# Patient Record
Sex: Female | Born: 2000
Health system: Southern US, Community
[De-identification: ages and names within clinical notes are randomized; demographics above are authoritative.]

## PROBLEM LIST (undated history)

## (undated) DIAGNOSIS — K5900 Constipation, unspecified: Secondary | ICD-10-CM

## (undated) DIAGNOSIS — R7303 Prediabetes: Secondary | ICD-10-CM

## (undated) DIAGNOSIS — E230 Hypopituitarism: Secondary | ICD-10-CM

## (undated) DIAGNOSIS — M21372 Foot drop, left foot: Secondary | ICD-10-CM

## (undated) DIAGNOSIS — E079 Disorder of thyroid, unspecified: Secondary | ICD-10-CM

## (undated) HISTORY — DX: Hypopituitarism: E23.0

## (undated) HISTORY — DX: Constipation, unspecified: K59.00

## (undated) HISTORY — DX: Foot drop, left foot: M21.372

---
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
## 2018-01-17 DIAGNOSIS — E038 Other specified hypothyroidism: Secondary | ICD-10-CM | POA: Diagnosis not present

## 2018-02-11 DIAGNOSIS — E038 Other specified hypothyroidism: Secondary | ICD-10-CM | POA: Diagnosis not present

## 2018-02-11 DIAGNOSIS — Z8349 Family history of other endocrine, nutritional and metabolic diseases: Secondary | ICD-10-CM | POA: Diagnosis not present

## 2018-03-03 DIAGNOSIS — E038 Other specified hypothyroidism: Secondary | ICD-10-CM | POA: Diagnosis not present

## 2018-04-13 DIAGNOSIS — M21372 Foot drop, left foot: Secondary | ICD-10-CM | POA: Diagnosis not present

## 2018-05-13 ENCOUNTER — Ambulatory Visit: Payer: 59 | Admitting: Neurology

## 2018-05-13 ENCOUNTER — Encounter (INDEPENDENT_AMBULATORY_CARE_PROVIDER_SITE_OTHER): Payer: 59 | Admitting: Neurology

## 2018-05-13 DIAGNOSIS — Z0289 Encounter for other administrative examinations: Secondary | ICD-10-CM

## 2018-05-13 DIAGNOSIS — M21372 Foot drop, left foot: Secondary | ICD-10-CM | POA: Diagnosis not present

## 2018-05-13 NOTE — Procedures (Signed)
Full Name: Joyce MaltaCourtney  [name] Gender: Female MRN #: [id number] Date of Birth: Aug 04, 2001    Visit Date: 05/13/2018 08:14 Age: 17 Years 2 Months Old Examining Physician: Levert FeinsteinYijun Yeira Gulden, MD  Referring Physician: Kenard Gowerabdice Smith, MD History: 17 years old female presented with left foot drop, gradual onset since July 2019, few days of left she took off her left ankle brace for left foot pain, she noticed numbness at the top of her left foot extending to left lateral leg, then followed by left foot drop, tripping, over the past few weeks, she has some improvement, she denies significant low back pain, she tends to cross her leg.  On examination: Right lower extremity proximal and distal muscle strength was normal, left leg proximal muscle strength is normal, left ankle dorsiflexion 3, eversion 2, inversion 4+, left ankle plantarflexion 4+, sensory loss of the top of left foot, extending to left lateral leg, involving first webspace.  Deep tendon reflexes were brisk and symmetric bilaterally  Summary of the tests:  Nerve conduction study: Bilateral sural, right superficial peroneal sensory responses were normal.  Left superficial peroneal sensory responses showed normal peak latency, with significantly decreased the snap amplitude. Bilateral tibial, right peroneal to EDB motor responses were normal.  Left peroneal to EDB motor responses showed normal distal latency, normal C map amplitude at distal stimulation side, but significant amplitude drop at proximal stimulation site, below and above fibular head.  Left peroneal to tibialis anterior responses also showed moderately prolonged distal latency, with moderately decreased the C map amplitude, slow conduction velocity across left fibular head.  Electromyography: Selective needle examinations were performed at left lower extremity muscles in the left lumbosacral paraspinal muscles.  There was evidence of profound active neuropathic changes involving  left tibialis anterior, peroneal longus.  There is also evidence of mild chronic neuropathic changes involving left tibialis posterior, medial gastrocnemius, biceps femoris long head, short head.  There is no evidence of active denervation at left lumbosacral paraspinal muscles.  Conclusion:  This is an abnormal study.  There is electrodiagnostic evidence of left common peroneal neuropathy, likely across left fibular head, with evidence of axonal loss.   ------------------------------- Forrestine HimYijun Orlando Devereux M.D.PhD  [facility] [address] [place], [place] [address] Tel: [phone] Fax: [phone]        [facility]    Nerve / Sites Muscle Latency Ref. Amplitude Ref. Rel Amp Segments Distance Velocity Ref. Area    ms ms mV mV %  cm m/s m/s mVms  L Peroneal - EDB     Ankle EDB 5.6 ?6.5 2.2 ?2.0 100 Ankle - EDB 9   10.8     Fib head EDB 15.1  1.0  45.2 Fib head - Ankle 33 35 ?44 5.1     Pop fossa EDB 18.4  0.7  73.3 Pop fossa - Fib head 10 30 ?44 4.2         Pop fossa - Ankle      R Peroneal - EDB     Ankle EDB 5.2 ?6.5 4.3 ?2.0 100 Ankle - EDB 9   16.3     Fib head EDB 12.0  4.1  94.5 Fib head - Ankle 30 44 ?44 16.4     Pop fossa EDB 14.2  4.2  102 Pop fossa - Fib head 10 46 ?44 17.7         Pop fossa - Ankle      L Tibial - AH  Ankle AH 4.8 ?5.8 9.9 ?4.0 100 Ankle - AH 9   33.3     Pop fossa AH 14.6  8.5  85.7 Pop fossa - Ankle 40 41 ?41 31.9  R Tibial - AH     Ankle AH 5.3 ?5.8 9.2 ?4.0 100 Ankle - AH 9   31.5     Pop fossa AH 14.7  5.3  57.4 Pop fossa - Ankle 40 42 ?41 25.8  L Peroneal - Tib Ant     Fib Head Tib Ant 8.8 ?6.7 1.2 ?3.0 100 Fib Head - Tib Ant 10   12.9     Pop fossa Tib Ant 14.9  0.8  63 Pop fossa - Fib Head 10 16 ?44 5.0               SNC    Nerve / Sites Rec. Site Peak Lat Ref.  Amp Ref. Segments Distance    ms ms V V  cm  L Sural - Ankle (Calf)     Calf Ankle 4.2 ?4.4 8 ?6 Calf - Ankle 14  R Sural - Ankle (Calf)     Calf Ankle 3.6 ?4.4 11  ?6 Calf - Ankle 14  L Superficial peroneal - Ankle     Lat leg Ankle 4.2 ?4.4 2 ?6 Lat leg - Ankle 14  R Superficial peroneal - Ankle     Lat leg Ankle 4.1 ?4.4 8 ?6 Lat leg - Ankle 14             F  Wave    Nerve F Lat Ref.   ms ms  L Tibial - AH 52.9 ?56.0  R Tibial - AH 52.4 ?56.0         EMG full       EMG Summary Table    Spontaneous MUAP Recruitment  Muscle IA Fib PSW Fasc Other Amp Dur. Poly Pattern  L. Tibialis anterior Increased None 3+ None _______ Increased Increased 2+ Reduced  L. Tibialis posterior Increased None None None _______ Normal Normal 1+ Reduced  L. Gastrocnemius (Medial head) Increased None None None _______ Normal Normal 1+ Reduced  L. Peroneus longus Increased None 3+ None _______ Increased Increased 2+ Reduced  L. Vastus lateralis Normal None None None _______ Normal Normal Normal Normal  L. Biceps femoris (short head) Normal None None None _______ Normal Normal Normal Reduced  L. Biceps femoris (long head) Normal None None None _______ Normal Normal Normal Reduced  L. Gluteus medius Normal None None None _______ Increased Increased Normal Reduced  L. Lumbar paraspinals (mid) Normal None None None _______ Normal Normal Normal Normal  L. Lumbar paraspinals (low) Normal None None None _______ Normal Normal Normal Normal

## 2018-06-07 ENCOUNTER — Encounter: Payer: Self-pay | Admitting: *Deleted

## 2018-06-08 ENCOUNTER — Encounter: Payer: Self-pay | Admitting: Neurology

## 2018-06-08 ENCOUNTER — Ambulatory Visit: Payer: 59 | Admitting: Neurology

## 2018-06-08 VITALS — BP 109/67 | HR 87 | Ht 68.75 in | Wt 172.0 lb

## 2018-06-08 DIAGNOSIS — G5702 Lesion of sciatic nerve, left lower limb: Secondary | ICD-10-CM

## 2018-06-08 NOTE — Progress Notes (Signed)
PATIENT: Joyce [name] DOB: 01-Aug-2001  Chief Complaint  Patient presents with  . Left foot drop    She is here with her father, Christia Reading, to follow up from her abnormal NCV/EMG.  . PCP    Carol Ada, MD     HISTORICAL  Joyce [name] is a 17 years old female, seen in request by her primary care physician Dr. Tamala Julian, [facility] for evaluation of left foot drop, initial evaluation was on June 08, 2018.  I have reviewed and summarized the referring note from the referring physician, she had hypothyroidism, was treated with thyroid supplement since June 2019, also suffered depression with rapid weight loss chronic constipation at that period of time  At the end of June 19, concurrent with her worsening depression, weight loss, she noticed mild left foot drop, difficulty clear her left foot from the floor, this also happened in the setting that she spent many times in her tight fit massage chair, she wear a left ankle brace sprained left ankle for a while, at the worst, she has difficulty dorsiflexion her left ankle, sensory loss at the top of left foot, lateral leg, she also has a habit of sitting with her leg crossed.   During EMG nerve conduction study on May 13, 2018, there is evidence of left ankle dorsiflexion weakness 3, left ankle eversion 2, inversion 4+, left ankle plantarflexion 4+, sensory loss at the top of left foot, extending to left lateral leg, involving first webspace.  Deep tendon reflexes were brisk and symmetric bilaterally  There is evidence of significantly decreased left superficial peroneal sensory response, decreased left peroneal to EDB and tibialis anterior motor response, slowing conduction velocity across the left fibular head.  Tenderness of left fibular head upon deep palpitation, EMG nerve conduction study also showed prominent active neuropathic changes at left tibialis anterior, peroneal longus, which support a diagnosis of left common peroneal  neuropathy.  She denies significant low back pain, no bowel and bladder incontinence, no right lower extremity involvement,  Over the past few weeks, she has regained significant recovery, she cannot ambulate 80% to her baseline, still has mild left foot drop, much improved,  I reviewed laboratory evaluations in January 2019, mild elevated WBC 14.7, hemoglobin 14.2, normal thyroid globulin antibody, thyroid peroxidase antibody, normal CMP, TSH, ACTH, progesterone, cortisone, CMP, creatinine of 0.68, ESR of 3,   REVIEW OF SYSTEMS: Full 14 system review of systems performed and notable only for as above All other review of systems were negative.  ALLERGIES: No Known Allergies  HOME MEDICATIONS: Current Outpatient Medications  Medication Sig Dispense Refill  . amitriptyline (ELAVIL) 25 MG tablet Take 25 mg by mouth daily.    . Cyanocobalamin (B-12 PO) Take 1 tablet by mouth daily.    Marland Kitchen FIBER PO Take 1 tablet by mouth daily.    Marland Kitchen levothyroxine (SYNTHROID, LEVOTHROID) 50 MCG tablet Take 50 mcg by mouth daily.  5  . polyethylene glycol (MIRALAX / GLYCOLAX) packet Take 17 g by mouth daily as needed.     No current facility-administered medications for this visit.     PAST MEDICAL HISTORY: Past Medical History:  Diagnosis Date  . Constipation   . Hypopituitarism ([place])   . Left foot drop     PAST SURGICAL HISTORY: History reviewed. No pertinent surgical history.  FAMILY HISTORY: Family History  Problem Relation Age of Onset  . Hypertension Mother   . Healthy Father   . Hypertension Paternal Grandmother   .  Diabetes Paternal Grandmother     SOCIAL HISTORY: Social History   Socioeconomic History  . Marital status: Single    Spouse name: Not on file  . Number of children: 0  . Years of education: college student now  . Highest education level: Not on file  Occupational History  . Occupation: Ship broker  Social Needs  . Financial resource strain: Not on file  . Food  insecurity:    Worry: Not on file    Inability: Not on file  . Transportation needs:    Medical: Not on file    Non-medical: Not on file  Tobacco Use  . Smoking status: Never Smoker  . Smokeless tobacco: Never Used  Substance and Sexual Activity  . Alcohol use: No  . Drug use: Never  . Sexual activity: Not on file  Lifestyle  . Physical activity:    Days per week: Not on file    Minutes per session: Not on file  . Stress: Not on file  Relationships  . Social connections:    Talks on phone: Not on file    Gets together: Not on file    Attends religious service: Not on file    Active member of club or organization: Not on file    Attends meetings of clubs or organizations: Not on file    Relationship status: Not on file  . Intimate partner violence:    Fear of current or ex partner: Not on file    Emotionally abused: Not on file    Physically abused: Not on file    Forced sexual activity: Not on file  Other Topics Concern  . Not on file  Social History Narrative   Lives at home with parents.   Right-handed.   No daily caffeine use.     PHYSICAL EXAM   Vitals:   06/08/18 0734  BP: 109/67  Pulse: 87  Weight: 172 lb (78 kg)  Height: 5' 8.75" (1.746 m)    Not recorded      Body mass index is 25.59 kg/m.  PHYSICAL EXAMNIATION:  Gen: NAD, conversant, well nourised, obese, well groomed                     Cardiovascular: Regular rate rhythm, no peripheral edema, warm, nontender. Eyes: Conjunctivae clear without exudates or hemorrhage Neck: Supple, no carotid bruits. Pulmonary: Clear to auscultation bilaterally   NEUROLOGICAL EXAM:  MENTAL STATUS: Speech:    Speech is normal; fluent and spontaneous with normal comprehension.  Cognition:     Orientation to time, place and person     Normal recent and remote memory     Normal Attention span and concentration     Normal Language, naming, repeating,spontaneous speech     Fund of knowledge   CRANIAL  NERVES: CN II: Visual fields are full to confrontation. Fundoscopic exam is normal with sharp discs and no vascular changes. Pupils are round equal and briskly reactive to light. CN III, IV, VI: extraocular movement are normal. No ptosis. CN V: Facial sensation is intact to pinprick in all 3 divisions bilaterally. Corneal responses are intact.  CN VII: Face is symmetric with normal eye closure and smile. CN VIII: Hearing is normal to rubbing fingers CN IX, X: Palate elevates symmetrically. Phonation is normal. CN XI: Head turning and shoulder shrug are intact CN XII: Tongue is midline with normal movements and no atrophy.  MOTOR: She only has mild dorsiflexion weakness 4, left eversion weakness 4  plus  REFLEXES: Reflexes are 2+ and symmetric at the biceps, triceps, knees, and ankles. Plantar responses are flexor.  SENSORY: Intact to light touch, pinprick, positional sensation and vibratory sensation are intact in fingers and toes.  COORDINATION: Rapid alternating movements and fine finger movements are intact. There is no dysmetria on finger-to-nose and heel-knee-shin.    GAIT/STANCE: She has mild left foot drop, difficulty standing up on left heel, mild tenderness at left fibular head.   DIAGNOSTIC DATA (LABS, IMAGING, TESTING) - I reviewed patient records, labs, notes, testing and imaging myself where available.   ASSESSMENT AND PLAN  Joyce [name] is a 17 y.o. female   Left common peroneal neuropathy  Most likely across left fibular head, likely related to focal compression  Overall has improved  Check B12 level     Marcial Pacas, M.D. Ph.D.  [facility] [address], [place], [place] [address] Ph: [phone] Fax: [phone]  CC: Carol Ada, MD

## 2018-06-09 ENCOUNTER — Telehealth: Payer: Self-pay | Admitting: *Deleted

## 2018-06-09 LAB — VITAMIN B12: VITAMIN B 12: 1078 pg/mL (ref 232–1245)

## 2018-06-09 NOTE — Telephone Encounter (Signed)
Left patient a detailed message, with results, on voicemail (ok per DPR).  Provided our number to call back with any questions.  

## 2018-06-09 NOTE — Telephone Encounter (Signed)
-----   Message from Levert Feinstein, MD sent at 06/09/2018 11:08 AM EDT ----- Please call patient for normal laboratory result

## 2018-07-15 DIAGNOSIS — Z23 Encounter for immunization: Secondary | ICD-10-CM | POA: Diagnosis not present

## 2018-09-05 DIAGNOSIS — Z8349 Family history of other endocrine, nutritional and metabolic diseases: Secondary | ICD-10-CM | POA: Diagnosis not present

## 2018-09-05 DIAGNOSIS — E038 Other specified hypothyroidism: Secondary | ICD-10-CM | POA: Diagnosis not present

## 2018-10-17 DIAGNOSIS — E038 Other specified hypothyroidism: Secondary | ICD-10-CM | POA: Diagnosis not present

## 2018-11-29 DIAGNOSIS — E038 Other specified hypothyroidism: Secondary | ICD-10-CM | POA: Diagnosis not present

## 2019-01-26 DIAGNOSIS — E038 Other specified hypothyroidism: Secondary | ICD-10-CM | POA: Diagnosis not present

## 2019-09-23 IMAGING — MR MR HEAD WO/W CM
15 of 19 series · 32 of 48 positions shown · IV contrast (multihance)
Comparison: None.

CLINICAL DATA: Hypopituitarism.

EXAM:
MRI HEAD WITHOUT AND WITH CONTRAST
TECHNIQUE: Multiplanar, multiecho pulse sequences of the brain and surrounding
structures were obtained without and with intravenous contrast.
CONTRAST:  10mL MULTIHANCE GADOBENATE DIMEGLUMINE 529 MG/ML IV SOLN

[Series 2: T1 · sagittal · 5.0mm · 0.45mm/px · 2 of 23 slices shown]
[im 1/23]
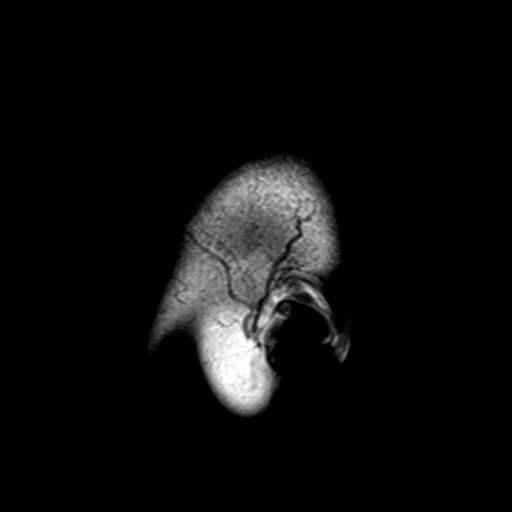
[im 23/23]
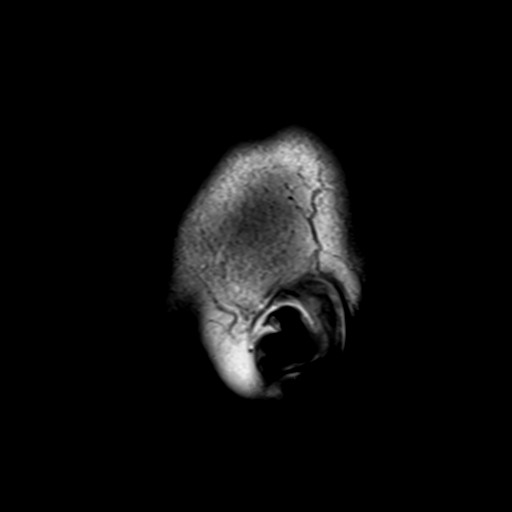

[Series 3: DWI · axial · 3.0mm · 1.80mm/px · z∈[-84,+58]mm · 8 of 100 slices shown]
[im 1/100]
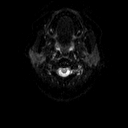
[im 12/100]
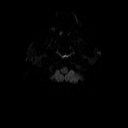
[im 34/100]
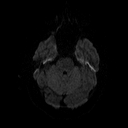
[im 45/100]
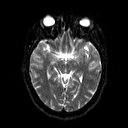
[im 56/100]
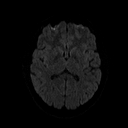
[im 67/100]
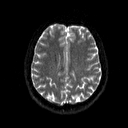
[im 89/100]
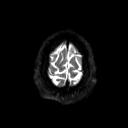
[im 100/100]
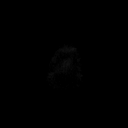

[Series 4: dwi_adc · axial · 3.0mm · 1.80mm/px · z∈[-84,+58]mm · 5 of 47 slices shown]
[im 1/47]
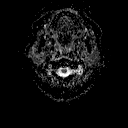
[im 12/47]
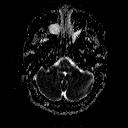
[im 24/47]
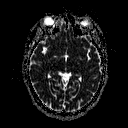
[im 35/47]
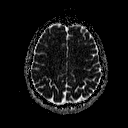
[im 47/47]
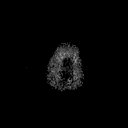

[Series 5: T2 · axial · 5.0mm · 0.36mm/px · z∈[-79,+68]mm · 2 of 24 slices shown]
[im 1/24]
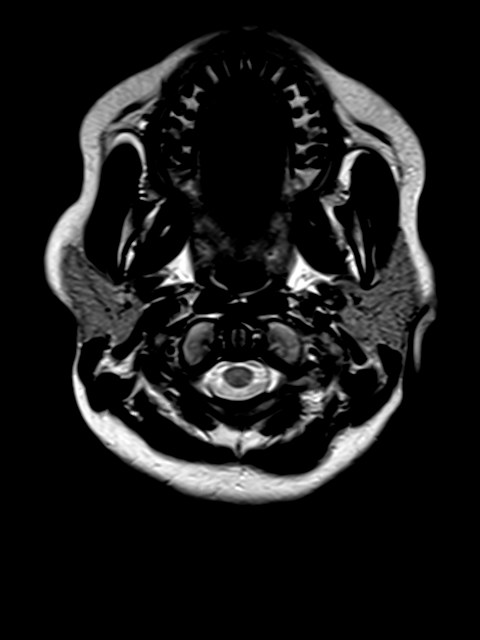
[im 24/24]
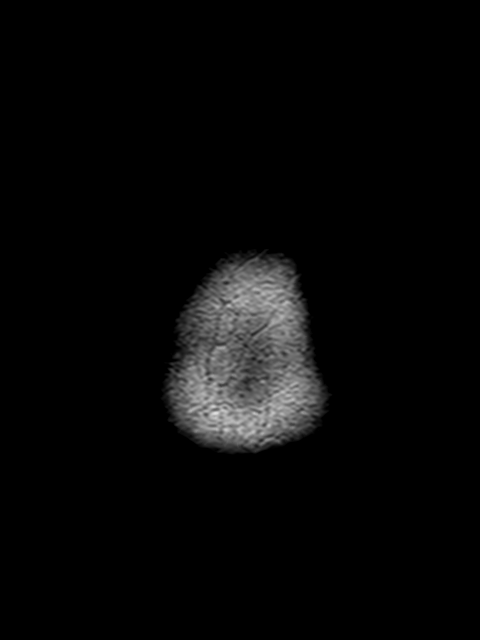

[Series 6: FLAIR · axial · 3.0mm · 0.45mm/px · z∈[-80,+66]mm · 3 of 33 slices shown]
[im 1/33]
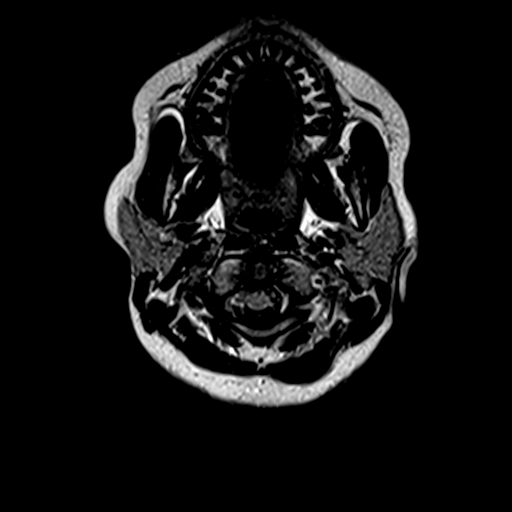
[im 17/33]
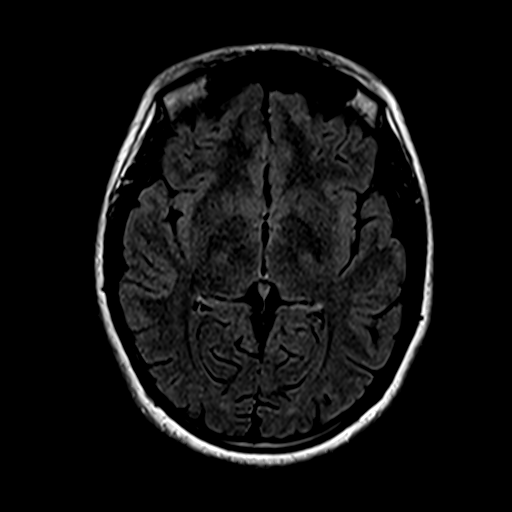
[im 33/33]
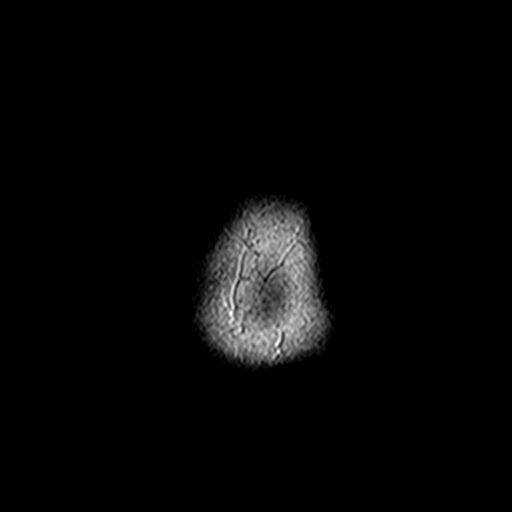

[Series 7: axial grad (blood) · axial · 5.0mm · 0.45mm/px · z∈[-79,+68]mm · 2 of 24 slices shown]
[im 1/24]
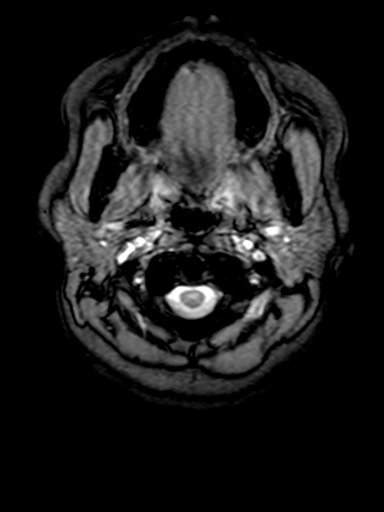
[im 24/24]
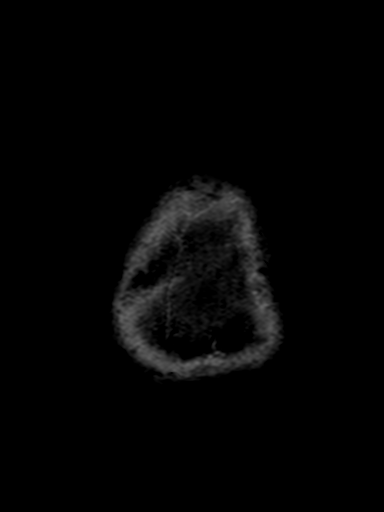

[Series 8: sag 3mm · sagittal · 3.0mm · 0.33mm/px · 1 of 14 slices shown]
[im 1/14]
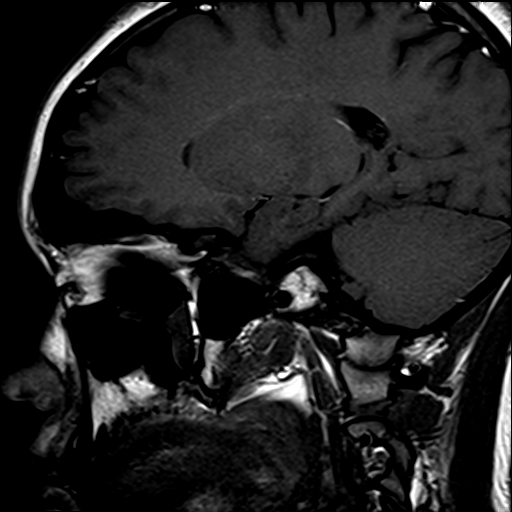

[Series 9: cor 3mm · coronal · 3.0mm · 0.33mm/px · 2 of 16 slices shown]
[im 1/16]
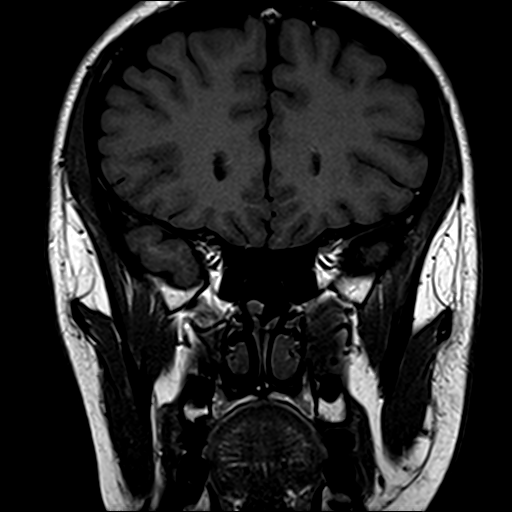
[im 16/16]
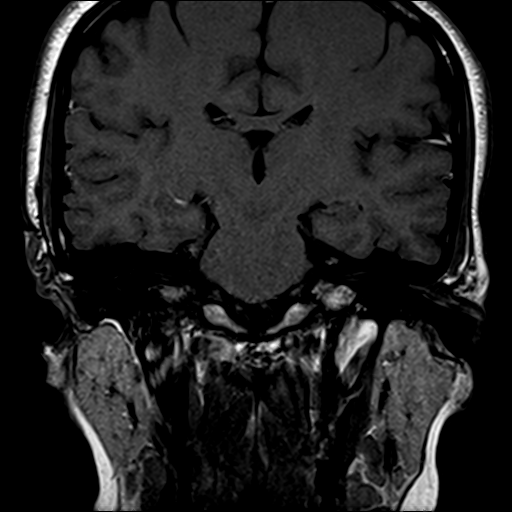

[Series 10: pre cor dynamic · coronal · non-contrast · 3.0mm · 0.35mm/px · 1 of 11 slices shown]
[im 1/11]
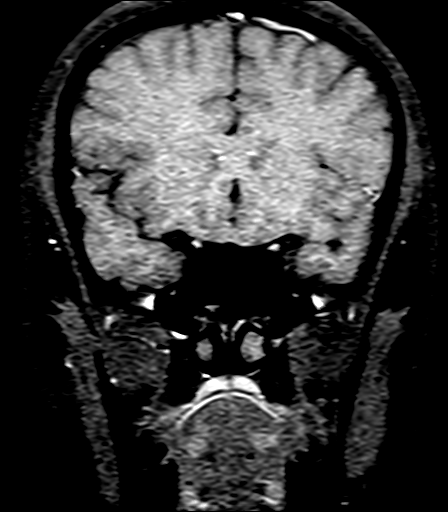

[Series 11: post fs cor · coronal · 3.0mm · 0.35mm/px · 1 of 11 slices shown (1 of 6)]
[im 1/11]
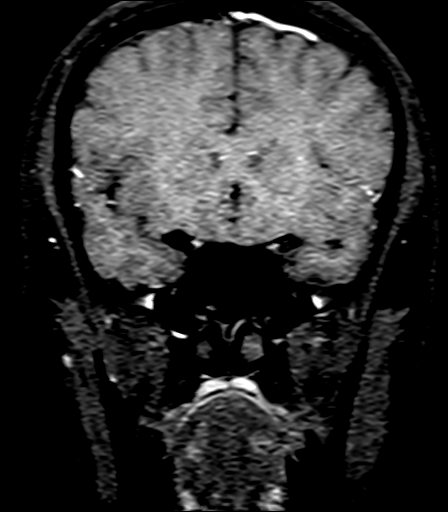

[Series 12: post fs cor · coronal · 3.0mm · 0.35mm/px · 1 of 11 slices shown (2 of 6)]
[im 1/11]
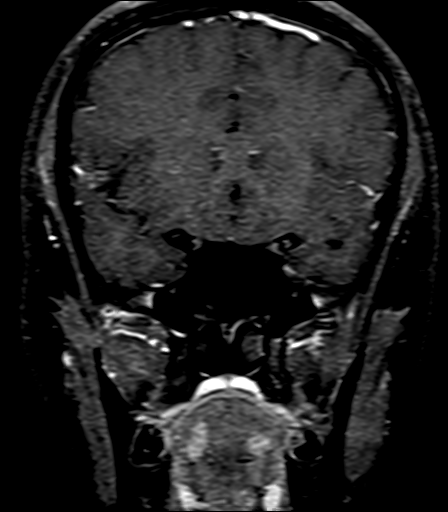

[Series 13: post fs cor · coronal · 3.0mm · 0.35mm/px · 1 of 11 slices shown (3 of 6)]
[im 1/11]
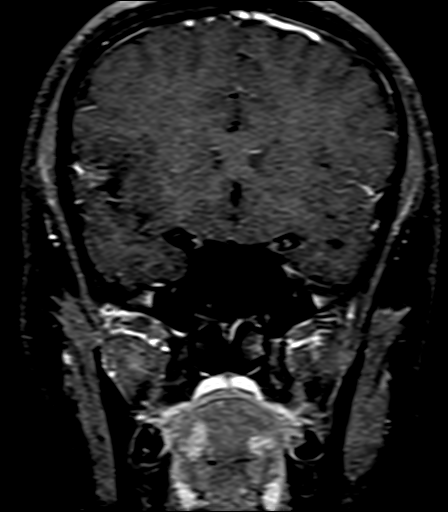

[Series 14: post fs cor · coronal · 3.0mm · 0.35mm/px · 1 of 11 slices shown (4 of 6)]
[im 1/11]
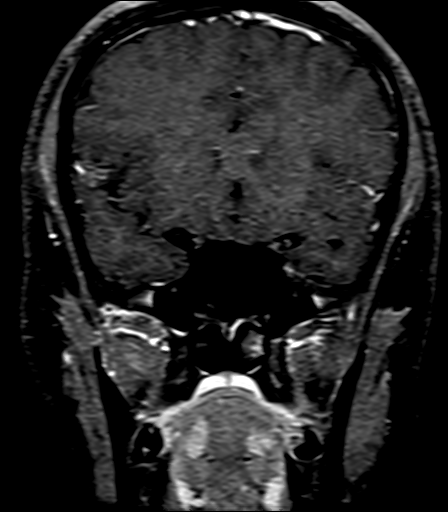

[Series 15: post fs cor · coronal · 3.0mm · 0.35mm/px · 1 of 11 slices shown (5 of 6)]
[im 1/11]
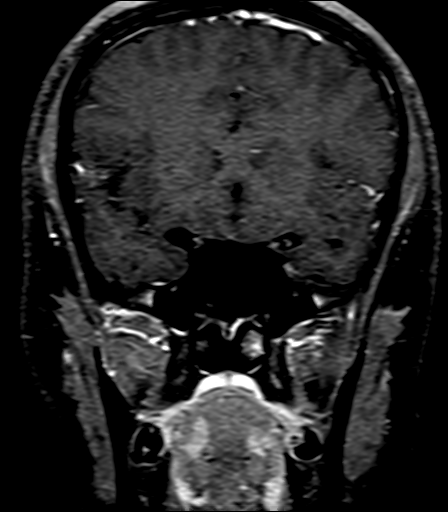

[Series 16: post fs cor · coronal · 3.0mm · 0.35mm/px · 1 of 11 slices shown (6 of 6)]
[im 1/11]
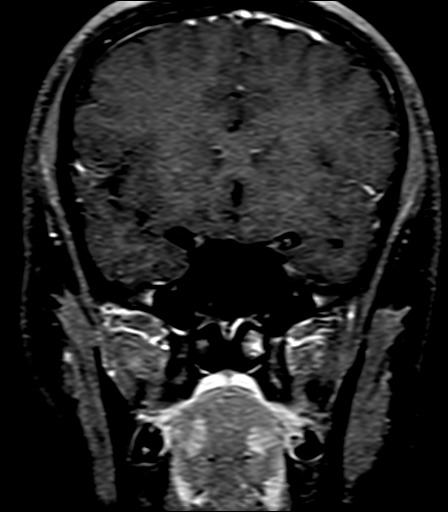

[32 of 48 positions shown; findings below may reference images not displayed]

FINDINGS: Brain: The brain itself has normal appearance without evidence of
malformation, atrophy, old or acute infarction, mass lesion,
hemorrhage, hydrocephalus or extra-axial collection. Hypothalamus
appears normal. Pituitary gland within normal limits, measuring 11 x
15 x 7 mm. Pituitary stalk appears normal. The gland enhances in a
homogeneous fashion. No abnormal brain enhancement occurs elsewhere.

Vascular: Major vessels at the base of the brain show flow.

Skull and upper cervical spine: Negative

Sinuses/Orbits: Some inflammatory changes of the maxillary and
ethmoid sinuses. Orbits negative.

Other: None
IMPRESSION: Normal appearance of the brain itself. Normal appearance of the
pituitary gland. Gland within normal limits in size. Abnormality of
the stalk or hypothalamus is seen.

Some sinus inflammatory changes in the maxillary and ethmoid
sinuses.

## 2021-03-23 ENCOUNTER — Other Ambulatory Visit: Payer: Self-pay

## 2021-03-23 ENCOUNTER — Encounter (HOSPITAL_BASED_OUTPATIENT_CLINIC_OR_DEPARTMENT_OTHER): Payer: Self-pay | Admitting: *Deleted

## 2021-03-23 ENCOUNTER — Emergency Department (HOSPITAL_BASED_OUTPATIENT_CLINIC_OR_DEPARTMENT_OTHER)
Admission: EM | Admit: 2021-03-23 | Discharge: 2021-03-23 | Disposition: A | Discharge status: Home / Self Care | Payer: 59 | Attending: Emergency Medicine | Admitting: Emergency Medicine

## 2021-03-23 DIAGNOSIS — Z79899 Other long term (current) drug therapy: Secondary | ICD-10-CM | POA: Diagnosis not present

## 2021-03-23 DIAGNOSIS — R1013 Epigastric pain: Secondary | ICD-10-CM | POA: Insufficient documentation

## 2021-03-23 DIAGNOSIS — R112 Nausea with vomiting, unspecified: Secondary | ICD-10-CM | POA: Diagnosis not present

## 2021-03-23 HISTORY — DX: Disorder of thyroid, unspecified: E07.9

## 2021-03-23 LAB — COMPREHENSIVE METABOLIC PANEL
ALT: 14 U/L (ref 0–44)
AST: 18 U/L (ref 15–41)
Albumin: 4.2 g/dL (ref 3.5–5.0)
Alkaline Phosphatase: 73 U/L (ref 38–126)
Anion gap: 7 (ref 5–15)
BUN: 11 mg/dL (ref 6–20)
CO2: 28 mmol/L (ref 22–32)
Calcium: 9.3 mg/dL (ref 8.9–10.3)
Chloride: 104 mmol/L (ref 98–111)
Creatinine, Ser: 0.63 mg/dL (ref 0.44–1.00)
GFR, Estimated: >60 mL/min (ref >60)
Glucose, Bld: 97 mg/dL (ref 70–99)
Potassium: 3.5 mmol/L (ref 3.5–5.1)
Sodium: 139 mmol/L (ref 135–145)
Total Bilirubin: 0.6 mg/dL (ref 0.3–1.2)
Total Protein: 7.2 g/dL (ref 6.5–8.1)

## 2021-03-23 LAB — CBC
HCT: 40.2 % (ref 36.0–46.0)
Hemoglobin: 12.7 g/dL (ref 12.0–15.0)
MCH: 27.5 pg (ref 26.0–34.0)
MCHC: 31.6 g/dL (ref 30.0–36.0)
MCV: 87.2 fL (ref 80.0–100.0)
Platelets: 232 10*3/uL (ref 150–400)
RBC: 4.61 MIL/uL (ref 3.87–5.11)
RDW: 15.2 % (ref 11.5–15.5)
WBC: 8.6 10*3/uL (ref 4.0–10.5)
nRBC: 0 % (ref 0.0–0.2)

## 2021-03-23 LAB — URINALYSIS, ROUTINE W REFLEX MICROSCOPIC
Bilirubin Urine: NEGATIVE
Glucose, UA: NEGATIVE mg/dL
Ketones, ur: NEGATIVE mg/dL
Nitrite: NEGATIVE
Specific Gravity, Urine: 1.034 — ABNORMAL HIGH (ref 1.005–1.030)
pH: 6 (ref 5.0–8.0)

## 2021-03-23 LAB — PREGNANCY, URINE: Preg Test, Ur: NEGATIVE

## 2021-03-23 LAB — LIPASE, BLOOD: Lipase: 22 U/L (ref 11–51)

## 2021-03-23 MED ORDER — ONDANSETRON 4 MG PO TBDP: ORAL_TABLET | ORAL | 0 refills | Status: DC

## 2021-03-23 MED ORDER — ALUM & MAG HYDROXIDE-SIMETH 200-200-20 MG/5ML PO SUSP
30.0000 mL | Freq: Once | ORAL | Status: AC
  Administered 2021-03-23: 30 mL via ORAL
  Filled 2021-03-23: qty 30

## 2021-03-23 MED ORDER — SODIUM CHLORIDE 0.9 % IV BOLUS
1000.0000 mL | Freq: Once | INTRAVENOUS | Status: AC
  Administered 2021-03-23: 1000 mL via INTRAVENOUS

## 2021-03-23 MED ORDER — MORPHINE SULFATE (PF) 4 MG/ML IV SOLN
4.0000 mg | Freq: Once | INTRAVENOUS | Status: AC
  Administered 2021-03-23: 4 mg via INTRAVENOUS
  Filled 2021-03-23: qty 1

## 2021-03-23 MED ORDER — KETOROLAC TROMETHAMINE 30 MG/ML IJ SOLN
15.0000 mg | Freq: Once | INTRAMUSCULAR | Status: AC
Start: 2021-03-23 — End: 2021-03-23
  Administered 2021-03-23: 15 mg via INTRAVENOUS
  Filled 2021-03-23: qty 1

## 2021-03-23 MED ORDER — ONDANSETRON HCL 4 MG/2ML IJ SOLN
4.0000 mg | Freq: Once | INTRAMUSCULAR | Status: AC
  Administered 2021-03-23: 4 mg via INTRAVENOUS
  Filled 2021-03-23: qty 2

## 2021-03-23 NOTE — Discharge Instructions (Addendum)
Follow up with your PCP.  Return for inability to eat or drink, persistent fever >5 days, sudden worsening abdominal pain. ° °

## 2021-03-23 NOTE — ED Provider Notes (Signed)
[facility] EMERGENCY DEPT Provider Note   CSN: [id number] Arrival date & time: 03/23/21  0813     History Chief Complaint  Patient presents with   Nausea    Joyce [name] is a 20 y.o. female.  20 yo F with a chief complaints of epigastric abdominal pain nausea and vomiting.  Started about an hour ago.  Has a history of events like this in the past.  Mom thinks they are due to the hypothyroidism.  Has recently restarted her thyroid medication.  Had levels recently checked but family is unsure of the results.  No known sick contacts no diarrhea no fevers.  The history is provided by the patient.  Illness Severity:  Moderate Onset quality:  Sudden Duration:  1 hour Timing:  Constant Progression:  Unchanged Chronicity:  Recurrent Associated symptoms: abdominal pain, nausea and vomiting   Associated symptoms: no chest pain, no congestion, no fever, no headaches, no myalgias, no rhinorrhea, no shortness of breath and no wheezing       Past Medical History:  Diagnosis Date   Constipation    Hypopituitarism (HCC)    Left foot drop    Thyroid disease     Patient Active Problem List   Diagnosis Date Noted   Common peroneal neuropathy of left lower extremity 06/08/2018    History reviewed. No pertinent surgical history.   OB History   No obstetric history on file.     Family History  Problem Relation Age of Onset   Hypertension Mother    Healthy Father    Hypertension Paternal Grandmother    Diabetes Paternal Grandmother     Social History   Tobacco Use   Smoking status: Never   Smokeless tobacco: Never  Vaping Use   Vaping Use: Never used  Substance Use Topics   Alcohol use: Yes    Comment: rarely   Drug use: Never    Home Medications Prior to Admission medications   Medication Sig Start Date End Date Taking? Authorizing Provider  amitriptyline (ELAVIL) 25 MG tablet Take 25 mg by mouth daily. 05/07/18  Yes [provider]   levothyroxine (SYNTHROID, LEVOTHROID) 50 MCG tablet Take 50 mcg by mouth daily. 04/23/18  Yes [provider]    Allergies    Patient has no known allergies.  Review of Systems   Review of Systems  Constitutional:  Negative for chills and fever.  HENT:  Negative for congestion and rhinorrhea.   Eyes:  Negative for redness and visual disturbance.  Respiratory:  Negative for shortness of breath and wheezing.   Cardiovascular:  Negative for chest pain and palpitations.  Gastrointestinal:  Positive for abdominal pain, nausea and vomiting.  Genitourinary:  Negative for dysuria and urgency.  Musculoskeletal:  Negative for arthralgias and myalgias.  Skin:  Negative for pallor and wound.  Neurological:  Negative for dizziness and headaches.   Physical Exam Updated Vital Signs Temp 97.8 F (36.6 C) (Oral)   Ht 5\' 9"  (1.753 m)   Wt 81.6 kg   LMP 03/23/2021   BMI 26.58 kg/m   Physical Exam Vitals and nursing note reviewed.  Constitutional:      General: She is not in acute distress.    Appearance: She is well-developed. She is not diaphoretic.  HENT:     Head: Normocephalic and atraumatic.  Eyes:     Pupils: Pupils are equal, round, and reactive to light.  Cardiovascular:     Rate and Rhythm: Normal rate and regular  rhythm.     Heart sounds: No murmur heard.   No friction rub. No gallop.  Pulmonary:     Effort: Pulmonary effort is normal.     Breath sounds: No wheezing or rales.  Abdominal:     General: There is no distension.     Palpations: Abdomen is soft.     Tenderness: There is abdominal tenderness (epigastric).  Musculoskeletal:        General: No tenderness.     Cervical back: Normal range of motion and neck supple.  Skin:    General: Skin is warm and dry.  Neurological:     Mental Status: She is alert and oriented to person, place, and time.  Psychiatric:        Behavior: Behavior normal.    ED Results / Procedures / Treatments   Labs (all labs  ordered are listed, but only abnormal results are displayed) Labs Reviewed  URINALYSIS, ROUTINE W REFLEX MICROSCOPIC - Abnormal; Notable for the following components:      Result Value   Specific Gravity, Urine 1.034 (*)    Hgb urine dipstick LARGE (*)    Protein, ur TRACE (*)    Leukocytes,Ua TRACE (*)    Bacteria, UA RARE (*)    All other components within normal limits  CBC  PREGNANCY, URINE  LIPASE, BLOOD  COMPREHENSIVE METABOLIC PANEL    EKG None  Radiology No results found.  Procedures Procedures   Medications Ordered in ED Medications  sodium chloride 0.9 % bolus 1,000 mL (has no administration in time range)  ondansetron (ZOFRAN) injection 4 mg (has no administration in time range)  morphine 4 MG/ML injection 4 mg (has no administration in time range)  alum & mag hydroxide-simeth (MAALOX/MYLANTA) 200-200-20 MG/5ML suspension 30 mL (has no administration in time range)    ED Course  I have reviewed the triage vital signs and the nursing notes.  Pertinent labs & imaging results that were available during my care of the patient were reviewed by me and considered in my medical decision making (see chart for details).    MDM Rules/Calculators/A&P                          20 yo F with a chief complaints of nausea and vomiting.  Started acutely less than an hour ago.  Patient is uncomfortable on my exam.  Seems less likely to be an acute hypothyroidism emergency.  Will treat pain and nausea blood work reassess.  UA with contamination from menses, no obvious infection.  Pregnancy test negative.  No leukocytosis, no significant electrolyte abnormality LFTs and lipase are unremarkable.  Feeling better, tolerating p.o. without issue.  We will discharge the patient home.  9:45 AM:  I have discussed the diagnosis/risks/treatment options with the patient and family and believe the pt to be eligible for discharge home to follow-up with PCP. We also discussed returning to the  ED immediately if new or worsening sx occur. We discussed the sx which are most concerning (e.g., sudden worsening pain, fever, inability to tolerate by mouth) that necessitate immediate return. Medications administered to the patient during their visit and any new prescriptions provided to the patient are listed below.  Medications given during this visit Medications  sodium chloride 0.9 % bolus 1,000 mL (1,000 mLs Intravenous New Bag/Given 03/23/21 0857)  ondansetron (ZOFRAN) injection 4 mg (4 mg Intravenous Given 03/23/21 0858)  morphine 4 MG/ML injection 4 mg (4 mg  Intravenous Given 03/23/21 0858)  alum & mag hydroxide-simeth (MAALOX/MYLANTA) 200-200-20 MG/5ML suspension 30 mL (30 mLs Oral Given 03/23/21 9735)     The patient appears reasonably screen and/or stabilized for discharge and I doubt any other medical condition or other [facility] requiring further screening, evaluation, or treatment in the ED at this time prior to discharge.    Final Clinical Impression(s) / ED Diagnoses Final diagnoses:  None    Rx / DC Orders ED Discharge Orders     None        Melene Plan, DO 03/23/21 0945

## 2021-03-23 NOTE — ED Triage Notes (Signed)
Patient woke up with an abdominal pain with nausea an hour ago.  Pt also is on her first day of her monthly period.

## 2021-04-01 ENCOUNTER — Ambulatory Visit (HOSPITAL_COMMUNITY)
Admission: RE | Admit: 2021-04-01 | Discharge: 2021-04-01 | Disposition: A | Discharge status: Home / Self Care | Payer: 59 | Attending: Emergency Medicine | Admitting: Emergency Medicine

## 2021-04-01 ENCOUNTER — Emergency Department (HOSPITAL_COMMUNITY)
Admission: EM | Admit: 2021-04-01 | Discharge: 2021-04-03 | Disposition: A | Discharge status: Home / Self Care | Payer: 59 | Source: Home / Self Care | Attending: Emergency Medicine | Admitting: Emergency Medicine

## 2021-04-01 ENCOUNTER — Other Ambulatory Visit: Payer: Self-pay

## 2021-04-01 ENCOUNTER — Encounter (HOSPITAL_COMMUNITY): Payer: Self-pay

## 2021-04-01 DIAGNOSIS — F332 Major depressive disorder, recurrent severe without psychotic features: Secondary | ICD-10-CM | POA: Diagnosis present

## 2021-04-01 DIAGNOSIS — R45851 Suicidal ideations: Secondary | ICD-10-CM | POA: Insufficient documentation

## 2021-04-01 DIAGNOSIS — F322 Major depressive disorder, single episode, severe without psychotic features: Secondary | ICD-10-CM

## 2021-04-01 DIAGNOSIS — Z20822 Contact with and (suspected) exposure to covid-19: Secondary | ICD-10-CM | POA: Insufficient documentation

## 2021-04-01 DIAGNOSIS — F333 Major depressive disorder, recurrent, severe with psychotic symptoms: Secondary | ICD-10-CM | POA: Diagnosis not present

## 2021-04-01 NOTE — H&P (Deleted)
  The note originally documented on this encounter has been moved the the encounter in which it belongs.  

## 2021-04-01 NOTE — H&P (Addendum)
Behavioral Health Medical Screening Exam  Disposition: Based on patient's current presentation of fleeting active/passive SI, patient's aggressive behavior earlier today on 04/01/2021 of punching a door twice and running into a door, patient's inability to contract for safety at this time, patient's father's safety concerns regarding patient returning home at this time, and patient's access to firearms (see HPI for details), believe that the patient is a potential threat to herself at this time and recommend overnight continuous assessment for the patient. Per [name], [facility] AC, [facility] is currently at capacity at this time.  Will transfer the patient to [name] for overnight observation for further safety monitoring and stabilization and patient will be reevaluated by psychiatry on 04/02/2021 while patient is in the ED to determine patient's psych disposition at the time of 04/02/2021 reevaluation.  Patient and patient's father are agreeable to overnight observation at [facility].  Provider report given to Dr. [name] and Dr. Delford FieldWright has agreed to accept the patient.  Notified Dr. Delford FieldWright of plan for psychiatry reevaluation on 04/02/2021 and also notified Dr. Delford FieldWright at that the EDP assuming care of the patient in the emergency department should place a TTS consult for the patient so that the patient will be reevaluated by psychiatry on 04/02/2021 to determine the patient's psych disposition upon 04/02/2021 reevaluation. Dr. Delford FieldWright verbalizes understanding and agreement of this plan.  Notified Dr. Delford FieldWright that patient reported that she has already taken all of her home medications today on 04/01/2021 (Lexapro 20 mg p.o. daily in the a.m. and Synthroid 100 mcg p.o. daily in the a.m.).   Joyce [name] is a 20 y.o. female with reported past psychiatric history of depression, ADHD, and learning disability who presents to [facility] as a voluntary walk-in accompanied by her father [name] for worsening depression and SI.  With patient's consent, patient accompanied by her father during patient's assessment and information was obtained from both the patient and patient's father during the assessment.  Patient's father states that the patient lost her job at [employer] 2 days ago and has been struggling with the loss of her job. Patient's father states that patient's grandmother has been trying to help the patient find a new job, but states that patient has not been taking responsibility/putting in the effort to find a new job herself.  Patient's father states that earlier today on 04/01/2021, he told the patient that the patient needed to follow-up on some of her job leads and that the patient then stood up from a seated position and became frustrated and irritated.  Patient's father states that patient then made some comments that she was trying her best and that patient's mother and father did not understand the issues that the patient was going through.  Patient's father states that the patient then punched a glass screen door in their home twice and then ran into the glass screen door.  Patient's father states that patient did not break the door/glass and patient's father and patient state that patient did not hurt/damaged her right hand upon punching the door.  Patient did not report any loss of consciousness, head injury, or any additional injuries sustained when she punched/ran into the door.  Patient denies any right hand pain or any pain in general.  When the patient is initially asked if she is experiencing SI on exam, patient states "it's hard to say because there's a lot going on in my head".  Patient does report  having fleeting passive SI and active SI over the past year.  Patient states that she has been experiencing passive and/or active SI every few weeks and she states that one of her main triggers for her SI is when she gets agitated for various reasons.  Patient states  that she has had fleeting thoughts of hurting herself and cutting herself with a knife since the beginning of June 2022.  Patient denies any history of cutting herself.  Patient and patient's father report that last month in June 2022, patient expressed active SI but that her father and stated to her father that she wanted to attempt suicide by jumping off of a building in downtown [place].  Patient denies any additional suicidal plans aside from this plan mentioned above and patient's father denies any additional SI/suicidal threats/statements or suicidal plans mentioned to him by the patient aside from the suicidal plan mentioned above.  When patient is asked a second time if she is currently experiencing SI, patient denies SI on exam.  Patient and patient's father deny any history of any past suicide attempts by the patient.  Patient denies any history of self-injurious behavior via intentionally cutting or burning herself.  Patient denies HI, AVH or delusions.  Patient denies paranoia currently on exam, but does endorse fleeting thoughts of paranoia regarding concern that someone is out to get her or harm her.  Patient does not provide further details regarding this paranoia.  Patient's father states that since 2018, patient had aspirations of wanting to be an Tree surgeon for [employer] and drew multiple characters and would keep these drawings on her bed and would look at them daily.  Patient's father states that at 1 point, patient burned his drawings and stated that she did this because her goals/aspirations were not possible, but then later redrew the characters and currently keeps the drawings on her bed and looks at these drawings daily.  Patient's father states that if someone tries to take the drawings from her, she will become agitated.  Patient reports her sleep is fair, about 6 hours per night.  Patient endorses anhedonia, as well as feelings of guilt, hopelessness, and worthlessness.  Patient also  endorses fatigue.  Patient reports poor concentration/focus, but patient and patient's father states that this is a chronic issue and that this is secondary to patient having ADHD.  Patient's father reports that patient was also diagnosed with a learning disability when she was, but he is unable to recall what specific learning disability the patient was diagnosed with.  Patient reports fluctuating appetite that alternates between increased and decreased as well as fluctuating weight loss and weight gain.  Patient unable to quantify her exact weight changes over the past few months.  Patient and patient's father deny any family history of suicide.  Patient and patient's father report that patient is currently prescribed Lexapro 20 mg p.o. daily by her PCP at [facility] physicians.  Patient and patient's father state that aside from Lexapro, patient does not take any additional psychotropic medications at this time.  Patient also takes Synthroid 100 mcg p.o. daily.  Aside from Synthroid and Lexapro, patient and patient's father state that the patient is not taking any additional home medications at this time.  Patient does not have a psychiatrist currently.  Patient is currently seeing a therapist named Oletta Lamas through [employer] in [place].  Patient states that her last appointment with Dr. Mort Sawyers was on 02/12/2021 she states that she does not currently have another  appointment scheduled with her therapist at this time.  Patient and patient's father deny any history of the patient being psychiatrically hospitalized in the past.  Patient lives in [place] ([place], [place]) with her mother and father.  Patient's father reports that there are 3 firearms in the home: 1 shotgun, one 9 mm, and an assault rifle.  Patient's father states that these firearms are in a gun safe, but patient's father states that these firearms are not locked up and that the patient technically does have access to these firearms at  this time.  Patient denies any thoughts of wanting to access these firearms or use these firearms on herself or others.  Patient denies alcohol, tobacco, or illicit drug use.  Patient is currently unemployed and not in school at this time.  Patient is unable to contract for safety at this time.  When patient is asked if she would attempt to harm herself or attempt suicide if she was to return home this evening, patient states "I don't know".  Patient's father expresses safety concerns for the patient at this time and states that he does not feel safe having the patient return home with him at this time.  Total Time spent with patient: 30 minutes  Psychiatric Specialty Exam:  Presentation  General Appearance: Well Groomed  Eye Contact:Poor; Fleeting  Speech:Clear and Coherent; Normal Rate  Speech Volume:Decreased  Handedness: No data recorded  Mood and Affect  Mood:Depressed  Affect:Congruent; Flat   Thought Process  Thought Processes:Coherent; Goal Directed; Linear  Descriptions of Associations:Intact  Orientation:Full (Time, Place and Person)  Thought Content:WDL  History of Schizophrenia/Schizoaffective disorder:No data recorded Duration of Psychotic Symptoms:No data recorded Hallucinations:Hallucinations: None  Ideas of Reference:-- (Patient endorses fleeting thoughts of paranoia, but denies paranoia currently on exam.)  Suicidal Thoughts:Suicidal Thoughts: -- (Patient denies SI currently on exam, but does endorse fleeting passive and active SI over the past year.)  Homicidal Thoughts:Homicidal Thoughts: No   Sensorium  Memory:Immediate Fair; Recent Fair; Remote Fair  Judgment:Fair  Insight:Fair   Executive Functions  Concentration:Fair  Attention Span:Fair  Recall:Fair  Fund of Knowledge:Fair  Language:Good   Psychomotor Activity  Psychomotor Activity:Psychomotor Activity: Normal   Assets  Assets:Communication Skills; Desire for Improvement;  Financial Resources/Insurance; Housing; Leisure Time; Physical Health; Social Support; Talents/Skills   Sleep  Sleep:Sleep: Fair Number of Hours of Sleep: 6    Physical Exam: Physical Exam Vitals reviewed.  Constitutional:      General: She is not in acute distress.    Appearance: She is not ill-appearing, toxic-appearing or diaphoretic.  HENT:     Head: Normocephalic and atraumatic.     Right Ear: External ear normal.     Left Ear: External ear normal.     Nose: Nose normal.  Eyes:     General:        Right eye: No discharge.        Left eye: No discharge.     Conjunctiva/sclera: Conjunctivae normal.  Cardiovascular:     Rate and Rhythm: Normal rate.  Pulmonary:     Effort: Pulmonary effort is normal. No respiratory distress.  Musculoskeletal:        General: Normal range of motion.     Cervical back: Normal range of motion.     Comments: Patient has full active non-painful ROM of all digits of her right hand. Patient denies any pain with movement of any digits of her right hand. Patient's right hand is non-tender to palpation. No swelling  noted of patient's right hand. No lacerations, lesions, drainage, erythema, or signs of infection noted on patient's right hand.   Neurological:     General: No focal deficit present.     Mental Status: She is alert and oriented to person, place, and time.     Comments: No tremor noted.   Psychiatric:        Attention and Perception: She does not perceive auditory or visual hallucinations.        Mood and Affect: Mood is depressed.        Speech: Speech normal.        Behavior: Behavior is withdrawn. Behavior is not agitated, slowed, aggressive, hyperactive or combative. Behavior is cooperative.        Thought Content: Thought content is not delusional. Thought content does not include homicidal ideation.     Comments: Affect is flat and mood congruent. When the patient is initially asked if she is experiencing SI on exam, patient states  "it's hard to say because there's a lot going on in my head".  Patient does report having fleeting passive SI and active SI over the past year.  Patient states that she has been experiencing passive and/or active SI every few weeks and she states that one of her main triggers for her SI is when she gets agitated for various reasons.  Patient states that she has had fleeting thoughts of hurting herself and cutting herself with a knife since the beginning of June 2022.  Patient denies any history of cutting herself.  Patient and patient's father report that last month in June 2022, patient expressed active SI but that her father and stated to her father that she wanted to attempt suicide by jumping off of a building in downtown [place].  Patient denies any additional suicidal plans aside from this plan mentioned above and patient's father denies any additional SI/suicidal threats/statements or suicidal plans mentioned to him by the patient aside from the suicidal plan mentioned above.  When patient is asked a second time if she is currently experiencing SI, patient denies SI on exam.Patient denies paranoia currently on exam, but does endorse fleeting thoughts of paranoia regarding concern that someone is out to get her or harm her.  Patient does not provide further details regarding this paranoia.   Review of Systems  Constitutional:  Positive for malaise/fatigue. Negative for chills, diaphoresis and fever.       Patient endorses fluctuating appetite and weight loss/gain.  HENT:  Negative for congestion.   Respiratory:  Negative for cough and shortness of breath.   Cardiovascular:  Negative for chest pain and palpitations.  Gastrointestinal:  Negative for abdominal pain, constipation, diarrhea, nausea and vomiting.  Musculoskeletal:  Negative for joint pain and myalgias.       Patient denies right hand pain.  Neurological:  Negative for dizziness and headaches.  Psychiatric/Behavioral:  Positive for  depression and suicidal ideas. Negative for hallucinations, memory loss and substance abuse. The patient is nervous/anxious.   All other systems reviewed and are negative.  Vitals: Blood pressure 106/64, pulse 72, temperature 98.6 F (37 C), resp. rate 14, last menstrual period 03/23/2021, SpO2 100 %. There is no height or weight on file to calculate BMI.  Musculoskeletal: Strength & Muscle Tone: within normal limits Gait & Station: normal Patient leans: N/A   Recommendations:  Based on my evaluation the patient does not appear to have an emergency medical condition. Based on patient's current presentation of fleeting active/passive SI,  patient's aggressive behavior earlier today on 04/01/2021 of punching a door twice and running into a door, patient's inability to contract for safety at this time, patient's father's safety concerns regarding patient returning home at this time, and patient's access to firearms (see HPI for details), believe that the patient is a potential threat to herself at this time and recommend overnight continuous assessment for the patient. Per Marzetta Board, [facility] AC, [facility] is currently at capacity at this time.  Will transfer the patient to [facility] ED for overnight observation for further safety monitoring and stabilization and patient will be reevaluated by psychiatry on 04/02/2021 while patient is in the ED to determine patient's psych disposition at the time of 04/02/2021 reevaluation.  Patient and patient's father are agreeable to overnight observation at [facility] ED.  Provider report given to Dr. [facility]elf[facility]) and Dr. Delford Field has agreed to accept the patient.  Notified Dr. Delford Field of plan for psychiatry reevaluation on 04/02/2021 and also notified Dr. Delford Field at that the EDP assuming care of the patient in the emergency department should place a TTS consult for the patient so that the patient will be reevaluated by psychiatry on 04/02/2021 to determine the  patient's psych disposition upon 04/02/2021 reevaluation. Dr. Delford Field verbalizes understanding and agreement of this plan.  Notified Dr. Delford Field that patient reported that she has already taken all of her home medications today on 04/01/2021 (Lexapro 20 mg p.o. daily in the a.m. and Synthroid 100 mcg p.o. daily in the a.m.).   Jaclyn Shaggy, PA-C 04/01/2021, 9:51 PM

## 2021-04-01 NOTE — BH Assessment (Addendum)
- Comprehensive Clinical Assessment (CCA) Note  04/01/2021 Joyce [name] [id number]  Disposition: Melbourne Abts, PA, [place] Abts, [place], recommends overnight observation. Patient was seen at [facility] as a walk-in. [facility] is at capacity. Patient sent to [facility] for overnight observation.   The patient demonstrates the following risk factors for suicide: Chronic risk factors for suicide include: psychiatric disorder of major depressive disorder . Acute risk factors for suicide include: unemployment, social withdrawal/isolation, and loss (financial, interpersonal, professional). Protective factors for this patient include: responsibility to others (children, family) and coping skills. Considering these factors, the overall suicide risk at this point appears to be moderate. Patient is not appropriate for outpatient follow up.  Flowsheet Row ED from 04/01/2021 in [facility] from 03/23/2021 in [facility]  C-SSRS RISK CATEGORY Low Risk No Risk      1:1 risk  Joyce [name] is a 20 year old female presenting voluntarily as a walk-in to [facility] due to [facility] and worsening depressive symptoms. Patient accompanied by her father Joyce [name]: [phone]) for worsening depression and SI.  With patient's consent, patient accompanied by her father during patient's assessment and information was obtained from both the patient and patient's father during the assessment. When asked about SI, patient stated "it's hard to say because there's a lot going on in my head".  PER NP, p atient does report having fleeting passive SI and active SI over the past year.  Patient states that she has been experiencing passive and/or active SI every few weeks and she states that one of her main triggers for her SI is when she gets agitated for various reasons.  Patient states that she has had fleeting thoughts of hurting herself and cutting herself with a knife since the beginning of June  2022. Patient exhibited anger outburst this AM where patient punched a glass door after getting confronted about not following through on job leads. Patient resides in [facility] ([place], [place]) with her mother and father.  Patient's father reports that there are 3 firearms in the home: 1 shotgun, one 9 mm, and an assault rifle.  Patient's father reports that these firearms are in a gun safe and not locked up. Patient denied thoughts of wanting to use firearms in the house.  Technical error unable to correct format.  Chief Complaint:  Chief Complaint  Patient presents with   Psychiatric Evaluation   Visit Diagnosis:  Major depressive disorder, recurrent, severe  CCA Screening, Triage and Referral (STR)  Patient Reported Information How did you hear about [place]? Family/Friend  What Is the Reason for Your Visit/Call Today? SI and worsening depressive symptoms  How Long Has This Been Causing You Problems? <Week  What Do You Feel Would Help You the Most Today? Treatment for Depression or other mood problem  Have You Recently Had Any Thoughts About Hurting Yourself? Yes  Are You Planning to Commit Suicide/Harm Yourself At This time? No  Have you Recently Had Thoughts About Hurting Someone Joyce [name]? No  Are You Planning to Harm Someone at This Time? No  Explanation: No data recorded  Have You Used Any Alcohol or Drugs in the Past 24 Hours? No  How Long Ago Did You Use Drugs or Alcohol? No data recorded What Did You Use and How Much? No data recorded  Do You Currently Have a Therapist/Psychiatrist? Yes  Name of Therapist/Psychiatrist: Oletta Lamas, therapist at [facility]  Have You Been Recently Discharged From Any Office Practice or Programs? No  Explanation of Discharge From Practice/Program: No data recorded  CCA Screening Triage Referral Assessment Type of Contact: Face-to-Face  Telemedicine Service Delivery:   Is this Initial or Reassessment? No data recorded Date Telepsych  consult ordered in [facility]:  No data recorded Time Telepsych consult ordered in [facility]:  No data recorded Location of Assessment: [facility]  Provider Location: [facility]  Collateral Involvement: Chidera Dearcos, father, [phone]  Does Patient Have a Court Appointed Legal Guardian? No data recorded Name and Contact of Legal Guardian: No data recorded If Minor and Not Living with Parent(s), Who has Custody? No data recorded Is CPS involved or ever been involved? Never  Is APS involved or ever been involved? Never  Patient Determined To Be At Risk for Harm To Self or Others Based on Review of Patient Reported Information or Presenting Complaint? Yes, for Self-Harm  Method: No data recorded Availability of Means: No data recorded Intent: No data recorded Notification Required: No data recorded Additional Information for Danger to Others Potential: No data recorded Additional Comments for Danger to Others Potential: No data recorded Are There Guns or Other Weapons in Your Home? No data recorded Types of Guns/Weapons: No data recorded Are These Weapons Safely Secured?                            No data recorded Who Could Verify You Are Able To Have These Secured: No data recorded Do You Have any Outstanding Charges, Pending Court Dates, Parole/Probation? No data recorded Contacted To Inform of Risk of Harm To Self or Others: Family/Significant Other:  Does Patient Present under Involuntary Commitment? No  IVC Papers Initial File Date: No data recorded  [place] of Residence: [place]  Patient Currently Receiving the Following Services: Individual Therapy; Medication Management  Determination of Need: Urgent (48 hours)  Options For Referral: Medication Management; Outpatient Therapy  CCA Biopsychosocial Patient Reported Schizophrenia/Schizoaffective Diagnosis in Past: No data recorded  Strengths: uta  Mental Health Symptoms Depression:    Worthlessness; Change in energy/activity; Increase/decrease in appetite; Difficulty Concentrating; Fatigue; Hopelessness; Tearfulness; Sleep (too much or little); Irritability   Duration of Depressive symptoms:  Duration of Depressive Symptoms: Greater than two weeks   Mania:   None   Anxiety:    Difficulty concentrating; Restlessness; Irritability; Worrying; Tension   Psychosis:   None   Duration of Psychotic symptoms:    Trauma:   None   Obsessions:   None   Compulsions:   None   Inattention:   None   Hyperactivity/Impulsivity:   None   Oppositional/Defiant Behaviors:   None   Emotional Irregularity:   None   Other Mood/Personality Symptoms:  No data recorded   Mental Status Exam Appearance and self-care  Stature:   Average   Weight:   Average weight   Clothing:   Neat/clean   Grooming:   Normal   Cosmetic use:   None   Posture/gait:   Normal   Motor activity:   Not Remarkable   Sensorium  Attention:   Normal   Concentration:   Anxiety interferes   Orientation:   X5   Recall/memory:   Defective in Recent   Affect and Mood  Affect:  No data recorded  Mood:  No data recorded  Relating  Eye contact:  No data recorded  Facial expression:  No data recorded  Attitude toward examiner:  No data recorded  Thought and Language  Speech flow: No  data recorded  Thought content:  No data recorded  Preoccupation:  No data recorded  Hallucinations:  No data recorded  Organization:  No data recorded  [employer] of Knowledge:  No data recorded  Intelligence:  No data recorded  Abstraction:  No data recorded  Judgement:  No data recorded  Reality Testing:  No data recorded  Insight:  No data recorded  Decision Making:  No data recorded  Social Functioning  Social Maturity:  No data recorded  Social Judgement:  No data recorded  Stress  Stressors:  No data recorded  Coping Ability:  No data recorded  Skill Deficits:   No data recorded  Supports:  No data recorded   Religion:   Leisure/Recreation:   Exercise/Diet:   CCA Employment/Education Employment/Work Situation: Employment / Work Situation Employment Situation: Unemployed Patient's Job has Been Impacted by Current Illness: Yes Describe how Patient's Job has Been Impacted: lost job 2 days ago, specifics unknown Has Patient ever Been in the [employer]?: No  Education: Education Is Patient Currently Attending School?: No Last Grade Completed: 13 Did You Product manager?: Yes What Type of College Degree Do you Have?: "a little" Did You Have An Individualized Education Program (IIEP): Yes Did You Have Any Difficulty At School?: Yes Patient's Education Has Been Impacted by Current Illness: Yes  CCA Family/Childhood History Family and Relationship History: Family history Marital status: Single Does patient have children?: No  Childhood History:  Childhood History By whom was/is the patient raised?: Both parents Did patient suffer any verbal/emotional/physical/sexual abuse as a child?: No Did patient suffer from severe childhood neglect?: No Has patient ever been sexually abused/assaulted/raped as an adolescent or adult?: No Was the patient ever a victim of a crime or a disaster?: No Witnessed domestic violence?: No Has patient been affected by domestic violence as an adult?: No  Child/Adolescent Assessment:   CCA Substance Use Alcohol/Drug Use: Alcohol / Drug Use Pain Medications: see MAR Prescriptions: see MAR Over the Counter: see MAR History of alcohol / drug use?: No history of alcohol / drug abuse   ASAM's:  Six Dimensions of Multidimensional Assessment  Dimension 1:  Acute Intoxication and/or Withdrawal Potential:      Dimension 2:  Biomedical Conditions and Complications:      Dimension 3:  Emotional, Behavioral, or Cognitive Conditions and Complications:     Dimension 4:  Readiness to Change:     Dimension 5:   Relapse, Continued use, or Continued Problem Potential:     Dimension 6:  Recovery/Living Environment:     ASAM Severity Score:    ASAM Recommended Level of Treatment:     Substance use Disorder (SUD)   Recommendations for Services/Supports/Treatments: Recommendations for Services/Supports/Treatments Recommendations For Services/Supports/Treatments: Individual Therapy, Medication Management  Discharge Disposition:   DSM5 Diagnoses: Patient Active Problem List   Diagnosis Date Noted   MDD (major depressive disorder), recurrent severe, without psychosis (HCC) 04/01/2021   Common peroneal neuropathy of left lower extremity 06/08/2018   Referrals to Alternative Service(s): Referred to Alternative Service(s):   Place:   Date:   Time:    Referred to Alternative Service(s):   Place:   Date:   Time:    Referred to Alternative Service(s):   Place:   Date:   Time:    Referred to Alternative Service(s):   Place:   Date:   Time:     Burnetta Sabin, [facility]

## 2021-04-01 NOTE — ED Provider Notes (Signed)
[facility]-EMERGENCY DEPT Provider Note   CSN: [id number] Arrival date & time: 04/01/21  2134     History Chief Complaint  Patient presents with   Behavioral health observation    Joyce [name] is a 20 y.o. female.  Joyce [name] was sent to [facility] emergency department from behavioral health because of lack of bed space at behavioral health urgent care.  Joyce [name] presented with suicidal ideations and an angry outburst during which time Joyce [name] punched a glass door.  Joyce [name] has been triggered by recent life stressor.  Joyce [name] was unable to contract for safety, and Joyce [name] was transferred to the emergency department for further evaluation and reassessment in the morning.  The history is provided by the patient [facility] staff).  Mental Health Problem Presenting symptoms: aggressive behavior and suicidal thoughts   Degree of incapacity (severity):  Severe Onset quality:  Gradual Duration: at least one year but worse for several days. Timing:  Constant Progression:  Worsening Chronicity:  Recurrent Context: stressful life event   Context comment:  Recent job loss Relieved by:  Nothing Worsened by:  Nothing Ineffective treatments:  None tried Associated symptoms: feelings of worthlessness and irritability   Associated symptoms: no abdominal pain and no chest pain   Risk factors comment:  Access to guns     Past Medical History:  Diagnosis Date   Constipation    Hypopituitarism (HCC)    Left foot drop    Thyroid disease     Patient Active Problem List   Diagnosis Date Noted   MDD (major depressive disorder), recurrent severe, without psychosis (HCC) 04/01/2021   Common peroneal neuropathy of left lower extremity 06/08/2018    History reviewed. No pertinent surgical history.   OB History   No obstetric history on file.     Family History  Problem Relation Age of Onset   Hypertension Mother    Healthy Father    Hypertension Paternal Grandmother    Diabetes  Paternal Grandmother     Social History   Tobacco Use   Smoking status: Never   Smokeless tobacco: Never  Vaping Use   Vaping Use: Never used  Substance Use Topics   Alcohol use: Yes    Comment: rarely   Drug use: Never    Home Medications Prior to Admission medications   Medication Sig Start Date End Date Taking? Authorizing Provider  amitriptyline (ELAVIL) 25 MG tablet Take 25 mg by mouth daily. Patient not taking: Reported on 04/01/2021 05/07/18   [provider]  escitalopram (LEXAPRO) 20 MG tablet Take 20 mg by mouth daily. 03/18/21   [provider]  levothyroxine (SYNTHROID) 100 MCG tablet Take 100 mcg by mouth every morning. 03/02/21   [provider]  levothyroxine (SYNTHROID, LEVOTHROID) 50 MCG tablet Take 50 mcg by mouth daily. Patient not taking: Reported on 04/01/2021 04/23/18   [provider]  ondansetron (ZOFRAN ODT) 4 MG disintegrating tablet 4mg  ODT q4 hours prn nausea/vomit Patient not taking: Reported on 04/01/2021 03/23/21   05/24/21, DO    Allergies    Patient has no known allergies.  Review of Systems   Review of Systems  Constitutional:  Positive for irritability. Negative for chills and fever.  HENT:  Negative for ear pain and sore throat.   Eyes:  Negative for pain and visual disturbance.  Respiratory:  Negative for cough and shortness of breath.   Cardiovascular:  Negative for chest pain and palpitations.  Gastrointestinal:  Negative for  abdominal pain and vomiting.  Genitourinary:  Negative for dysuria and hematuria.  Musculoskeletal:  Negative for arthralgias and back pain.  Skin:  Negative for color change and rash.  Neurological:  Negative for seizures and syncope.  Psychiatric/Behavioral:  Positive for suicidal ideas.   All other systems reviewed and are negative.  Physical Exam Updated Vital Signs BP 111/72 (BP Location: Left Arm)   Pulse 68   Temp 98.2 F (36.8 C) (Oral)   Resp 16   Ht 5\' 9"  (1.753 m)    Wt 82.1 kg   LMP 03/23/2021   SpO2 100%   BMI 26.73 kg/m   Physical Exam Vitals and nursing note reviewed.  HENT:     Head: Normocephalic and atraumatic.  Eyes:     General: No scleral icterus. Pulmonary:     Effort: Pulmonary effort is normal. No respiratory distress.  Musculoskeletal:     Cervical back: Normal range of motion.  Skin:    General: Skin is warm and dry.  Neurological:     General: No focal deficit present.     Mental Status: Joyce [name] is alert and oriented to person, place, and time.  Psychiatric:        Attention and Perception: Attention and perception normal.        Mood and Affect: Mood is depressed. Affect is flat.        Speech: Speech normal.        Behavior: Behavior normal.        Thought Content: Thought content normal.        Cognition and Memory: Cognition and memory normal.        Judgment: Judgment normal.    ED Results / Procedures / Treatments   Labs (all labs ordered are listed, but only abnormal results are displayed) Labs Reviewed - No data to display  EKG None  Radiology No results found.  Procedures Procedures   Medications Ordered in ED Medications - No data to display  ED Course  I have reviewed the triage vital signs and the nursing notes.  Pertinent labs & imaging results that were available during my care of the patient were reviewed by me and considered in my medical decision making (see chart for details).    MDM Rules/Calculators/A&P                           ZARAYAH LANTING was sent in for overnight observation.  Joyce [name] is not under IVC.  No labs need to be ordered.  Plan is for TTS to reassess in the morning.  Hopefully, Joyce [name] will be able to consult for safety at that point. Final Clinical Impression(s) / ED Diagnoses Final diagnoses:  Current severe episode of major depressive disorder without psychotic features without prior episode [facility])    Rx / DC Orders ED Discharge Orders     None        [facility], MD 04/01/21 2252

## 2021-04-01 NOTE — ED Triage Notes (Signed)
Pt came over from behavioral health; supposed to be an overnight observation.

## 2021-04-02 LAB — RESP PANEL BY RT-PCR (FLU A&B, COVID) ARPGX2
Influenza A by PCR: NEGATIVE
Influenza B by PCR: NEGATIVE
SARS Coronavirus 2 by RT PCR: NEGATIVE

## 2021-04-02 MED ORDER — LEVOTHYROXINE SODIUM 100 MCG PO TABS
100.0000 ug | ORAL_TABLET | Freq: Every morning | ORAL | Status: DC
  Administered 2021-04-02: 100 ug via ORAL
  Filled 2021-04-02: qty 1

## 2021-04-02 MED ORDER — ACETAMINOPHEN 500 MG PO TABS
1000.0000 mg | ORAL_TABLET | Freq: Four times a day (QID) | ORAL | Status: DC | PRN
  Administered 2021-04-02: 1000 mg via ORAL
  Filled 2021-04-02: qty 2

## 2021-04-02 MED ORDER — ESCITALOPRAM OXALATE 10 MG PO TABS
20.0000 mg | ORAL_TABLET | Freq: Every day | ORAL | Status: DC
  Administered 2021-04-02: 20 mg via ORAL
  Filled 2021-04-02: qty 2

## 2021-04-02 NOTE — ED Notes (Signed)
Patient changed into burgandy scrubs. Patient has ONE blue backpack and ONE patient belongings bag placed at the nurse station across from room 18.

## 2021-04-02 NOTE — BH Assessment (Addendum)
Disposition:   Patient is accepted to [facility]/ Room #401-1 by Nira Conn, PA-C. The attending provider is Dr. Mason Jim. Nurse report [phone]. Requested nursing to have patient sign voluntary from prior to transfer. Fax form to #[phone]. Send original form with patient. Arrange patient transport via Psychologist, educational. [facility] is ready to accept patient at this time.   Notified EDP Lorre Nick, MD) and nursing (Shanon, RN) of patient's disposition.

## 2021-04-02 NOTE — ED Notes (Signed)
Completing TTS at this time.
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
## 2021-04-02 NOTE — ED Notes (Signed)
Patient wanded by security. 

## 2021-04-02 NOTE — BH Assessment (Addendum)
Disposition:   Per Dorena Bodo, RN, patient meets criteria for inpatient psychiatric treatment. @2119 , requested [facility] AC to review patient for admission to [facility].

## 2021-04-02 NOTE — ED Notes (Signed)
Pt requesting home medications, Pfeiffer, MD messaged and seen. No new orders at this time.

## 2021-04-03 ENCOUNTER — Encounter (HOSPITAL_COMMUNITY): Payer: Self-pay | Admitting: Emergency Medicine

## 2021-04-03 ENCOUNTER — Inpatient Hospital Stay (HOSPITAL_COMMUNITY)
Admission: RE | Admit: 2021-04-03 | Discharge: 2021-04-08 | DRG: 885 | Disposition: A | Discharge status: Home / Self Care | Payer: 59 | Source: Other Acute Inpatient Hospital | Attending: Psychiatry | Admitting: Psychiatry

## 2021-04-03 ENCOUNTER — Other Ambulatory Visit: Payer: Self-pay

## 2021-04-03 DIAGNOSIS — K219 Gastro-esophageal reflux disease without esophagitis: Secondary | ICD-10-CM | POA: Diagnosis present

## 2021-04-03 DIAGNOSIS — F909 Attention-deficit hyperactivity disorder, unspecified type: Secondary | ICD-10-CM | POA: Diagnosis present

## 2021-04-03 DIAGNOSIS — Z79899 Other long term (current) drug therapy: Secondary | ICD-10-CM | POA: Diagnosis not present

## 2021-04-03 DIAGNOSIS — F419 Anxiety disorder, unspecified: Secondary | ICD-10-CM | POA: Diagnosis present

## 2021-04-03 DIAGNOSIS — R45851 Suicidal ideations: Secondary | ICD-10-CM | POA: Diagnosis present

## 2021-04-03 DIAGNOSIS — Z20822 Contact with and (suspected) exposure to covid-19: Secondary | ICD-10-CM | POA: Diagnosis present

## 2021-04-03 DIAGNOSIS — F333 Major depressive disorder, recurrent, severe with psychotic symptoms: Principal | ICD-10-CM | POA: Diagnosis present

## 2021-04-03 DIAGNOSIS — K5900 Constipation, unspecified: Secondary | ICD-10-CM | POA: Diagnosis present

## 2021-04-03 DIAGNOSIS — Z79890 Hormone replacement therapy (postmenopausal): Secondary | ICD-10-CM | POA: Diagnosis not present

## 2021-04-03 DIAGNOSIS — G4700 Insomnia, unspecified: Secondary | ICD-10-CM | POA: Diagnosis present

## 2021-04-03 DIAGNOSIS — F332 Major depressive disorder, recurrent severe without psychotic features: Secondary | ICD-10-CM | POA: Diagnosis present

## 2021-04-03 DIAGNOSIS — E039 Hypothyroidism, unspecified: Secondary | ICD-10-CM | POA: Diagnosis present

## 2021-04-03 LAB — URINALYSIS, COMPLETE (UACMP) WITH MICROSCOPIC
Bilirubin Urine: NEGATIVE
Glucose, UA: NEGATIVE mg/dL
Hgb urine dipstick: NEGATIVE
Ketones, ur: NEGATIVE mg/dL
Nitrite: NEGATIVE
Protein, ur: NEGATIVE mg/dL
Specific Gravity, Urine: 1.01 (ref 1.005–1.030)
pH: 6 (ref 5.0–8.0)

## 2021-04-03 LAB — LIPID PANEL
Cholesterol: 119 mg/dL (ref 0–200)
HDL: 59 mg/dL (ref >40)
LDL Cholesterol: 52 mg/dL (ref 0–99)
Total CHOL/HDL Ratio: 2 RATIO
Triglycerides: 42 mg/dL (ref <150)
VLDL: 8 mg/dL (ref 0–40)

## 2021-04-03 LAB — TSH: TSH: 0.275 u[IU]/mL — ABNORMAL LOW (ref 0.350–4.500)

## 2021-04-03 LAB — PREGNANCY, URINE: Preg Test, Ur: NEGATIVE

## 2021-04-03 MED ORDER — ACETAMINOPHEN 500 MG PO TABS
1000.0000 mg | ORAL_TABLET | Freq: Four times a day (QID) | ORAL | Status: DC | PRN
  Administered 2021-04-03 – 2021-04-06 (×2): 1000 mg via ORAL
  Filled 2021-04-03 (×2): qty 2

## 2021-04-03 MED ORDER — TRAZODONE HCL 50 MG PO TABS
50.0000 mg | ORAL_TABLET | Freq: Every evening | ORAL | Status: DC | PRN
Start: 2021-04-03 — End: 2021-04-08

## 2021-04-03 MED ORDER — MAGNESIUM HYDROXIDE 400 MG/5ML PO SUSP: 30.0000 mL | Freq: Every day | ORAL | Status: DC | PRN

## 2021-04-03 MED ORDER — FLUOXETINE HCL 20 MG PO CAPS
20.0000 mg | ORAL_CAPSULE | Freq: Every day | ORAL | Status: DC
  Administered 2021-04-04 – 2021-04-08 (×5): 20 mg via ORAL
  Filled 2021-04-03 (×6): qty 1

## 2021-04-03 MED ORDER — FLUOXETINE HCL 10 MG PO CAPS
10.0000 mg | ORAL_CAPSULE | Freq: Every day | ORAL | Status: AC
  Administered 2021-04-03: 10 mg via ORAL
  Filled 2021-04-03 (×2): qty 1

## 2021-04-03 MED ORDER — LEVOTHYROXINE SODIUM 100 MCG PO TABS
100.0000 ug | ORAL_TABLET | Freq: Every morning | ORAL | Status: DC
  Administered 2021-04-03 – 2021-04-08 (×6): 100 ug via ORAL
  Filled 2021-04-03 (×8): qty 1

## 2021-04-03 MED ORDER — HYDROXYZINE HCL 25 MG PO TABS
25.0000 mg | ORAL_TABLET | Freq: Three times a day (TID) | ORAL | Status: DC | PRN
Start: 2021-04-03 — End: 2021-04-08
  Administered 2021-04-03 – 2021-04-05 (×3): 25 mg via ORAL
  Filled 2021-04-03 (×3): qty 1

## 2021-04-03 MED ORDER — ALUM & MAG HYDROXIDE-SIMETH 200-200-20 MG/5ML PO SUSP
30.0000 mL | ORAL | Status: DC | PRN
Start: 2021-04-03 — End: 2021-04-08
  Administered 2021-04-06: 30 mL via ORAL
  Filled 2021-04-03: qty 30

## 2021-04-03 MED ORDER — ESCITALOPRAM OXALATE 20 MG PO TABS
20.0000 mg | ORAL_TABLET | Freq: Every day | ORAL | Status: DC
  Administered 2021-04-03: 20 mg via ORAL
  Filled 2021-04-03 (×2): qty 1
  Filled 2021-04-03: qty 2

## 2021-04-03 MED ORDER — ESCITALOPRAM OXALATE 10 MG PO TABS
10.0000 mg | ORAL_TABLET | Freq: Every day | ORAL | Status: DC
  Administered 2021-04-04: 10 mg via ORAL
  Filled 2021-04-03 (×2): qty 1

## 2021-04-03 MED ORDER — FLUOXETINE HCL 10 MG PO CAPS: 10.0000 mg | ORAL_CAPSULE | Freq: Every day | ORAL | Status: DC

## 2021-04-03 NOTE — Progress Notes (Signed)
Pt given cup for urine specimen and instructed to provide a sample as soon as she is able; pt verbalized understanding.

## 2021-04-03 NOTE — ED Notes (Signed)
Safe transport called to transfer patient to [facility] rm 401
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
## 2021-04-03 NOTE — Tx Team (Signed)
Initial Treatment Plan 04/03/2021 4:23 AM [name] MHD:[id number]    PATIENT STRESSORS: Health problems   PATIENT STRENGTHS: Supportive family/friends   PATIENT IDENTIFIED PROBLEMS: "I snapped at my Mum and have been feeling like falling off a building"  Depression  "I lost my Job a few days ago"                 DISCHARGE CRITERIA:  Ability to meet basic life and health needs Improved stabilization in mood, thinking, and/or behavior Motivation to continue treatment in a less acute level of care Need for constant or close observation no longer present Reduction of life-threatening or endangering symptoms to within safe limits Verbal commitment to aftercare and medication compliance  PRELIMINARY DISCHARGE PLAN: Outpatient therapy Return to previous living arrangement Return to previous work or school arrangements  PATIENT/FAMILY INVOLVEMENT: This treatment plan has been presented to and reviewed with the patient, Joyce [name].  The patient and family have been given the opportunity to ask questions and make suggestions.  Margarita Rana, RN 04/03/2021, 4:23 AM

## 2021-04-03 NOTE — B[facility] Group Notes (Signed)
[facility] Group Notes:  (Nursing/MHT/Case Management/Adjunct)  Date:  04/03/2021  Time:  10:27 AM  Type of Therapy:  Group Therapy  Participation Level:  Minimal  Participation Quality:  Resistant  Affect:  Anxious, Blunted, and Depressed  Cognitive:  Lacking  Insight:  Lacking  Engagement in Group:  Lacking  Modes of Intervention:  Discussion  Summary of Progress/Problems:pt very withdrawn and sad appearing in group. Stated she is anxious and sad and her goal is to be better. She would not look up during group, spoke in soft tone.  Malva Limes 04/03/2021, 10:27 AM

## 2021-04-03 NOTE — Progress Notes (Signed)
Pt has been alert and oriented to person, place, time and situation. Pt is calm, cooperative, makes poor eye contact, reports passive SI without plan or intent and contracts for safety verbally to come inform staff if that changes. Pt denies homicidal ideation, denies hallucinations, denies anxiety, reports significant feelings of depression and is tearful at times. Pt has been isolating in her room often resting quietly in bed. Pt did not go to lunch but went to the cafeteria for dinner with peers and staff. Pt is medication complaint. Will continue to monitor pt per Q15 minute face checks and monitor for safety and progress.

## 2021-04-03 NOTE — B[facility] Group Notes (Signed)
[facility] Group Notes:  (Nursing/MHT/Case Management/Adjunct)  Date:  04/03/2021  Time:  8:52 PM  Type of Therapy:  Group Therapy  Participation Level:  Active  Participation Quality:  Attentive  Affect:  Anxious  Cognitive:  Appropriate  Insight:  Appropriate  Engagement in Group:  Developing/Improving  Modes of Intervention:  Discussion  Summary of Progress/Problems:  Lorita Officer 04/03/2021, 8:52 PM
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
## 2021-04-04 LAB — CBC WITH DIFFERENTIAL/PLATELET
Abs Immature Granulocytes: 0.03 10*3/uL (ref 0.00–0.07)
Basophils Absolute: 0.1 10*3/uL (ref 0.0–0.1)
Basophils Relative: 1 %
Eosinophils Absolute: 0.1 10*3/uL (ref 0.0–0.5)
Eosinophils Relative: 1 %
HCT: 44.4 % (ref 36.0–46.0)
Hemoglobin: 13.9 g/dL (ref 12.0–15.0)
Immature Granulocytes: 0 %
Lymphocytes Relative: 40 %
Lymphs Abs: 4.4 10*3/uL — ABNORMAL HIGH (ref 0.7–4.0)
MCH: 28.1 pg (ref 26.0–34.0)
MCHC: 31.3 g/dL (ref 30.0–36.0)
MCV: 89.7 fL (ref 80.0–100.0)
Monocytes Absolute: 0.6 10*3/uL (ref 0.1–1.0)
Monocytes Relative: 6 %
Neutro Abs: 5.8 10*3/uL (ref 1.7–7.7)
Neutrophils Relative %: 52 %
Platelets: 273 10*3/uL (ref 150–400)
RBC: 4.95 MIL/uL (ref 3.87–5.11)
RDW: 15 % (ref 11.5–15.5)
WBC: 11 10*3/uL — ABNORMAL HIGH (ref 4.0–10.5)
nRBC: 0 % (ref 0.0–0.2)

## 2021-04-04 LAB — COMPREHENSIVE METABOLIC PANEL
ALT: 22 U/L (ref 0–44)
AST: 25 U/L (ref 15–41)
Albumin: 4.1 g/dL (ref 3.5–5.0)
Alkaline Phosphatase: 79 U/L (ref 38–126)
Anion gap: 6 (ref 5–15)
BUN: 8 mg/dL (ref 6–20)
CO2: 28 mmol/L (ref 22–32)
Calcium: 9.3 mg/dL (ref 8.9–10.3)
Chloride: 103 mmol/L (ref 98–111)
Creatinine, Ser: 0.71 mg/dL (ref 0.44–1.00)
GFR, Estimated: >60 mL/min (ref >60)
Glucose, Bld: 90 mg/dL (ref 70–99)
Potassium: 3.7 mmol/L (ref 3.5–5.1)
Sodium: 137 mmol/L (ref 135–145)
Total Bilirubin: 0.6 mg/dL (ref 0.3–1.2)
Total Protein: 7.6 g/dL (ref 6.5–8.1)

## 2021-04-04 LAB — HEMOGLOBIN A1C
Hgb A1c MFr Bld: 5.7 % — ABNORMAL HIGH (ref 4.8–5.6)
Mean Plasma Glucose: 117 mg/dL

## 2021-04-04 MED ORDER — ESCITALOPRAM OXALATE 5 MG PO TABS
5.0000 mg | ORAL_TABLET | Freq: Every day | ORAL | Status: AC
  Administered 2021-04-05: 5 mg via ORAL
  Filled 2021-04-04: qty 1

## 2021-04-04 NOTE — Progress Notes (Signed)
D:  Pt presents with depression 7/10 and appear sad.  Pt later reports mildly anxious.  Administered PRN Vistaril per MAR per pt request.  Pt denies SI/HI, but verbally contracts to contact me or staff should the thoughts arise.  Pt denies AVH. Pt reports having "thoughts only of self doubt and downing myself."  A:  Labs/Vitals monitored; Medication provided; Pt encouraged to communicate concerns.  R:  Pt remains safe on unit with Q15 min safety checks.  Will continue POC.

## 2021-04-04 NOTE — Progress Notes (Signed)
Recreation Therapy Notes  Date:  7.22.22 Time: 0930 Location: [address] Dayroom  Group Topic: Stress Management  Goal Area(s) Addresses:  Patient will identify positive stress management techniques. Patient will identify benefits of using stress management post d/c.  Behavioral Response: Attentive  Intervention: Stress Management  Activity :  Meditation.  LRT played a meditation that focused on setting personal boundaries even if that makes others upset or uncomfortable.  Education:  Stress Management, Discharge Planning.   Education Outcome: Acknowledges Education  Clinical Observations/Feedback: Pt attended and participated in activity.  Pt expressed no concerns.     Chiquetta Langner, LRT/CTRS         Senica Crall A 04/04/2021 11:49 AM 

## 2021-04-04 NOTE — B[facility] Group Notes (Signed)
Type of Group and Topic: Psychoeducational Group: Discharge Planning   Participation Level: Did not attend    Description of Group   Discharge planning group reviews patient's anticipated discharge plans and assists patients to anticipate and address any barriers to wellness/recovery in the community. Suicide prevention education is reviewed with patients in group.   Therapeutic Goals   1. Patients will state their anticipated discharge plan and mental health aftercare   2. Patients will identify potential barriers to wellness in the community setting   3. Patients will engage in problem solving, solution focused discussion of ways to anticipate and address barriers to wellness/recovery   Summary of Patient Progress: Did not attend  

## 2021-04-04 NOTE — B[facility] Suicide Risk Assessment (Signed)
B[facility] INPATIENT:  Family/Significant Other Suicide Prevention Education  Suicide Prevention Education:  Education Completed; Trinnity Breunig [phone] (Father) has been identified by the patient as the family member/significant other with whom the patient will be residing, and identified as the person(s) who will aid the patient in the event of a mental health crisis (suicidal ideations/suicide attempt).  With written consent from the patient, the family member/significant other has been provided the following suicide prevention education, prior to the and/or following the discharge of the patient.  The suicide prevention education provided includes the following: Suicide risk factors Suicide prevention and interventions National Suicide Hotline telephone number [facility] assessment telephone number [facility] Emergency Assistance 911 [facility] and/or Residential Mobile Crisis Unit telephone number  Request made of family/significant other to: Remove weapons (e.g., guns, rifles, knives), all items previously/currently identified as safety concern.   Remove drugs/medications (over-the-counter, prescriptions, illicit drugs), all items previously/currently identified as a safety concern.  The family member/significant other verbalizes understanding of the suicide prevention education information provided.  The family member/significant other agrees to remove the items of safety concern listed above.  CSW spoke with Mr. Lovins who states that his daughter has "had a hard time transitioning into adulthood".  Mr. Wheeling states that his daughter wanted to work for [employer] and draw cartoons for them.  He states that she has drawn pictures to send to them but will not send them and will not agree to go to school to learn the skill to get a job there.  He states that if he attempts to move the pictures she becomes very upset.  Mr. Tays states that his daughter also has conflict with her  mother and sibling over there religion and does not have much contact with them.  He states that his daughter lost her job 2 days before being admitted to the hospital.  Mr. Thielke states that his daughter also has a difficult time managing her stress.  Mr. Cangemi states that his daughter has shown depressive symptoms for the past 8 years but has only been diagnosed with ADHD in the 5th grade.  Mr. Haberl confirms that his daughter lives with him and that she can come home after discharge.  He states that he and his current wife are home most of the time and can watch her.  He also states that there are firearms in the home but that they are locked up and his daughter cannot get into them.  Mr. Doubek states that his daughter has [employer] and that he will be bringing the information to the hospital today.  Mr. Ikner also states that his daughter shared with her mother yesterday that her roommate at the hospital shares the same religion as her mother.  Mr. Prada states that this could be triggering to his daughter and would like the staff to keep an eye on it.  CSW completed SPE with Mr. Krahn.   Metro Kung Bitania Shankland 04/04/2021, 11:13 AM

## 2021-04-04 NOTE — Plan of Care (Signed)
  Problem: Education: Goal: Emotional status will improve Outcome: Not Progressing   Problem: Activity: Goal: Imbalance in normal sleep/wake cycle will improve Outcome: Not Progressing   Problem: Coping: Goal: Coping ability will improve Outcome: Not Progressing
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
## 2021-04-04 NOTE — Progress Notes (Signed)
Adult Psychoeducational Group Note  Date:  04/04/2021 Time:  11:40 AM  Group Topic/Focus:  Goals Group:   The focus of this group is to help patients establish daily goals to achieve during treatment and discuss how the patient can incorporate goal setting into their daily lives to aide in recovery.  Participation Level:  Did Not Attend  Deforest [facility] 04/04/2021, 11:40 AM

## 2021-04-04 NOTE — Progress Notes (Signed)
     04/03/21 2212  Psych Admission Type (Psych Patients Only)  Admission Status Voluntary  Psychosocial Assessment  Patient Complaints Depression;Sadness  Eye Contact Fair  Facial Expression Flat  Affect Depressed  Speech Unremarkable  Interaction Forwards little;Guarded  Motor Activity Other (Comment) (WDL)  Appearance/Hygiene Unremarkable  Behavior Characteristics Cooperative;Calm  Mood Sad;Pleasant  Thought Process  Coherency Circumstantial  Content WDL  Delusions None reported or observed  Perception WDL  Hallucination None reported or observed  Judgment Impaired  Confusion None  Danger to Self  Current suicidal ideation? Denies  Danger to Others  Danger to Others None reported or observed

## 2021-04-04 NOTE — Tx Team (Signed)
Interdisciplinary Treatment and Diagnostic Plan Update  04/04/2021 Time of Session: 9:10am [name] MRN: [id number]  Principal Diagnosis: Severe recurrent major depression without psychotic features [facility])  Secondary Diagnoses: Principal Problem:   Severe recurrent major depression without psychotic features ([place])   Current Medications:  Current Facility-Administered Medications  Medication Dose Route Frequency Provider Last Rate Last Admin   acetaminophen (TYLENOL) tablet 1,000 mg  1,000 mg Oral Q6H PRN Lindon Romp A, NP   1,000 mg at 04/03/21 1617   alum & mag hydroxide-simeth (MAALOX/MYLANTA) 200-200-20 MG/5ML suspension 30 mL  30 mL Oral Q4H PRN Lindon Romp A, NP       escitalopram (LEXAPRO) tablet 10 mg  10 mg Oral Daily Nelda Marseille, Amy E, MD   10 mg at 04/04/21 0759   FLUoxetine (PROZAC) capsule 20 mg  20 mg Oral Daily Nelda Marseille, Amy E, MD   20 mg at 04/04/21 0759   hydrOXYzine (ATARAX/VISTARIL) tablet 25 mg  25 mg Oral TID PRN Rozetta Nunnery, NP   25 mg at 04/03/21 2212   levothyroxine (SYNTHROID) tablet 100 mcg  100 mcg Oral q morning Lindon Romp A, NP   100 mcg at 04/04/21 0558   magnesium hydroxide (MILK OF MAGNESIA) suspension 30 mL  30 mL Oral Daily PRN Rozetta Nunnery, NP       traZODone (DESYREL) tablet 50 mg  50 mg Oral QHS PRN Rozetta Nunnery, NP       PTA Medications: Medications Prior to Admission  Medication Sig Dispense Refill Last Dose   escitalopram (LEXAPRO) 20 MG tablet Take 20 mg by mouth daily.   04/01/2021   levothyroxine (SYNTHROID) 100 MCG tablet Take 100 mcg by mouth every morning.   04/01/2021    Patient Stressors: Health problems  Patient Strengths: Supportive family/friends  Treatment Modalities: Medication Management, Group therapy, Case management,  1 to 1 session with clinician, Psychoeducation, Recreational therapy.   Physician Treatment Plan for Primary Diagnosis: Severe recurrent major depression without psychotic features ([place]) Long  Term Goal(s):     Short Term Goals:    Medication Management: Evaluate patient's response, side effects, and tolerance of medication regimen.  Therapeutic Interventions: 1 to 1 sessions, Unit Group sessions and Medication administration.  Evaluation of Outcomes: Not Met  Physician Treatment Plan for Secondary Diagnosis: Principal Problem:   Severe recurrent major depression without psychotic features ([place])  Long Term Goal(s):     Short Term Goals:       Medication Management: Evaluate patient's response, side effects, and tolerance of medication regimen.  Therapeutic Interventions: 1 to 1 sessions, Unit Group sessions and Medication administration.  Evaluation of Outcomes: Not Met   RN Treatment Plan for Primary Diagnosis: Severe recurrent major depression without psychotic features ([place]) Long Term Goal(s): Knowledge of disease and therapeutic regimen to maintain health will improve  Short Term Goals: Ability to participate in decision making will improve, Ability to verbalize feelings will improve, and Ability to identify and develop effective coping behaviors will improve  Medication Management: RN will administer medications as ordered by provider, will assess and evaluate patient's response and provide education to patient for prescribed medication. RN will report any adverse and/or side effects to prescribing provider.  Therapeutic Interventions: 1 on 1 counseling sessions, Psychoeducation, Medication administration, Evaluate responses to treatment, Monitor vital signs and CBGs as ordered, Perform/monitor CIWA, COWS, AIMS and Fall Risk screenings as ordered, Perform wound care treatments as ordered.  Evaluation of Outcomes: Not Met   LCSW Treatment  Plan for Primary Diagnosis: Severe recurrent major depression without psychotic features ([place]) Long Term Goal(s): Safe transition to appropriate next level of care at discharge, Engage patient in therapeutic group addressing  interpersonal concerns.  Short Term Goals: Engage patient in aftercare planning with referrals and resources, Increase social support, and Increase ability to appropriately verbalize feelings  Therapeutic Interventions: Assess for all discharge needs, 1 to 1 time with Social worker, Explore available resources and support systems, Assess for adequacy in community support network, Educate family and significant other(s) on suicide prevention, Complete Psychosocial Assessment, Interpersonal group therapy.  Evaluation of Outcomes: Not Met   Progress in Treatment: Attending groups: Yes. Participating in groups: Yes. Taking medication as prescribed: Yes. Toleration medication: Yes. Family/Significant other contact made: Yes, individual(s) contacted:  father Patient understands diagnosis: No. Discussing patient identified problems/goals with staff: Yes. Medical problems stabilized or resolved: Yes. Denies suicidal/homicidal ideation: Yes. Issues/concerns per patient self-inventory: No. Other: None  New problem(s) identified: No, Describe:  None  New Short Term/Long Term Goal(s):medication stabilization, elimination of SI thoughts, development of comprehensive mental wellness plan.   Patient Goals:  "to work on myself, my sleep and be more open"  Discharge Plan or Barriers: Patient recently admitted. CSW will continue to follow and assess for appropriate referrals and possible discharge planning.   Reason for Continuation of Hospitalization: Anxiety Medication stabilization Suicidal ideation  Estimated Length of Stay: 3-5 days  Attendees: Patient: Joyce [name] 04/04/2021   Physician: Fatima Sanger, MD 04/04/2021   Nursing:  04/04/2021   RN Care Manager: 04/04/2021   Social Worker: Toney Reil, Latanya Presser 04/04/2021   Recreational Therapist:  04/04/2021   Other:  04/04/2021   Other:  04/04/2021   Other: 04/04/2021     Scribe for Treatment Team: Mliss Fritz, [place] 04/04/2021 11:15  AM

## 2021-04-04 NOTE — Progress Notes (Signed)
Pt has been alert and oriented to person, place, time and situation. Pt is calm, cooperative, affect is flat, denies suicidal and homicidal ideation at this time, reports feelings of depression rating them 10/10 on a 0-10 scale, 10 being worst, isolates in her room often times between meals, is medication compliant. Pt is soft spoken, pleasant, makes poor eye contact. Pt is guarded and answers assessment questions with brief answers. Will continue to monitor pt per Q15 minute face checks and monitor for safety and progress.

## 2021-04-05 LAB — CBC WITH DIFFERENTIAL/PLATELET
Abs Immature Granulocytes: 0.02 10*3/uL (ref 0.00–0.07)
Basophils Absolute: 0.1 10*3/uL (ref 0.0–0.1)
Basophils Relative: 1 %
Eosinophils Absolute: 0.1 10*3/uL (ref 0.0–0.5)
Eosinophils Relative: 1 %
HCT: 44.5 % (ref 36.0–46.0)
Hemoglobin: 14 g/dL (ref 12.0–15.0)
Immature Granulocytes: 0 %
Lymphocytes Relative: 44 %
Lymphs Abs: 4.4 10*3/uL — ABNORMAL HIGH (ref 0.7–4.0)
MCH: 28.1 pg (ref 26.0–34.0)
MCHC: 31.5 g/dL (ref 30.0–36.0)
MCV: 89.4 fL (ref 80.0–100.0)
Monocytes Absolute: 0.6 10*3/uL (ref 0.1–1.0)
Monocytes Relative: 6 %
Neutro Abs: 4.9 10*3/uL (ref 1.7–7.7)
Neutrophils Relative %: 48 %
Platelets: 247 10*3/uL (ref 150–400)
RBC: 4.98 MIL/uL (ref 3.87–5.11)
RDW: 15.3 % (ref 11.5–15.5)
WBC: 10.1 10*3/uL (ref 4.0–10.5)
nRBC: 0 % (ref 0.0–0.2)

## 2021-04-05 LAB — TSH: TSH: 0.586 u[IU]/mL (ref 0.350–4.500)

## 2021-04-05 LAB — T4, FREE: Free T4: 1.07 ng/dL (ref 0.61–1.12)

## 2021-04-05 MED ORDER — ARIPIPRAZOLE 5 MG PO TABS
5.0000 mg | ORAL_TABLET | Freq: Every day | ORAL | Status: DC
  Administered 2021-04-05 – 2021-04-06 (×2): 5 mg via ORAL
  Filled 2021-04-05 (×4): qty 1

## 2021-04-05 NOTE — [facility] Group Notes (Signed)
Adult Psychoeducational Group Note  Date:  04/05/2021 Time:  10:13 PM  Group Topic/Focus:  Wrap-Up Group:   The focus of this group is to help patients review their daily goal of treatment and discuss progress on daily workbooks.  Participation Level:  Active  Participation Quality:  Appropriate  Affect:  Appropriate  Cognitive:  Appropriate  Insight: Good  Engagement in Group:  Engaged  Modes of Intervention:  Discussion, Education, and Support  Additional Comments:  Pt shared that she was less depressed today and was able to open up and interact more with others. She rated her day 7/10.  Maura Crandall Cassandra 04/05/2021, 10:13 PM

## 2021-04-05 NOTE — Plan of Care (Signed)
  Problem: Activity: Goal: Interest or engagement in activities will improve Outcome: Progressing   Problem: Education: Goal: Emotional status will improve Outcome: Not Progressing Goal: Mental status will improve Outcome: Not Progressing   

## 2021-04-05 NOTE — Progress Notes (Signed)
  D:  Pt presents with moderate anxiety and depression.  Pt reports staying out of her room most of the day.  Pt attended evening group.  Pt spoke with family today.  Pt reports that her grandmother is not doing well.  Pt expressed concern about a ring and its safety in her assigned locker.  Pt reports the ring was a gift from her grandmother. Pt states, "everyone in the family knows that ring, I can't believe she gave it to me." Pt denies SI/HI and AVH.  Pt verbally contracts for safety.  A:  Labs/Vitals monitored; Medication education provided; Pt encouraged to communicate concerns.  R:  Pt remains safe on unit with Q15 minute checks;  Will continue POC.

## 2021-04-05 NOTE — Progress Notes (Signed)
   04/05/21 1400  Psych Admission Type (Psych Patients Only)  Admission Status Voluntary  Psychosocial Assessment  Patient Complaints Anxiety;Depression  Eye Contact Fair  Facial Expression Flat  Affect Depressed  Speech Unremarkable  Interaction Forwards little;Guarded  Motor Activity Other (Comment) (WDL)  Appearance/Hygiene Unremarkable  Behavior Characteristics Cooperative;Calm  Thought Process  Coherency Circumstantial  Content WDL  Delusions None reported or observed  Perception WDL  Hallucination None reported or observed  Judgment Impaired  Confusion None  Danger to Self  Current suicidal ideation? Denies  Danger to Others  Danger to Others None reported or observed
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
## 2021-04-05 NOTE — Progress Notes (Signed)
     04/04/21 2141  Psych Admission Type (Psych Patients Only)  Admission Status Voluntary  Psychosocial Assessment  Patient Complaints Anxiety;Depression  Eye Contact Fair  Facial Expression Flat  Affect Depressed  Speech Unremarkable  Interaction Forwards little;Guarded  Motor Activity Other (Comment) (WDL)  Appearance/Hygiene Unremarkable  Behavior Characteristics Cooperative;Calm  Mood Sad;Anxious;Pleasant  Thought Process  Coherency Circumstantial  Content WDL  Delusions None reported or observed  Perception WDL  Hallucination None reported or observed  Judgment Impaired  Confusion None  Danger to Self  Current suicidal ideation? Denies  Danger to Others  Danger to Others None reported or observed

## 2021-04-05 NOTE — [facility] Group Notes (Signed)
LCSW Group Therapy Note  10/19/2020   10:00-11:00am   Topic:  Anger Triggers and Coping Skills  Participation Level:  Active  Description of Group:   In this group, patients learned how to recognize the physical, cognitive, emotional, and behavioral responses they have to anger-provoking situations.  They identified their own common triggers and typical reactions then analyzed how these reactions are possibly beneficial and possibly unhelpful.  Focus was placed on how helpful it is to recognize the underlying emotions to anger in order to address these for more permanent resolution.  Emphasis was also on identifying possible replacement thoughts for the automatic thoughts generated in various situations shared by the group.  Therapeutic Goals: Patients will share situations that commonly incite their anger and how they typically respond Patients will identify how their coping skills work for them and/or against them Patients will explore possible alternative thoughts to their automatic ones Patients will learn that anger itself is normal and that healthier reactions can assist with resolving conflict rather than worsening situations  Summary of Patient Progress:  The patient shared that her frequent cause anger is family's negativity and being put down and said her anger is actually happening "a lot."  She will put herself down as well, or will push down her emotions until she lashes out because she cannot handle it any longer.  If she lashes out, it is mainly aimed at her mother.  She stated she tried buying and using a punching bag at home, but that really was not effective for her.  She was appropriate in group, with a depressed mood and blunted affect.  Therapeutic Modalities:   Cognitive Behavioral Therapy Processing  Lynnell Chad

## 2021-04-06 DIAGNOSIS — F333 Major depressive disorder, recurrent, severe with psychotic symptoms: Secondary | ICD-10-CM | POA: Diagnosis present

## 2021-04-06 MED ORDER — ONDANSETRON HCL 4 MG PO TABS
4.0000 mg | ORAL_TABLET | Freq: Once | ORAL | Status: AC
  Administered 2021-04-06: 4 mg via ORAL
  Filled 2021-04-06 (×2): qty 1

## 2021-04-06 NOTE — Plan of Care (Signed)
  Problem: Education: Goal: Emotional status will improve Outcome: Not Progressing Goal: Mental status will improve Outcome: Not Progressing   Problem: Coping: Goal: Will verbalize feelings Outcome: Not Progressing

## 2021-04-06 NOTE — Progress Notes (Signed)
[facility]  04/06/2021 10:06 AM Joyce [name]  MRN:  [id number]  Subjective:  Joyce [name] is a 20 y.o. female with a history of depression and ADHD, who was initially admitted for inpatient psychiatric hospitalization on 04/03/2021 for management of worsening depression with SI. The patient is currently on Hospital Day 3.   Chart Review from last 24 hours:  The patient's chart was reviewed and nursing notes were reviewed. The patient's case was discussed in multidisciplinary team meeting. Per nursing, she has appeared depressed but had no behavioral issues or acute safety concerns on the unit. She attended groups. [facility] with scheduled medications and received Vistaril X1 for anxiety.  Information Obtained Today During Patient Interview: The patient was seen and evaluated on the unit. On assessment today the patient reports that she is feeling sedated this morning and wonders if it was due to the Vistaril she took. I encouraged her to get out of bed and attend groups. She was encouraged to shower and change clothes and states she did shower yesterday. She reports decreased appetite today and was encouraged to drink fluids or Ensure if not eating. She states her sleep is "okay" and she voices no current physical complaints other than some transient nausea that has resolved. She denies paranoia on the unit but appears guarded and suspicious on exam. She admits that her own internal, negative, self-talk is louder, and she endorses belief in thought broadcasting today. She denies ideas of reference or thought insertion/withdrawal. She denies SI or HI. She denies command AH and states the voices she hears are her own, self-deprecating internal dialog.    Principal Problem: MDD (major depressive disorder), recurrent, severe, with psychosis (HCC) Diagnosis: Principal Problem:   MDD (major depressive disorder), recurrent, severe, with psychosis (HCC)  Total Time Spent in  Direct Patient Care:  I personally spent 25 minutes on the unit in direct patient care. The direct patient care time included face-to-face time with the patient, reviewing the patient's chart, communicating with other professionals, and coordinating care. Greater than 50% of this time was spent in counseling or coordinating care with the patient regarding goals of hospitalization, psycho-education, and discharge planning needs.  Past Psychiatric History: see H&P  Past Medical History:  Past Medical History:  Diagnosis Date   Constipation    Hypopituitarism (HCC)    Left foot drop    Thyroid disease     Family History:  Family History  Problem Relation Age of Onset   Hypertension Mother    Healthy Father    Hypertension Paternal Grandmother    Diabetes Paternal Grandmother    Family Psychiatric  History: see H&P  Social History:  Social History   Substance and Sexual Activity  Alcohol Use Yes   Comment: rarely     Social History   Substance and Sexual Activity  Drug Use Never    Social History   Socioeconomic History   Marital status: Single    Spouse name: Not on file   Number of children: 0   Years of education: college student now   [employer] FinancialHighest education level: Not on file  Occupational History   Occupation: student  Tobacco Use   Smoking status: Never   Smokeless tobacco: Never  Vaping Use   Vaping Use: Never used  Substance and Sexual Activity   Alcohol use: Yes    Comment: rarely   Drug use: Never   Sexual activity: Never  Other Topics Concern  Not on file  Social History Narrative   Lives at home with parents.   Right-handed.   No daily caffeine use.   Social Determinants of Health   Financial Resource Strain: Not on file  Food Insecurity: Not on file  Transportation Needs: Not on file  Physical Activity: Not on file  Stress: Not on file  Social Connections: Not on file   Additional Social History:    Pain Medications: see  MAR Prescriptions: see MAR Over the Counter: see MAR History of alcohol / drug use?: No history of alcohol / drug abuse  Sleep: Good  Appetite:  Fair  Current Medications: Current Facility-Administered Medications  Medication Dose Route Frequency Provider Last Rate Last Admin   acetaminophen (TYLENOL) tablet 1,000 mg  1,000 mg Oral Q6H PRN Nira Conn A, NP   1,000 mg at 04/06/21 1000   alum & mag hydroxide-simeth (MAALOX/MYLANTA) 200-200-20 MG/5ML suspension 30 mL  30 mL Oral Q4H PRN Nira Conn A, NP   30 mL at 04/06/21 1000   ARIPiprazole (ABILIFY) tablet 5 mg  5 mg Oral Daily Mason Jim, Ilisha Blust E, MD   5 mg at 04/06/21 0957   FLUoxetine (PROZAC) capsule 20 mg  20 mg Oral Daily Mason Jim, Briyah Wheelwright E, MD   20 mg at 04/06/21 7846   hydrOXYzine (ATARAX/VISTARIL) tablet 25 mg  25 mg Oral TID PRN Jackelyn Poling, NP   25 mg at 04/05/21 2041   levothyroxine (SYNTHROID) tablet 100 mcg  100 mcg Oral q morning Nira Conn A, NP   100 mcg at 04/06/21 0604   magnesium hydroxide (MILK OF MAGNESIA) suspension 30 mL  30 mL Oral Daily PRN Jackelyn Poling, NP       traZODone (DESYREL) tablet 50 mg  50 mg Oral QHS PRN Jackelyn Poling, NP        Lab Results:  Results for orders placed or performed during the hospital encounter of 04/03/21 (from the past 48 hour(s))  CBC with Differential/Platelet     Status: Abnormal   Collection Time: 04/05/21  6:40 AM  Result Value Ref Range   WBC 10.1 4.0 - 10.5 K/uL   RBC 4.98 3.87 - 5.11 MIL/uL   Hemoglobin 14.0 12.0 - 15.0 g/dL   HCT 96.2 95.2 - 84.1 %   MCV 89.4 80.0 - 100.0 fL   MCH 28.1 26.0 - 34.0 pg   MCHC 31.5 30.0 - 36.0 g/dL   RDW 32.4 40.1 - 02.7 %   Platelets 247 150 - 400 K/uL   nRBC 0.0 0.0 - 0.2 %   Neutrophils Relative % 48 %   Neutro Abs 4.9 1.7 - 7.7 K/uL   Lymphocytes Relative 44 %   Lymphs Abs 4.4 (H) 0.7 - 4.0 K/uL   Monocytes Relative 6 %   Monocytes Absolute 0.6 0.1 - 1.0 K/uL   Eosinophils Relative 1 %   Eosinophils Absolute 0.1 0.0 -  0.5 K/uL   Basophils Relative 1 %   Basophils Absolute 0.1 0.0 - 0.1 K/uL   Immature Granulocytes 0 %   Abs Immature Granulocytes 0.02 0.00 - 0.07 K/uL    Comment: Performed at [facility], [address] [place], [place] [address]  TSH     Status: None   Collection Time: 04/05/21  6:40 AM  Result Value Ref Range   TSH 0.586 0.350 - 4.500 uIU/mL    Comment: Performed by a 3rd Generation assay with a functional sensitivity of <=0.01 uIU/mL. Performed at  [facility], [address] [place], [place] [address]   T4, free     Status: None   Collection Time: 04/05/21  6:40 AM  Result Value Ref Range   Free T4 1.07 0.61 - 1.12 ng/dL    Comment: (NOTE) Biotin ingestion may interfere with free T4 tests. If the results are inconsistent with the TSH level, previous test results, or the clinical presentation, then consider biotin interference. If needed, order repeat testing after stopping biotin. Performed at [facility], [address], [place], [place] [address]     Blood Alcohol level:  No results found for: [facility]  Metabolic Disorder Labs: Lab Results  Component Value Date   HGBA1C 5.7 (H) 04/04/2021   MPG 117 04/04/2021   No results found for: PROLACTIN Lab Results  Component Value Date   CHOL 119 04/03/2021   TRIG 42 04/03/2021   HDL 59 04/03/2021   CHOLHDL 2.0 04/03/2021   VLDL 8 04/03/2021   LDLCALC 52 04/03/2021   Musculoskeletal: Strength & Muscle Tone: within normal limits Gait & Station: normal, stable Patient leans: N/A  Psychiatric Specialty Exam: Physical Exam Vitals reviewed.  HENT:     Head: Normocephalic.  Pulmonary:     Effort: Pulmonary effort is normal.  Neurological:     Mental Status: She is alert.    Review of Systems  Respiratory:  Negative for shortness of breath.   Cardiovascular:  Negative for chest pain.  Gastrointestinal:  Positive for nausea. Negative for constipation, diarrhea and  vomiting.   Blood pressure 99/72, pulse 87, temperature 98.1 F (36.7 C), temperature source Oral, resp. rate 18, height 5\' 9"  (1.753 m), weight 82 kg, last menstrual period 03/23/2021, SpO2 100 %.Body mass index is 26.7 kg/m.  General Appearance: Disheveled  Eye Contact:  Minimal  Speech:   mumbling quality, nonspontaneous and only answers direct questions, normal fluency  Volume:  Decreased  Mood:  Depressed and Hopeless  Affect:   blunted, guarded  Thought Process:  Linear and superficially goal directed  Orientation:  Full (Time, Place, and Person)  Thought Content:   appears paranoid and guarded on exam and admits to belief in thought broadcasting; reports loud internal negative self-talk but denies other AH or command voices; denies VH, ideas of reference  Suicidal Thoughts:  No  Homicidal Thoughts:  No  Memory:  Recent;   Fair  Judgement:  Fair  Insight:  Fair  Psychomotor Activity:  Decreased  Concentration:  Concentration: Fair and Attention Span: Fair  Recall:  05/24/2021 of Knowledge:  Fair  Language:  Fair  Akathisia:  Negative  Assets:  Desire for Improvement Housing Resilience Social Support  ADL's:  independent  Cognition:  WNL  Sleep:  Number of Hours: 6.75   Treatment Plan Summary: Diagnoses / Active Problems: MDD recurrent severe with psychotic features  PLAN: Safety and Monitoring:  -- Voluntary admission to inpatient psychiatric unit for safety, stabilization and treatment  -- Daily contact with patient to assess and evaluate symptoms and progress in treatment  -- Patient's case to be discussed in multi-disciplinary team meeting  -- Observation Level : q15 minute checks  -- Vital signs:  q12 hours  -- Precautions: suicide, elopement, and assault  2. Psychiatric Diagnoses and Treatment:   MDD recurrent severe with psychotic features  -- Continue Prozac 20mg  daily for depression (completed taper off Lexapro on 04/05/21)  -- Continue Abilify 5mg  daily  for paranoia and augmentation for depression - anticipate  dose titration as tolerated  -- Continue PRN Vistaril 25mg  tid for anxiety   -- Continue PRN Trazodone 50mg  qhs for sleep  -- Would benefit from psychotherapy after discharge  -- Encouraged patient to participate in unit milieu and in scheduled group therapies   -- Short Term Goals: Ability to demonstrate self-control will improve and Ability to identify and develop effective coping behaviors will improve  -- Long Term Goals: Improvement in symptoms so as ready for discharge   3. Medical Issues Being Addressed:   Hypothyroidism  -- Continue Synthroid daily  -- Admission TSH 0.275 and on repeat 0.586, FT4 1.07, FT3 pending  4. Discharge Planning:   -- Social work and case management to assist with discharge planning and identification of hospital follow-up needs prior to discharge  -- Estimated LOS: 3-4 days  -- Discharge Concerns: Need to establish a safety plan; Medication compliance and effectiveness  -- Discharge Goals: Return home with outpatient referrals for mental health follow-up including medication management/psychotherapy  , MD, FAPA 04/06/2021, 10:06 AM

## 2021-04-06 NOTE — Progress Notes (Signed)
Pt is alert and oriented to person, place, time and situation. Pt is calm, cooperative, isolative in her room, reports feelings of depression as a 5/10 on a 0-10 scale, 10 being worst. Pt denies feelings of anxiety. Pt denies hallucinations. Pt reports she slept well last night. Pt's affect is flat eye contact poor, soft spoken. Pt denies suicidal and homicidal ideation. Pt c/o poor appetite this morning and reports she did not eat much breakfast. Pt reports she has a headache and requests PRN pain medications for that, rating her headache 5/10 on a 0-10 scale, 10 being worst. Pt c/o abdominal discomfort, describes it like acid reflux, and given PRN mediation for that. Pt when encouraged to stay out of bed and try to engage in groups, will agree to sit in the hallways or dayroom, but has her head hung very low, face in her lap, arms crossed. Very slow psychomotor movements noted. Will continue to monitor pt per Q15 minute face checks and monitor for safety and progress.

## 2021-04-06 NOTE — Progress Notes (Signed)
Per care/orders/instructions to "call lab and see if they are running urine culture off of urine already in lab, if not please collect urine and send."   Called lab, they need a urine sample within 24 hours, the urine in lab is too old >24 hours for this order.   Provided pt with a urine sample cup and requested that pt provide sample as soon as she is able; pt verbalized understanding.

## 2021-04-06 NOTE — [facility] Group Notes (Signed)
[facility] LCSW Group Therapy Note  Date/Time:  04/06/2021 9:00-10:00 or 10:00-11:00AM  Type of Therapy and Topic:  Group Therapy:  Healthy and Unhealthy Supports  Participation Level:  Active   Description of Group:  Patients in this group were introduced to the idea of adding a variety of healthy supports to address the various needs in their lives.Patients discussed what additional healthy supports could be helpful in their recovery and wellness after discharge in order to prevent future hospitalizations.   An emphasis was placed on using counselor, doctor, therapy groups, 12-step groups, and problem-specific support groups to expand supports.  Several songs were played to emphasize points made throughout group.  Therapeutic Goals:   1)  discuss importance of adding supports to stay well once out of the hospital  2)  compare healthy versus unhealthy supports and identify some examples of each  3)  generate ideas and descriptions of healthy supports that can be added  4)  offer mutual support about how to address unhealthy supports  5)  encourage active participation in and adherence to discharge plan    Summary of Patient Progress:  The patient stated that current healthy supports in her life are her dog and sometimes her father while current unhealthy supports include her mother, who tried to talk her out of coming to the hospital for help, saying that she would just be given medicine and does not need medicine.  The patient expressed a willingness to add support(s) to help in her recovery journey.  She sat quietly but attentively for the remainder of group and spoke to CSW at the close of group 1:1.   Therapeutic Modalities:   Motivational Interviewing Brief Solution-Focused Therapy  Ambrose Mantle, LCSW

## 2021-04-06 NOTE — Progress Notes (Signed)
     04/05/21 2041  Psych Admission Type (Psych Patients Only)  Admission Status Voluntary  Psychosocial Assessment  Patient Complaints Anxiety;Depression  Eye Contact Fair  Facial Expression Flat  Affect Depressed  Speech Unremarkable  Interaction Forwards little;Guarded  Motor Activity Other (Comment)  Appearance/Hygiene Unremarkable  Behavior Characteristics Cooperative;Calm  Thought Process  Coherency Circumstantial  Content WDL  Delusions None reported or observed  Perception WDL  Hallucination None reported or observed  Judgment Impaired  Confusion None  Danger to Self  Current suicidal ideation? Denies  Danger to Others  Danger to Others None reported or observed

## 2021-04-07 LAB — T3, FREE: T3, Free: 2.5 pg/mL (ref 2.0–4.4)

## 2021-04-07 MED ORDER — ENSURE ENLIVE PO LIQD
237.0000 mL | Freq: Two times a day (BID) | ORAL | Status: DC
  Filled 2021-04-07 (×4): qty 237

## 2021-04-07 MED ORDER — ARIPIPRAZOLE 10 MG PO TABS
10.0000 mg | ORAL_TABLET | Freq: Every day | ORAL | Status: DC
  Filled 2021-04-07 (×2): qty 1

## 2021-04-07 MED ORDER — ARIPIPRAZOLE 5 MG PO TABS
5.0000 mg | ORAL_TABLET | Freq: Every day | ORAL | Status: DC
  Administered 2021-04-08: 5 mg via ORAL
  Filled 2021-04-07 (×2): qty 1

## 2021-04-07 NOTE — [facility] Group Notes (Signed)
Type of Therapy and Topic: Group Therapy: Anger Management   Participation Level: Active   Description of Group: In this group, patients will learn helpful strategies and techniques to manage anger, express anger in alternative ways, change hostile attitudes, and prevent aggressive acts, such as verbal abuse and violence.This group will be process-oriented and eductional, with patients participating in exploration of their own experiences as well as giving and receiving support and challenge from other group members.  Therapeutic Goals: Patient will learn to manage anger. Patient will learn to stop violence or the threat of violence. Patient will learn to develop self control over thoughts and actions. Patient will receive support and feedback from others  Summary of Patient Progress: Joyce [name] shared that she uses music and hitting a tree with a bat to avoid becoming angry.  She accepted the worksheets and participated in the group discussion with her peers.    Therapeutic Modalities: Cognitive Behavioral Therapy Solution Focused Therapy Motivational Interviewing

## 2021-04-07 NOTE — Progress Notes (Addendum)
[facility] MD Progress Note   I have independently evaluated the patient during a face-to-face assessment on 04/07/21. I reviewed the patient's chart, and I participated in key portions of the service. I discussed the case with the [employer], and I agree with the assessment and plan of care as documented in the House Officer's note.   On my assessment, patient denies AVH and states her thought broadcasting is resolved. She continues to have internal, negative self-talk but denies paranoia on the unit. She is not grossly responding to internal stimuli on exam but does still appear guarded on assessment with slow speech and poor eye contact. She was encouraged to attend groups and attend to ADLs. She is resistant to dose increase in Abilify but agrees to continue 5mg  daily with her Prozac 20mg  at this time. Will continue to monitor.   , MD, FAPA  04/07/2021 4:16 PM Joyce [name]  MRN:  04/09/2021 Subjective:  Joyce [name] is a 20 y.o. female with a history of depression and ADHD, who was initially admitted for inpatient psychiatric hospitalization on 04/03/2021 for management of worsening depression with SI. [facility] stay day 4.   Last night:  Patient has been compliant with her medication. PRNs received word Tylenol for headaches and Maalox for abdominal discomfort.  Today (04/07/2021): Patient was sitting in the common area, with her head down writing in her notebook.  Patient's speech was slowed.  Patient stated that her mood is improving slowly.  However patient ruminated that she feels like she will always be depressed, will have to learn how to live with it. Patient slept Number of Hours: 6.75, which patient stated it was " fine". Patient joked that she wished the mattress here were as comfortable as the one at home. Patient stated appetite was " okay", but is improving. Patient complained of drowsiness and headache that she attributed to the Abilify.  Requested if she could stop the  Abilify.  However discussed with patient due to medical changes, it is difficult to ascertain whether the side effects are from the Abilify or starting a new antidepressant.  Also stated that the side effects are common that do typically self resolve.  Also informed patient that PRNs are available to the patient for abdominal discomfort and headaches.  Patient was amenable to continuing the trial of Abilify. Patient also requested that upon discharge that she would be supplied with resources for depression support groups.   Principal Problem: MDD (major depressive disorder), recurrent, severe, with psychosis (HCC) Diagnosis: Principal Problem:   MDD (major depressive disorder), recurrent, severe, with psychosis (HCC)  Total Time spent with patient: 30 minutes  Past Psychiatric History: See H&P  Past Medical History:  Past Medical History:  Diagnosis Date   Constipation    Hypopituitarism (HCC)    Left foot drop    Thyroid disease    History reviewed. No pertinent surgical history. Family History:  Family History  Problem Relation Age of Onset   Hypertension Mother    Healthy Father    Hypertension Paternal Grandmother    Diabetes Paternal Grandmother    Family Psychiatric  History: See H&P Social History:  Social History   Substance and Sexual Activity  Alcohol Use Yes   Comment: rarely     Social History   Substance and Sexual Activity  Drug Use Never    Social History   Socioeconomic History   Marital status: Single    Spouse name: Not on file   Number  of children: 0   Years of education: college student now   [facility] education level: Not on file  Occupational History   Occupation: student  Tobacco Use   Smoking status: Never   Smokeless tobacco: Never  Vaping Use   Vaping Use: Never used  Substance and Sexual Activity   Alcohol use: Yes    Comment: rarely   Drug use: Never   Sexual activity: Never  Other Topics Concern   Not on file  Social History  Narrative   Lives at home with parents.   Right-handed.   No daily caffeine use.   Social Determinants of Health   Financial Resource Strain: Not on file  Food Insecurity: Not on file  Transportation Needs: Not on file  Physical Activity: Not on file  Stress: Not on file  Social Connections: Not on file   Additional Social History:    Pain Medications: see MAR Prescriptions: see MAR Over the Counter: see MAR History of alcohol / drug use?: No history of alcohol / drug abuse      Sleep: Fair  Appetite:  Fair  Current Medications: Current Facility-Administered Medications  Medication Dose Route Frequency Provider Last Rate Last Admin   acetaminophen (TYLENOL) tablet 1,000 mg  1,000 mg Oral Q6H PRN Nira Conn A, NP   1,000 mg at 04/06/21 1000   alum & mag hydroxide-simeth (MAALOX/MYLANTA) 200-200-20 MG/5ML suspension 30 mL  30 mL Oral Q4H PRN Nira Conn A, NP   30 mL at 04/06/21 1000   [START ON 04/08/2021] ARIPiprazole (ABILIFY) tablet 5 mg  5 mg Oral Daily Princess Bruins, DO       feeding supplement (ENSURE ENLIVE / ENSURE PLUS) liquid 237 mL  237 mL Oral BID BM Princess Bruins, DO       FLUoxetine (PROZAC) capsule 20 mg  20 mg Oral Daily Mason Jim, [place] Pelto E, MD   20 mg at 04/07/21 1324   hydrOXYzine (ATARAX/VISTARIL) tablet 25 mg  25 mg Oral TID PRN Jackelyn Poling, NP   25 mg at 04/05/21 2041   levothyroxine (SYNTHROID) tablet 100 mcg  100 mcg Oral q morning Nira Conn A, NP   100 mcg at 04/07/21 4010   magnesium hydroxide (MILK OF MAGNESIA) suspension 30 mL  30 mL Oral Daily PRN Jackelyn Poling, NP       traZODone (DESYREL) tablet 50 mg  50 mg Oral QHS PRN Jackelyn Poling, NP        Lab Results: No results found for this or any previous visit (from the past 48 hour(s)).  Blood Alcohol level:  No results found for: [facility]  Metabolic Disorder Labs: Lab Results  Component Value Date   HGBA1C 5.7 (H) 04/04/2021   MPG 117 04/04/2021   No results found for: PROLACTIN Lab  Results  Component Value Date   CHOL 119 04/03/2021   TRIG 42 04/03/2021   HDL 59 04/03/2021   CHOLHDL 2.0 04/03/2021   VLDL 8 04/03/2021   LDLCALC 52 04/03/2021    Physical Findings: AIMS: Facial and Oral Movements Muscles of Facial Expression: None, normal Lips and Perioral Area: None, normal Jaw: None, normal Tongue: None, normal,Extremity Movements Upper (arms, wrists, hands, fingers): None, normal Lower (legs, knees, ankles, toes): None, normal, Trunk Movements Neck, shoulders, hips: None, normal, Overall Severity Severity of abnormal movements (highest score from questions above): None, normal Incapacitation due to abnormal movements: None, normal Patient's awareness of abnormal movements (rate only patient's report): No Awareness, Dental Status Current  problems with teeth and/or dentures?: No Does patient usually wear dentures?: No  CIWA:  CIWA-Ar Total: 0   Musculoskeletal: Strength & Muscle Tone: within normal limits Gait & Station: normal Patient leans: N/A  Psychiatric Specialty Exam:  Presentation  General Appearance: Casual; Fairly Groomed  Eye Contact:Fleeting (Patient mainly looks at the floor when speaking.)  Speech:Clear and Coherent; Slow  Speech Volume:Normal  Handedness:Right   Mood and Affect  Mood:Depressed; Hopeless  Affect:Depressed   Thought Process  Thought Processes:Coherent; Goal Directed; Linear  Descriptions of Associations:Intact  Orientation:Full (Time, Place and Person)  Thought Content:Rumination (Patient ruminates on how she will always be depressed and how she is unable to have positive thoughts about herself.) Denies AVH, paranoia, ideas of reference, or first rank symptoms today - still appears paranoid on exam  History of Schizophrenia/Schizoaffective disorder:No data recorded Duration of Psychotic Symptoms:No data recorded Hallucinations:Hallucinations: None  Ideas of Reference:None  Suicidal Thoughts:Suicidal  Thoughts: No  Homicidal Thoughts:Homicidal Thoughts: No   Sensorium  Memory:Immediate Good; Recent Good; Remote Fair  Judgment:Fair  Insight:Fair   Executive Functions  Concentration:Fair  Attention Span:Fair  Recall:Fair  Fund of Knowledge:Fair  Language:Fair   Psychomotor Activity  Psychomotor Activity:Psychomotor Activity: Normal   Assets  Assets:Desire for Improvement; Financial Resources/Insurance; Social Support   Sleep  Sleep:Sleep: Fair (Patient stated that her sleep is not better because the mattress is not comfortable here, compared to home.) Number of Hours of Sleep: 6.75    Physical Exam: Physical Exam HENT:     Head: Normocephalic.  Pulmonary:     Effort: Pulmonary effort is normal.  Skin:    General: Skin is warm and dry.  Neurological:     Mental Status: She is alert and oriented to person, place, and time.     Comments: Patient was able to report that [facility] [place]; July 2022; President Biden   Review of Systems  Eyes:  Negative for blurred vision.  Respiratory:  Negative for shortness of breath.   Cardiovascular:  Negative for chest pain.  Gastrointestinal:  Positive for abdominal pain (Patient reported intermitent and mild abdominal discomfort.).  Neurological:  Positive for headaches (Patient reported mild headaches that resolved with Tylenol). Negative for dizziness.  Blood pressure 92/61, pulse (!) 101, temperature 98.2 F (36.8 C), temperature source Oral, resp. rate 18, height 5\' 9"  (1.753 m), weight 82 kg, last menstrual period 03/23/2021, SpO2 100 %. Body mass index is 26.7 kg/m.   Treatment Plan Summary: Daily contact with patient to assess and evaluate symptoms and progress in treatment and Medication management  Treatment Plan Summary: Diagnoses / Active Problems: MDD recurrent severe with psychotic features   PLAN: Safety and Monitoring:             -- Voluntary admission to inpatient  psychiatric unit for safety, stabilization and treatment             -- Daily contact with patient to assess and evaluate symptoms and progress in treatment             -- Patient's case to be discussed in multi-disciplinary team meeting             -- Observation Level : q15 minute checks             -- Vital signs:  q12 hours             -- Precautions: suicide, elopement, and assault   2. Psychiatric Diagnoses and Treatment:  MDD recurrent severe with psychotic features             -- Continue Prozac 20mg  daily for depression (completed taper off Lexapro on 04/05/21)             -- Continue Abilify 5mg  daily for paranoia and augmentation for depression - anticipate dose titration as tolerated but she resists dose increase today  -- If paranoia and psychotic symptoms of depression do not continue to improve with medication management may need additional medical w/u of psychosis             -- Continue PRN Vistaril 25mg  tid for anxiety             -- Continue PRN Trazodone 50mg  qhs for sleep             -- Would benefit from psychotherapy after discharge             -- Encouraged patient to participate in unit milieu and in scheduled group therapies             -- Short Term Goals: Ability to demonstrate self-control will improve and Ability to identify and develop effective coping behaviors will improve             -- Long Term Goals: Improvement in symptoms so as ready for discharge              3. Medical Issues Being Addressed:              Hypothyroidism             -- Continue Synthroid 04/07/21 daily             -- Admission TSH 0.275 and on repeat 0.586, FT4 1.07, FT3 2.5   Urine Culture pending   4. Discharge Planning:             -- Social work and case management to assist with discharge planning and identification of hospital follow-up needs prior to discharge             -- Estimated LOS: 3-4 days             -- Discharge Concerns: Need to establish a safety plan;  Medication compliance and effectiveness             -- Discharge Goals: Return home with outpatient referrals for mental health follow-up including medication management/psychotherapy   , DO, PGY-1 04/07/2021, 4:16 PM

## 2021-04-07 NOTE — Plan of Care (Signed)
  Problem: Education: Goal: Emotional status will improve Outcome: Not Progressing Goal: Mental status will improve Outcome: Not Progressing   Problem: Activity: Goal: Interest or engagement in activities will improve Outcome: Not Progressing   

## 2021-04-07 NOTE — Progress Notes (Signed)
Recreation Therapy Notes  Date:  7.25.22 Time: 0930 Location: [address] Dayroom  Group Topic: Stress Management  Goal Area(s) Addresses:  Patient will identify positive stress management techniques. Patient will identify benefits of using stress management post d/c.  Behavioral Response: Appropriate  Intervention: Stress Management  Activity : Meditation.  LRT played a meditation that focused on being indecisive.  Meditation talked about being okay with the decisions you make even if they turn out to be the wrong decision, there is still room to learn from them.   Education:  Stress Management, Discharge Planning.   Education Outcome: Acknowledges Education  Clinical Observations/Feedback: Pt attended and participated.  Pt had no questions or concerns.    Caroll Rancher, LRT/CTRS         Caroll Rancher A 04/07/2021 11:52 AM

## 2021-04-07 NOTE — Progress Notes (Signed)
      04/06/21 2128  Psych Admission Type (Psych Patients Only)  Admission Status Voluntary  Psychosocial Assessment  Patient Complaints Depression  Eye Contact Fair  Facial Expression Flat  Affect Depressed  Speech Unremarkable  Interaction Forwards little;Guarded  Motor Activity Other (Comment)  Appearance/Hygiene Unremarkable  Behavior Characteristics Cooperative;Calm  Mood Depressed  Thought Process  Coherency Circumstantial  Content WDL  Delusions None reported or observed  Perception WDL  Hallucination None reported or observed  Judgment Impaired  Confusion None  Danger to Self  Current suicidal ideation? Denies  Danger to Others  Danger to Others None reported or observed

## 2021-04-08 DIAGNOSIS — F333 Major depressive disorder, recurrent, severe with psychotic symptoms: Principal | ICD-10-CM

## 2021-04-08 LAB — URINE CULTURE: Special Requests: NORMAL

## 2021-04-08 MED ORDER — FLUOXETINE HCL 20 MG PO CAPS: 20.0000 mg | ORAL_CAPSULE | Freq: Every day | ORAL | 0 refills | Status: DC

## 2021-04-08 MED ORDER — ARIPIPRAZOLE 5 MG PO TABS: 5.0000 mg | ORAL_TABLET | Freq: Every day | ORAL | 0 refills | Status: DC

## 2021-04-08 NOTE — Plan of Care (Signed)
  Problem: Education: Goal: Emotional status will improve Outcome: Progressing Goal: Mental status will improve Outcome: Progressing   

## 2021-04-08 NOTE — Progress Notes (Signed)
  [facility] Adult Case Management Discharge Plan :  Will you be returning to the same living situation after discharge:  Yes,  Home At discharge, do you have transportation home?: Yes,  Family Do you have the ability to pay for your medications: Yes,  Family   Release of information consent forms completed and in the chart;  Patient's signature needed at discharge.  Patient to Follow up at:  Follow-up Information     Merri Brunette, MD. Call.   Specialty: Family Medicine Why: Please call to schedule a hospital follow up appointment with your primary care provider. Contact information: [address] [place] [address] [phone]         Arsenio Loader, [facility]. Go on 04/10/2021.   Why: You have a hospital follow up appointment for therapy and medication management services on 04/10/21 at 8:30 am.  This appointment will be held in person. Contact information: [address] [place] [address] [phone]         [facility]. Schedule an appointment as soon as possible for a visit.   Why: Please call to schedule an appointment with Oletta Lamas for therapy services after 04/14/21. Contact information: [address], [place] [address]  P: [phone], [phone]                Next level of care provider has access to [facility] Link:no  Safety Planning and Suicide Prevention discussed: Yes,  with father      Has patient been referred to the Quitline?: N/A patient is not a smoker  Patient has been referred for addiction treatment: N/A  Aram Beecham, LCSWA 04/08/2021, 9:34 AM

## 2021-04-08 NOTE — Progress Notes (Signed)
Adult Psychoeducational Group Note  Date:  04/08/2021 Time:  9:39 AM  Group Topic/Focus:  Goals Group:   The focus of this group is to help patients establish daily goals to achieve during treatment and discuss how the patient can incorporate goal setting into their daily lives to aide in recovery.  Participation Level:  Active  Participation Quality:  Appropriate  Affect:  Appropriate  Cognitive:  Appropriate  Insight: Appropriate  Engagement in Group:  Engaged  Modes of Intervention:  Discussion  Additional Comments: The patient  expressed that her goal is to prepare for discharge.                                             Octavio Manns 04/08/2021, 9:39 AM

## 2021-04-08 NOTE — Discharge Summary (Signed)
Physician Discharge Summary Note  Patient:  Joyce HeinzCourtney R Ring is an 20 y.o., female MRN:  [id number] DOB:  Mar 26, 2001 Patient phone:  [phone] (home)  Patient address:   [address] [place] [id number],  Total Time spent with patient: 30 minutes  Date of Admission:  04/03/2021 Date of Discharge: 04/08/2021  Reason for Admission:  (From MD's admission note): Joyce [name] is a 20 y.o. female with a PMH of depression, ADHD, and hypothyroidism who presents with depressed mood and passive SI. She states that she "snapped" on 04/01/21 and punched a glass door but did not break it. Her reaction was in response to a heated discussion with her mother where she was told she "needs help." She recounts that since June, she has been stressed due to her mother taking her work stress out on the family. 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 Her father is stressed in response to the patient's brother needing extensive psychiatric aid, and the patient reports her father and mother have discussed divorce. Both of her grandmothers are declining in health, as well. These familial stressors, which have persisted since high school, have contributed to her current episode of depression.   On admission, she endorsed passive SI of unknown quality with no intent or plan but is unsure of SI at the time of the interview. She denies HI or visual hallucinations. When asked about auditory hallucinations, she stated that she just hears herself saying negative things to herself, such as "You stupid piece of shit," "You're pathetic," and "Why can't you get your shit together?" She denies any voices that are not hers or that tell her to do things. She does endorse some apprehension around people stating that people do not always seem genuine with her, but she does not believe anyone is watching her, following her, recording her, or conspiring against her.  She denies first rank symptoms or ideas of reference. She also denies any episodes of grandiosity, extreme energy, flight of ideas, or distractibility or previous diagnoses of mania or hypomania.   Lately, she has felt more irritable and has shouted at her mother, but she has not been physically violent. She has felt "hopeless," has little energy and concentration, and feels guilty about "not being a better daughter." She states that her appetite has been lower than usual. She has also been sleeping more, and this has been another area that her mother gets onto her about. She used to enjoy writing and creating art, but lately she has not had any interest in doing those activities. She also endorses headaches at times. She denies any alcohol or substance use.    Past Psychiatric History:   -Depression, for which she takes Lexapro, managed by her PCP Merri Brunetteandace Smith, MD. The dose has been titrated to 20 mg (initially at 5mg  then 10mg  and up to 20mg  since July) with no perceived benefit from med. -ADHD; she takes no medication for this.   She reports trying psychotherapy, breathing exercises, and meditation to manage her depression in the past but denies previous medication trials. She denies previous psychiatric hospitalizations or previous suicide attempts. She denies previous PHP/IOP or ECT and denies h/o self-mutilating behavior.    Principal Problem: MDD (major depressive disorder), recurrent, severe, with psychosis (HCC) Discharge Diagnoses: Principal Problem:   MDD (major depressive disorder), recurrent, severe, with psychosis (HCC)   Past Psychiatric History: See H&P  Past Medical  History:  Past Medical History:  Diagnosis Date   Constipation    Hypopituitarism (HCC)    Left foot drop    Thyroid disease    History reviewed. No pertinent surgical history. Family History:  Family History  Problem Relation Age of Onset   Hypertension Mother    Healthy Father    Hypertension Paternal  Grandmother    Diabetes Paternal Grandmother    Family Psychiatric  History: See H&P Social History:  Social History   Substance and Sexual Activity  Alcohol Use Yes   Comment: rarely     Social History   Substance and Sexual Activity  Drug Use Never    Social History   Socioeconomic History   Marital status: Single    Spouse name: Not on file   Number of children: 0   Years of education: college student now   [employer] education level: Not on file  Occupational History   Occupation: student  Tobacco Use   Smoking status: Never   Smokeless tobacco: Never  Vaping Use   Vaping Use: Never used  Substance and Sexual Activity   Alcohol use: Yes    Comment: rarely   Drug use: Never   Sexual activity: Never  Other Topics Concern   Not on file  Social History Narrative   Lives at home with parents.   Right-handed.   No daily caffeine use.   Social Determinants of Health   Financial Resource Strain: Not on file  Food Insecurity: Not on file  Transportation Needs: Not on file  Physical Activity: Not on file  Stress: Not on file  Social Connections: Not on file    Hospital Course:  After the above admission evaluation, Larrisa's presenting symptoms were noted. She was recommended for mood stabilization treatments. The medication regimen targeting those presenting symptoms were discussed with her & initiated with her consent. She was started on Abilify. She also had Trazodone PRN for sleep and Vistaril PRN for anxiety available. Her home medication Synthroid was restarted. Her UDS on arrival to the ED was not collected. She was however medicated, stabilized & discharged on the medications as listed on her discharge medication list below. Besides the mood stabilization treatments, Caelyn was also enrolled & participated in the group counseling sessions being offered & held on this unit. She learned coping skills. She presented no other significant pre-existing medical issues  that required treatment. She tolerated her treatment regimen without any adverse effects or reactions reported.   During the course of her hospitalization, the 15-minute checks were adequate to ensure patient's safety. Totiana did not display any dangerous, violent or suicidal behavior on the unit.  She interacted with patients & staff appropriately, participated appropriately in the group sessions/therapies. Her medications were addressed & adjusted to meet her needs. She was recommended for outpatient follow-up care & medication management upon discharge to assure continuity of care & mood stability.  At the time of discharge patient is not reporting any acute suicidal/homicidal ideations. She feels more confident about her self-care & in managing his mental health. She currently denies any new issues or concerns. Education and supportive counseling provided throughout her hospital stay & upon discharge.   Today upon her discharge evaluation with the attending psychiatrist, Karisa shares she is doing well. She denies any other specific concerns. She is sleeping well. Her appetite is good. She denies other physical complaints. She denies AH/VH, delusional thoughts or paranoia. She does not appear to be responding to any internal stimuli.  She feels that her medications have been helpful & is in agreement to continue her current treatment regimen as recommended. She was able to engage in safety planning including plan to return to [facility] or contact emergency services if she feels unable to maintain her own safety or the safety of others. Pt had no further questions, comments, or concerns. She left [facility] with all personal belongings in no apparent distress. Transportation home via private vehicle with her family.    Physical Findings: AIMS: Facial and Oral Movements Muscles of Facial Expression: None, normal Lips and Perioral Area: None, normal Jaw: None, normal Tongue: None, normal,Extremity Movements Upper  (arms, wrists, hands, fingers): None, normal Lower (legs, knees, ankles, toes): None, normal, Trunk Movements Neck, shoulders, hips: None, normal, Overall Severity Severity of abnormal movements (highest score from questions above): None, normal Incapacitation due to abnormal movements: None, normal Patient's awareness of abnormal movements (rate only patient's report): No Awareness, Dental Status Current problems with teeth and/or dentures?: No Does patient usually wear dentures?: No  CIWA:  CIWA-Ar Total: 0 COWS:     Musculoskeletal: Strength & Muscle Tone: within normal limits Gait & Station: normal Patient leans: N/A  Psychiatric Specialty Exam:  Presentation  General Appearance: Casual; Fairly Groomed; Appropriate for Environment  Eye Contact:Fleeting (Patient mainly looks at the floor when speaking.)  Speech:Clear and Coherent; Slow  Speech Volume:Normal  Handedness:Right  Mood and Affect  Mood:Euthymic  Affect:Congruent  Thought Process  Thought Processes:Coherent; Goal Directed; Linear  Descriptions of Associations:Intact  Orientation:Full (Time, Place and Person)  Thought Content:Logical (Patient ruminates on how she will always be depressed and how she is unable to have positive thoughts about herself.)  History of Schizophrenia/Schizoaffective disorder:No data recorded Duration of Psychotic Symptoms:No data recorded Hallucinations:Hallucinations: None  Ideas of Reference:None  Suicidal Thoughts:Suicidal Thoughts: No  Homicidal Thoughts:Homicidal Thoughts: No  Sensorium  Memory:Immediate Good; Recent Good; Remote Fair  Judgment:Fair  Insight:Fair  Executive Functions  Concentration:Fair  Attention Span:Fair  Recall:Fair  Fund of Knowledge:Fair  Language:Fair  Psychomotor Activity  Psychomotor Activity:Psychomotor Activity: Normal  Assets  Assets:Desire for Improvement; Financial Resources/Insurance; Research scientist (medical); Investment banker, corporate; Housing; Transportation; Vocational/Educational; Resilience; Physical Health  Sleep  Sleep:Sleep: Good (Patient stated that her sleep is not better because the mattress is not comfortable here, compared to home.) Number of Hours of Sleep: 6.75  Physical Exam: Physical Exam Vitals and nursing note reviewed.  Constitutional:      Appearance: Normal appearance.  HENT:     Head: Normocephalic.  Pulmonary:     Effort: Pulmonary effort is normal.  Musculoskeletal:        General: Normal range of motion.     Cervical back: Normal range of motion.  Neurological:     General: No focal deficit present.     Mental Status: She is alert and oriented to person, place, and time.  Psychiatric:        Attention and Perception: Attention and perception normal.        Mood and Affect: Mood normal.        Speech: Speech normal.        Behavior: Behavior normal. Behavior is cooperative.        Thought Content: Thought content normal. Thought content is not paranoid or delusional. Thought content does not include homicidal or suicidal ideation. Thought content does not include homicidal or suicidal plan.        Cognition and Memory: Cognition normal.   Review of Systems  Constitutional:  Negative for fever.  HENT: Negative.  Negative for congestion and sore throat.   Respiratory: Negative.    Cardiovascular: Negative.  Negative for chest pain.  Gastrointestinal: Negative.   Genitourinary: Negative.   Musculoskeletal: Negative.   Neurological: Negative.    Blood pressure 106/72, pulse 83, temperature 98 F (36.7 C), temperature source Oral, resp. rate 18, height 5\' 9"  (1.753 m), weight 82 kg, last menstrual period 03/23/2021, SpO2 100 %. Body mass index is 26.7 kg/m.   Social History   Tobacco Use  Smoking Status Never  Smokeless Tobacco Never   Tobacco Cessation:  N/A, patient does not currently use tobacco products   Blood Alcohol level:  No results found for:  [facility]  Metabolic Disorder Labs:  Lab Results  Component Value Date   HGBA1C 5.7 (H) 04/04/2021   MPG 117 04/04/2021   No results found for: PROLACTIN Lab Results  Component Value Date   CHOL 119 04/03/2021   TRIG 42 04/03/2021   HDL 59 04/03/2021   CHOLHDL 2.0 04/03/2021   VLDL 8 04/03/2021   LDLCALC 52 04/03/2021    See Psychiatric Specialty Exam and Suicide Risk Assessment completed by Attending Physician prior to discharge.  Discharge destination:  Home  Is patient on multiple antipsychotic therapies at discharge:  No   Has Patient had three or more failed trials of antipsychotic monotherapy by history:  No  Recommended Plan for Multiple Antipsychotic Therapies: NA  Discharge Instructions     Diet - low sodium heart healthy   Complete by: As directed    Increase activity slowly   Complete by: As directed       Allergies as of 04/08/2021   No Known Allergies      Medication List     STOP taking these medications    escitalopram 20 MG tablet Commonly known as: LEXAPRO       TAKE these medications      Indication  ARIPiprazole 5 MG tablet Commonly known as: ABILIFY Take 1 tablet (5 mg total) by mouth daily. Start taking on: April 09, 2021  Indication: Major Depressive Disorder   FLUoxetine 20 MG capsule Commonly known as: PROZAC Take 1 capsule (20 mg total) by mouth daily. Start taking on: April 09, 2021  Indication: Major Depressive Disorder   levothyroxine 100 MCG tablet Commonly known as: SYNTHROID Take 100 mcg by mouth every morning.  Indication: Underactive Thyroid        Follow-up Information     April 11, 2021, MD. Call.   Specialty: Family Medicine Why: Please call to schedule a hospital follow up appointment with your primary care provider. Contact information: [address] [place] [phone]         [phone], [facility]. Go on 04/10/2021.   Why: You have a hospital follow up  appointment for therapy and medication management services on 04/10/21 at 8:30 am.  This appointment will be held in person. Contact information: [address] [place] [phone]         [facility]. Schedule an appointment as soon as possible for a visit.   Why: Please call to schedule an appointment with [phone] for therapy services after 04/14/21. Contact information: [address], [address] [place]  P: [phone], [phone]                Follow-up recommendations:  Activity:  as tolerated Diet:  heart healthy  Comments:  Prescriptions were given at discharge.  Patient is agreeable with the discharge plan.  She was given an opportunity to ask questions.  She appears to feel comfortable with discharge and denies any current suicidal or homicidal thoughts.   Patient is instructed prior to discharge to: Take all medications as prescribed by her mental healthcare provider. Report any adverse effects and or reactions from the medicines to her outpatient provider promptly. Patient has been instructed & cautioned: To not engage in alcohol and or illegal drug use while on prescription medicines. In the event of worsening symptoms, patient is instructed to call the crisis hotline, 911 and or go to the nearest ED for appropriate evaluation and treatment of symptoms. To follow-up with her primary care provider for your other medical issues, concerns and or health care needs.   Signed: Laveda Abbe, NP 04/08/2021, 3:11 PM

## 2021-04-08 NOTE — B[facility] Group Notes (Signed)
Adult Psychoeducational Group Note  Date:  04/08/2021 Time:  5:10 AM  Group Topic/Focus:  Personal Choices and Values:   The focus of this group is to help patients assess and explore the importance of values in their lives, how their values affect their decisions, how they express their values and what opposes their expression.  Participation Level:  Minimal  Participation Quality:  Appropriate  Affect:  Appropriate  Cognitive:  Appropriate  Insight: Lacking  Engagement in Group:  Engaged  Modes of Intervention:  Discussion  Additional Comments  Jacalyn Lefevre 04/08/2021, 5:10 AM

## 2021-04-08 NOTE — Progress Notes (Addendum)
  D:  Pt presents with no complaints today.  Pt seemed brighter. Pt reports attending group, socializing, and participating in a game with peers today.  Pt reports speaking with family today. Pt reports that her father informed her that she has some job offers, so she wants to get discharged.  Pt also reports that she wants group support outside.  Pt denies SI/HI/AVH and verbally contracts for safety.  A:  Labs/Vitals monitored; Medication education provided; Pt encouraged to communicate concerns.  R:  Pt remains safe on unit with Q15 minute checks.  Will continue POC.     04/07/21 2050  Psych Admission Type (Psych Patients Only)  Admission Status Voluntary  Psychosocial Assessment  Patient Complaints None  Eye Contact Fair  Facial Expression Flat  Affect Depressed  Speech Unremarkable  Interaction Forwards little;Guarded  Motor Activity Other (Comment)  Appearance/Hygiene Unremarkable  Behavior Characteristics Cooperative;Anxious;Guarded  Mood Pleasant  Thought Process  Coherency Circumstantial  Content WDL  Delusions None reported or observed  Perception WDL  Hallucination None reported or observed  Judgment Impaired  Confusion None  Danger to Self  Current suicidal ideation? Denies  Danger to Others  Danger to Others None reported or observed

## 2021-04-08 NOTE — [facility] Suicide Risk Assessment (Signed)
[facility] Discharge Suicide Risk Assessment   Principal Problem: MDD (major depressive disorder), recurrent, severe, with psychosis (HCC) Discharge Diagnoses: Principal Problem:   MDD (major depressive disorder), recurrent, severe, with psychosis (HCC)   Total Time spent with patient: 15 minutes  Musculoskeletal: Strength & Muscle Tone: within normal limits Gait & Station: normal Patient leans: N/A  Psychiatric Specialty Exam  Presentation  General Appearance: Casual; Fairly Groomed  Eye Contact:Fleeting (Patient mainly looks at the floor when speaking.)  Speech:Clear and Coherent; Slow  Speech Volume:Normal  Handedness:Right   Mood and Affect  Mood:Depressed; Hopeless  Duration of Depression Symptoms: Greater than two weeks  Affect:Depressed   Thought Process  Thought Processes:Coherent; Goal Directed; Linear  Descriptions of Associations:Intact  Orientation:Full (Time, Place and Person)  Thought Content:Rumination (Patient ruminates on how she will always be depressed and how she is unable to have positive thoughts about herself.)  History of Schizophrenia/Schizoaffective disorder:No data recorded Duration of Psychotic Symptoms:No data recorded Hallucinations:Hallucinations: None  Ideas of Reference:None  Suicidal Thoughts:Suicidal Thoughts: No  Homicidal Thoughts:Homicidal Thoughts: No   Sensorium  Memory:Immediate Good; Recent Good; Remote Fair  Judgment:Fair  Insight:Fair   Executive Functions  Concentration:Fair  Attention Span:Fair  Recall:Fair  Fund of Knowledge:Fair  Language:Fair   Psychomotor Activity  Psychomotor Activity:Psychomotor Activity: Normal   Assets  Assets:Desire for Improvement; Financial Resources/Insurance; Social Support   Sleep  Sleep:Sleep: Fair (Patient stated that her sleep is not better because the mattress is not comfortable here, compared to home.) Number of Hours of Sleep: 6.75   Physical  Exam: Physical Exam Vitals and nursing note reviewed.  Constitutional:      Appearance: Normal appearance.  HENT:     Head: Normocephalic and atraumatic.  Pulmonary:     Effort: Pulmonary effort is normal.  Neurological:     General: No focal deficit present.     Mental Status: She is alert and oriented to person, place, and time.   Review of Systems  All other systems reviewed and are negative. Blood pressure 106/72, pulse 83, temperature 98 F (36.7 C), temperature source Oral, resp. rate 18, height 5\' 9"  (1.753 m), weight 82 kg, last menstrual period 03/23/2021, SpO2 100 %. Body mass index is 26.7 kg/m.  Mental Status Per Nursing Assessment::   On Admission:  Suicidal ideation indicated by patient  Demographic Factors:  NA  Loss Factors: NA  Historical Factors: Impulsivity  Risk Reduction Factors:   Sense of responsibility to family, Living with another person, especially a relative, and Positive social support  Continued Clinical Symptoms:  Depression:   Impulsivity  Cognitive Features That Contribute To Risk:  None    Suicide Risk:  Minimal: No identifiable suicidal ideation.  Patients presenting with no risk factors but with morbid ruminations; may be classified as minimal risk based on the severity of the depressive symptoms   Follow-up Information     [phone], MD. Call.   Specialty: Family Medicine Why: Please call to schedule a hospital follow up appointment with your primary care provider. Contact information: [address] [place] [phone]         [phone], [facility]. Go on 04/10/2021.   Why: You have a hospital follow up appointment for therapy and medication management services on 04/10/21 at 8:30 am.  This appointment will be held in person. Contact information: [address] [place] [phone]         [facility].  Schedule an appointment as  soon as possible for a visit.   Why: Please call to schedule an appointment with Oletta Lamas for therapy services after 04/14/21. Contact information: [address] [place] [address]  P: [phone], [phone]                Plan Of Care/Follow-up recommendations:  Activity:  ad lib  Antonieta Pert, MD 04/08/2021, 9:55 AM

## 2021-04-08 NOTE — Plan of Care (Signed)
Discharge Note:  D. Pt alert and oriented x 3. Denies SI and HI at present. Compliant with  medications. Pt assessed by MD. Discharged home as ordered.      A.  Emotional Support offered to Patient and encouragement provided. All belongings from locker returned to Patient at the time of discharge. Discharge instructions reveiwed with Patient residence resource forms given to  Patient..  R.  Pt verbalized understanding related to discharge instructions. Pt signed belonging sheet in agreement with items received from locker. Denies concerns at this time ambulatory with steady gait. Appears to be in no physical distress in time of discharge.

## 2021-05-01 ENCOUNTER — Other Ambulatory Visit: Payer: Self-pay

## 2021-05-01 ENCOUNTER — Encounter: Payer: Self-pay | Admitting: Behavioral Health

## 2021-05-01 ENCOUNTER — Ambulatory Visit (INDEPENDENT_AMBULATORY_CARE_PROVIDER_SITE_OTHER): Payer: 59 | Admitting: Behavioral Health

## 2021-05-01 VITALS — BP 105/65 | HR 75 | Ht 69.0 in | Wt 188.0 lb

## 2021-05-01 DIAGNOSIS — F332 Major depressive disorder, recurrent severe without psychotic features: Secondary | ICD-10-CM

## 2021-05-01 DIAGNOSIS — F411 Generalized anxiety disorder: Secondary | ICD-10-CM | POA: Diagnosis not present

## 2021-05-01 MED ORDER — ARIPIPRAZOLE 5 MG PO TABS: 5.0000 mg | ORAL_TABLET | Freq: Every day | ORAL | 1 refills | Status: DC

## 2021-05-01 MED ORDER — FLUOXETINE HCL 20 MG PO CAPS: 20.0000 mg | ORAL_CAPSULE | Freq: Every day | ORAL | 1 refills | Status: DC

## 2021-05-01 MED ORDER — FLUOXETINE HCL 20 MG PO CAPS: 20.0000 mg | ORAL_CAPSULE | Freq: Every day | ORAL | 0 refills | Status: DC

## 2021-05-01 MED ORDER — ARIPIPRAZOLE 5 MG PO TABS: 5.0000 mg | ORAL_TABLET | Freq: Every day | ORAL | 0 refills | Status: DC

## 2021-05-01 NOTE — Progress Notes (Addendum)
[facility] MD/PA/NP Initial Note  05/01/2021 5:15 PM Joyce [name]  MRN:  [id number]  Chief Complaint:  Chief Complaint   Anxiety; Establish Care; Eating Disorder; Depression; Medication Refill; Family Problem     HPI:   20 year old female presents to this office for initial visit and to establish care. She is also following up after presentation to the ER and evaluation for severe episode of major depressive episode without psychotic features. She said she was having some SI without a plan during that time but does not currently. She says that she has been struggling with anxiety and depression since her early teen years but has always suppressed her feelings. She say that after high school her depression increased. She says she had a problem with restricting food throughout her teen years but says diet has improved. She is still concerned about fluctuating weights. Says she has worked several jobs but they ended up not working out well. She says that she was diagnosed with hypothyroid disorder and is on medications. She says she passed out on one job because of that. She still lives at home and says her parents have added to her stress because they do not get along. Says her mom is Jehovah Witness and her Dad is Baptist. She says that her mom can be overbearing at time to the point she could not watch Disney movies because her mom found them inappropriate. Says her mom is not there for her emotionally and Dad has strong expectations for her because her brother smokes weed all the time and is unmotivated. She finds herself angry and tired of feeling this way. This led to her punching a glass door at home and having a psychological break. She feels like the medications, Prozac and Abilify have reduced the anger and allowed her to calm down. She says she is committed to getting better and finding a way to succeed with her life. She is also interested in psychotherapy. She says her anxiety today is 4/10  and depression is 4/10. She is sleeping 7-8 hours per night. No mania, no psychosis. No auditory or visual hallucinations. She verbally contracted for safety. No current SI or HI.   No previous psychiatric medication failures.  Visit Diagnosis:    ICD-10-CM   1. MDD (major depressive disorder), recurrent severe, without psychosis (HCC)  F33.2 FLUoxetine (PROZAC) 20 MG capsule    ARIPiprazole (ABILIFY) 5 MG tablet    DISCONTINUED: FLUoxetine (PROZAC) 20 MG capsule    DISCONTINUED: ARIPiprazole (ABILIFY) 5 MG tablet    2. Generalized anxiety disorder  F41.1       Past Psychiatric History:ER visit 04/01/2021  Past Medical History:  Past Medical History:  Diagnosis Date   Constipation    Hypopituitarism (HCC)    Left foot drop    Thyroid disease    History reviewed. No pertinent surgical history.  Family Psychiatric History: mom and dad depression  Family History:  Family History  Problem Relation Age of Onset   Anxiety disorder Mother    Depression Mother    Hypertension Mother    Depression Father    Healthy Father    Drug abuse Brother    Hypertension Paternal Grandmother    Diabetes Paternal Grandmother     Social History:  Social History   Socioeconomic History   Marital status: Single    Spouse name: Not on file   Number of children: 0   Years of education: college student now   [employer] education  level: Not on file  Occupational History   Occupation: student    Comment: Works at [employer]  Tobacco Use   Smoking status: Never   Smokeless tobacco: Never  Vaping Use   Vaping Use: Never used  Substance and Sexual Activity   Alcohol use: Not Currently   Drug use: Never   Sexual activity: Never  Other Topics Concern   Not on file  Social History Narrative   Lives at home with parents.   Right-handed.   No daily caffeine use.   Loves to draw, and write stories.    Social Determinants of Health   Financial Resource Strain: Not on file  Food  Insecurity: Not on file  Transportation Needs: Not on file  Physical Activity: Not on file  Stress: Not on file  Social Connections: Not on file    Allergies: No Known Allergies  Metabolic Disorder Labs: Lab Results  Component Value Date   HGBA1C 5.7 (H) 04/04/2021   MPG 117 04/04/2021   No results found for: PROLACTIN Lab Results  Component Value Date   CHOL 119 04/03/2021   TRIG 42 04/03/2021   HDL 59 04/03/2021   CHOLHDL 2.0 04/03/2021   VLDL 8 04/03/2021   LDLCALC 52 04/03/2021   Lab Results  Component Value Date   TSH 0.586 04/05/2021   TSH 0.275 (L) 04/03/2021    Therapeutic Level Labs: No results found for: LITHIUM No results found for: VALPROATE No components found for:  CBMZ  Current Medications: Current Outpatient Medications  Medication Sig Dispense Refill   ARIPiprazole (ABILIFY) 5 MG tablet Take 1 tablet (5 mg total) by mouth daily. 30 tablet 1   FLUoxetine (PROZAC) 20 MG capsule Take 1 capsule (20 mg total) by mouth daily. 30 capsule 1   levothyroxine (SYNTHROID) 100 MCG tablet Take 100 mcg by mouth every morning.     No current facility-administered medications for this visit.    Medication Side Effects: none  Orders placed this visit:  No orders of the defined types were placed in this encounter.   Psychiatric Specialty Exam:  Review of Systems  Constitutional:  Positive for appetite change and unexpected weight change.  Allergic/Immunologic: Negative.   Psychiatric/Behavioral:  Positive for dysphoric mood and sleep disturbance. The patient is nervous/anxious.    Blood pressure 105/65, pulse 75, height 5\' 9"  (1.753 m), weight 188 lb (85.3 kg).Body mass index is 27.76 kg/m.  General Appearance: Casual, Neat, and Well Groomed  Eye Contact:  Good  Speech:  Clear and Coherent  Volume:  Decreased  Mood:  Anxious and Depressed  Affect:  Appropriate, Depressed, Flat, and Anxious  Thought Process:  Coherent  Orientation:  Full (Time, Place,  and Person)  Thought Content: Logical   Suicidal Thoughts:  No  Homicidal Thoughts:  No  Memory:  WNL  Judgement:  Good  Insight:  Good  Psychomotor Activity:  Decreased  Concentration:  Concentration: Good  Recall:  Good  Fund of Knowledge: Good  Language: Good  Assets:  Desire for Improvement Intimacy Resilience Social Support  ADL's:  Intact  Cognition: WNL  Prognosis:  Good   Screenings:  AIMS    Flowsheet Row Admission (Discharged) [facility]  AIMS Total Score 0      AUDIT    Flowsheet Row Admission (Discharged) from [facility]  Alcohol Use Disorder Identification Test Final Score (AUDIT) 0  Flowsheet Row Admission (Discharged) from [facility]01[facility]2021 [facility] from 03/23/2021 in [facility]  C-SSRS RISK CATEGORY Moderate Risk Low Risk No Risk       Receiving Psychotherapy: No  Zoila Shutter has agreed to see. Waiting on scheduling to inform patient.   Recommendations/Treatment No changes recommended to medication regimen post hospitalization at this time.  Will follow up in 4 weeks to reassess  Continue on Abilify 5 mg tablet daily Continue Prozac  20 mg tab daily Will report any worsening symptoms or side effects promptly Greater than 50% of  60 min face to face time with patient was spent on counseling and coordination of care. We discussed long untreated hx of anxiety and depression. We discussed personal and cultural barriers that may have prevented her from seeking care for so long before symptoms became severe, and ER was utilized. She is interested in psychotherapy. We discussed long term personal goals that would assist in bringing emotional stability over time. Discussed her complicated  family dynamics and religious conflict between her parents.  Reviewed labs, patient is borderline pre DX     Joan Flores, NP

## 2021-05-08 ENCOUNTER — Other Ambulatory Visit: Payer: Self-pay | Admitting: Behavioral Health

## 2021-05-08 DIAGNOSIS — F332 Major depressive disorder, recurrent severe without psychotic features: Secondary | ICD-10-CM

## 2021-05-14 ENCOUNTER — Other Ambulatory Visit: Payer: Self-pay | Admitting: Behavioral Health

## 2021-05-14 ENCOUNTER — Telehealth: Payer: Self-pay | Admitting: Behavioral Health

## 2021-05-14 DIAGNOSIS — F332 Major depressive disorder, recurrent severe without psychotic features: Secondary | ICD-10-CM

## 2021-05-14 MED ORDER — ARIPIPRAZOLE 5 MG PO TABS: 10.0000 mg | ORAL_TABLET | Freq: Every day | ORAL | 1 refills | Status: DC

## 2021-05-14 NOTE — Telephone Encounter (Signed)
Ok thanks, This is what needs to happen and I have counseled  her about compliance with medications last visit.

## 2021-05-14 NOTE — Telephone Encounter (Signed)
Please have her increase her Abilify  to two tablets 10 mg at bedtime. Please tell her to make sure she is also take her Prozac 20 mg as prescribed everyday as well. If she tolerates ok we will look at meds again next appointment.

## 2021-05-14 NOTE — Telephone Encounter (Signed)
Please review

## 2021-05-14 NOTE — Telephone Encounter (Signed)
I informed her father.He stated she had a episode and broke a Ship broker today.Also she is still waiting for a counselor.

## 2021-05-14 NOTE — Telephone Encounter (Signed)
Pt called to report she is not feeling well depressed and a lot of headaches. Asking if she should increase her meds. Apt  9/15. Contact # [phone] & [phone].

## 2021-05-17 ENCOUNTER — Other Ambulatory Visit: Payer: Self-pay | Admitting: Behavioral Health

## 2021-05-17 DIAGNOSIS — F332 Major depressive disorder, recurrent severe without psychotic features: Secondary | ICD-10-CM

## 2021-05-20 NOTE — Telephone Encounter (Signed)
Joyce [name], can we change this to 10 mg 1 daily instead?

## 2021-05-22 ENCOUNTER — Other Ambulatory Visit: Payer: Self-pay | Admitting: Behavioral Health

## 2021-05-22 ENCOUNTER — Ambulatory Visit (INDEPENDENT_AMBULATORY_CARE_PROVIDER_SITE_OTHER): Payer: 59 | Admitting: Addiction (Substance Use Disorder)

## 2021-05-22 ENCOUNTER — Other Ambulatory Visit: Payer: Self-pay

## 2021-05-22 DIAGNOSIS — F333 Major depressive disorder, recurrent, severe with psychotic symptoms: Secondary | ICD-10-CM

## 2021-05-22 DIAGNOSIS — F332 Major depressive disorder, recurrent severe without psychotic features: Secondary | ICD-10-CM

## 2021-05-22 DIAGNOSIS — F411 Generalized anxiety disorder: Secondary | ICD-10-CM

## 2021-05-22 MED ORDER — ARIPIPRAZOLE 10 MG PO TABS: 10.0000 mg | ORAL_TABLET | Freq: Every day | ORAL | 1 refills | Status: DC

## 2021-05-22 NOTE — Progress Notes (Signed)
[facility] Counselor Initial Adult Exam  Name: Joyce [name] Date: 05/22/2021 MRN: [id number] DOB: 09/26/2000 PCP: Merri Brunette, MD  Time spent:  Reason for Visit Loman Chroman Problem: Client came in reporting some issues at home and internally. Client has a hx of suicidal thoughts last month. Client reported her medication stabilizing her depression and explosive outbursts. Therapist assessed for stability and safety of client and client showed no signs of & she denied SI/HI/AVH. Client with flat affect and has trouble articulating all her thoughts and feelings. Client reported feeling like a loner for years due to depression, but an inability to express her feelings. Client reported some history of explosions when she couldn't get rid of a feeling inside and having broken a mirror once. Client reported that her dad supports her in trying to calm down, but he and her mom's relationship is also a stressor for the client. Client's mom being a jehovah's witness has been a stressor in her life: feeling left and judged by her mom, losing time with her, and watching her pull away from the family completely. Client sufferes with issues related to her hypothyroid disorder and also had GI issues that caused a lot of pain and disordered eating in her high school years. Client reiterated to therapist that she feels like the Prozac and Abilify have helped her stabilize: calm down and reduce depression. Client motivated to get better and finding a way to succeed with her life. Therapist built rapport with client. Client participated in the treatment planning of their therapy. Therapist discussed coping skills for client when depressed and having passive SI. Client also reported agreeing to tell her parents or call 9-1-1 if she has any dark thoughts about not wanting to live. Client agreed with the plan if there is a crisis: contact after hours office line, call 9-1-1 and/or crisis line given by therapist.    Mental Status Exam:    Appearance:   Well Groomed     Behavior:  Appropriate and Sharing  Motor:  Psychomotor Retardation  Speech/Language:   Clear and Coherent and Slow  Affect:  Flat  Mood:  anxious and depressed  Thought process:  normal  Thought content:    Obsessions and Rumination  Sensory/Perceptual disturbances:    WNL  Orientation:  x4  Attention:  Good  Concentration:  Good  Memory:  WNL  Fund of knowledge:   Fair  Insight:    Fair  Judgment:   Good  Impulse Control:  Fair   Reported Symptoms:  lethargic, sad, isolates, low motivation, sleepy, down, inner frustration, explosive outbursts, loneliness, depression, anxiety, binge shopping,   Risk Assessment: Danger to Self:  No Self-injurious Behavior: No Danger to Others: No Duty to Warn:no Physical Aggression / Violence:No  Access to Firearms a concern: No  Gang Involvement:No  Patient / guardian was educated about steps to take if suicide or homicide risk level increases between visits: yes While future psychiatric events cannot be accurately predicted, the patient does not currently require acute inpatient psychiatric care and does not currently meet [facility] involuntary commitment criteria.  Substance Abuse History: Current substance abuse: No     Past Psychiatric History:   Previous psychological history is significant for anxiety and depression Outpatient Providers:Dr Candace Smith History of Psych Hospitalization: Yes - hx of suicidal ideation last month. Therapist discussed coping skills for client when depressed and having passive SI. Client also reported agreeing to tell her parents or call 9-1-1 if she has any dark  thoughts about not wanting to live.   Abuse History: Victim of No., emotional   Report needed: No. Victim of Neglect:Yes.   Perpetrator of  n/a   Witness / Exposure to Domestic Violence: No   Protective Services Involvement: No  Witness to [employer] Violence:  No   Family  History:  Family History  Problem Relation Age of Onset   Anxiety disorder Mother    Depression Mother    Hypertension Mother    Depression Father    Healthy Father    Drug abuse Brother    Hypertension Paternal Grandmother    Diabetes Paternal Grandmother     Living situation: the patient lives with their family- mom and dad. (Older brother lives outside the home.)  Sexual Orientation:  Straight  Relationship Status: single  Name of spouse / other: none             If a parent, number of children / ages: none  Support Systems; friends & dad  Financial Stress:  Yes   Income/Employment/Disability: Employment- works part time at [employer] History: Education: high school diploma/GED  Religion/Sprituality/World View:   Protestant- Baptist  Any cultural differences that may affect / interfere with treatment:  mom is TEFL teacher witness so no celebration of holidays in her home  Recreation/Hobbies: art, writing a children's book, time with friends, shopping  Stressors:Health problems   Marital or family conflict   Other: MH issues    Strengths:  Supportive Relationships, Spirituality, and Hopefulness  Legal History: Pending legal issue / charges: The patient has no significant history of legal issues. History of legal issue / charges:  none  Medical History/Surgical History:reviewed Past Medical History:  Diagnosis Date   Constipation    Hypopituitarism (HCC)    Left foot drop    Thyroid disease     Medications: Current Outpatient Medications  Medication Sig Dispense Refill   ARIPiprazole (ABILIFY) 5 MG tablet TAKE 2 TABLETS BY MOUTH EVERY DAY 60 tablet 1   FLUoxetine (PROZAC) 20 MG capsule Take 1 capsule (20 mg total) by mouth daily. 30 capsule 1   levothyroxine (SYNTHROID) 100 MCG tablet Take 100 mcg by mouth every morning.     No current facility-administered medications for this visit.   Diagnoses:    ICD-10-CM   1. MDD (major depressive  disorder), recurrent severe, without psychosis (HCC)  F33.2     Pre DX: Borderline Personality Disorder  Plan of Care: Client to return for twice a month therapy with Zoila Shutter, therapist, to review again in 6 months.  Client is to continue seeing medication provider for support of mood management.   Client to engage in CBT: challenging negative internal ruminations and self-talk AEB journaling daily or expressing thoughts to support persons in their life and then challenging it with truth.  Client to engage in tapping to tap in healthy cognition that challenges negative rumination or deep negative core belief.  Client to practice DBT distress tolerance skills to decrease crying spells and thoughts of not being able to endure their suffering AEB using mindfulness, deep breathing, and TIP to increase tolerance for discomfort, discharge emotional distress, and increase their understanding that they can do hard things.  Client to utilize BSP (brainspotting) with therapist to help client identify and process triggers for their depressive episode and anxiety with goal of reducing said SUDs caused by depression/anxiety by 33% in the next 6 months.  Client to prioritize sleep 8+ hours each week night AEB going  to bed by 10pm each night.   Pauline Good, LCSW, LCAS, CCTP, CCS

## 2021-05-29 ENCOUNTER — Ambulatory Visit: Payer: 59 | Admitting: Behavioral Health

## 2021-05-30 ENCOUNTER — Ambulatory Visit (INDEPENDENT_AMBULATORY_CARE_PROVIDER_SITE_OTHER): Payer: 59 | Admitting: Behavioral Health

## 2021-05-30 ENCOUNTER — Encounter: Payer: Self-pay | Admitting: Behavioral Health

## 2021-05-30 ENCOUNTER — Other Ambulatory Visit: Payer: Self-pay

## 2021-05-30 DIAGNOSIS — F411 Generalized anxiety disorder: Secondary | ICD-10-CM | POA: Diagnosis not present

## 2021-05-30 DIAGNOSIS — F330 Major depressive disorder, recurrent, mild: Secondary | ICD-10-CM

## 2021-05-30 DIAGNOSIS — F332 Major depressive disorder, recurrent severe without psychotic features: Secondary | ICD-10-CM

## 2021-05-30 MED ORDER — FLUOXETINE HCL 20 MG PO CAPS: 20.0000 mg | ORAL_CAPSULE | Freq: Every day | ORAL | 2 refills | Status: DC

## 2021-05-30 MED ORDER — ARIPIPRAZOLE 10 MG PO TABS: 10.0000 mg | ORAL_TABLET | Freq: Every day | ORAL | 2 refills | Status: DC

## 2021-05-30 NOTE — Progress Notes (Signed)
[facility] Med Check  Patient ID: Joyce [name],  MRN: [id number]  PCP: Merri Brunette, MD  Date of Evaluation: 05/30/2021 Time spent:20 minutes  Chief Complaint:  Chief Complaint   Depression; Anxiety; Medication Refill; Follow-up     HISTORY/CURRENT STATUS: HPI 20 year old african [place] female presents to this office for follow up and medication refill. Pt is very soft spoken and guarded. She says that she is doing well with no further ER visits or psychosis since last visit. Says that she still struggles with family issues at home. Says her anxiety today is 4/10 and depression is 3/10. She is sleeping 7 hours per night. She also says that she has been more social and getting out with friends more often. She does not want to change medication regimen at this time and would like to continue psychotherapy. She is seeing Zoila Shutter. Denies Mania, No psychosis, No SI/HI.  No previous psychiatric medication failures.   Individual Medical History/ Review of Systems: Changes? :No   Allergies: Patient has no known allergies.  Current Medications:  Current Outpatient Medications:    ARIPiprazole (ABILIFY) 10 MG tablet, Take 1 tablet (10 mg total) by mouth daily., Disp: 30 tablet, Rfl: 2   FLUoxetine (PROZAC) 20 MG capsule, Take 1 capsule (20 mg total) by mouth daily., Disp: 30 capsule, Rfl: 2   levothyroxine (SYNTHROID) 100 MCG tablet, Take 100 mcg by mouth every morning., Disp: , Rfl:  Medication Side Effects: none  Family Medical/ Social History: Changes? No  MENTAL HEALTH EXAM:  There were no vitals taken for this visit.There is no height or weight on file to calculate BMI.  General Appearance: Casual, Neat, and Well Groomed  Eye Contact:  Good  Speech:  Slow  Volume:  Decreased  Mood:  Anxious and Depressed  Affect:  Appropriate, Depressed, Restricted, and Anxious  Thought Process:  Coherent  Orientation:  Full (Time, Place, and Person)  Thought Content: Logical    Suicidal Thoughts:  No  Homicidal Thoughts:  No  Memory:  WNL  Judgement:  Good  Insight:  Good  Psychomotor Activity:  Normal  Concentration:  Concentration: Good  Recall:  Good  Fund of Knowledge: Fair  Language: Fair  Assets:  Desire for Improvement Resilience Social Support Talents/Skills  ADL's:  Intact  Cognition: WNL  Prognosis:  Good    DIAGNOSES:    ICD-10-CM   1. MDD (major depressive disorder), recurrent severe, without psychosis (HCC)  F33.2 ARIPiprazole (ABILIFY) 10 MG tablet    FLUoxetine (PROZAC) 20 MG capsule    2. Generalized anxiety disorder  F41.1 ARIPiprazole (ABILIFY) 10 MG tablet    3. Mild episode of recurrent major depressive disorder (HCC)  F33.0       Receiving Psychotherapy: Yes Zoila Shutter   RECOMMENDATIONS:  No changes recommended to medication regimen post hospitalization at this time.  Will follow up in 2 months to reassess  Continue on Abilify 5 mg tablet daily Continue Prozac  20 mg tab daily Will report any worsening symptoms or side effects promptly Greater than 50% of  20 min face to face time with patient was spent on counseling and coordination of care. We discussed her improvements with anxiety and depression. There has been no incidents of psychosis since last visit.  We discussed long term personal goals that would assist in bringing emotional stability over time. Discussed her continued complicated family dynamics and religious conflict between her parents.  Reviewed labs, patient is borderline pre DX  Arlys John  Collene Leyden, NP

## 2021-07-22 ENCOUNTER — Other Ambulatory Visit: Payer: Self-pay

## 2021-07-22 ENCOUNTER — Ambulatory Visit (INDEPENDENT_AMBULATORY_CARE_PROVIDER_SITE_OTHER): Payer: 59 | Admitting: Addiction (Substance Use Disorder)

## 2021-07-22 DIAGNOSIS — F332 Major depressive disorder, recurrent severe without psychotic features: Secondary | ICD-10-CM

## 2021-07-22 NOTE — Progress Notes (Signed)
[facility] Counselor/Therapist Progress Note  Patient ID: Joyce [name], MRN: [id number],    Date: 07/22/2021  Time Spent:  Treatment Type: Individual Therapy  Reported Symptoms: low motivation, anhedonia, anxious, feeling alone.  Mental Status Exam:  Appearance:   Well Groomed     Behavior:  Appropriate and Drowsy  Motor:  Normal  Speech/Language:   Clear and Coherent and Slow  Affect:  Blunt and Depressed  Mood:  depressed, irritable, labile, and sad  Thought process:  flight of ideas and loose associations  Thought content:    Obsessions and Rumination  Sensory/Perceptual disturbances:    WNL  Orientation:  x4  Attention:  Good  Concentration:  Fair  Memory:  WNL  Fund of knowledge:   Good  Insight:    Fair  Judgment:   Fair  Impulse Control:  Fair   Risk Assessment: Danger to Self:  No Self-injurious Behavior: No Danger to Others: No Duty to Warn:no Physical Aggression / Violence:No  Access to Firearms a concern: No  Gang Involvement:No   Subjective: Client feeling sad, lonely, tired, and depressed/empty all the time. Client reported feeling that her stress may be coming from home some, but also realizing that some of it is internal. Client feeling lonely and irritable, but unsure why shes completely irritable. However, reported that she feels she may need to move out sooner than later; clients parents agreed she should be out in the next 6 months to give her her own space to be an adult. Client agrees that it will help her develop and be more responsible. Client having to save all the money she is making working her two jobs to save for an apartment, however she struggles not to shop every day. Client reported taking her medications daily and them keeping her suicidal thoughts away, however it not alleviating the negative self talk away. Client feeling like "she cant seem to do anything right." Therapist using MI & CBT to support client and help her  process her thoughts and feelings while challenging the irrational negative self-talk and thoughts. Client insight is grown as she said the reason she feels bad about herself is: "she is always seeking others approval".   Interventions: Cognitive Behavioral Therapy, Mindfulness Meditation, and Humanistic/Existential  Diagnosis:   ICD-10-CM   1. MDD (major depressive disorder), recurrent severe, without psychosis (HCC)  F33.2       Plan of Care: Client to return for twice a month therapy with Zoila Shutter, therapist, to review again in 6 months.  Client is to continue seeing medication provider for support of mood management.   Client to engage in CBT: challenging negative internal ruminations and self-talk AEB journaling daily or expressing thoughts to support persons in their life and then challenging it with truth.  Client to engage in tapping to tap in healthy cognition that challenges negative rumination or deep negative core belief.  Client to practice DBT distress tolerance skills to decrease crying spells and thoughts of not being able to endure their suffering AEB using mindfulness, deep breathing, and TIP to increase tolerance for discomfort, discharge emotional distress, and increase their understanding that they can do hard things.  Client to utilize BSP (brainspotting) with therapist to help client identify and process triggers for their depressive episode and anxiety with goal of reducing said SUDs caused by depression/anxiety by 33% in the next 6 months.  Client to prioritize sleep 8+ hours each week night AEB going to bed by  10pm each night.   Barnie Del, LCSW, LCAS, CCTP, CCS

## 2021-08-01 ENCOUNTER — Telehealth: Payer: Self-pay | Admitting: Behavioral Health

## 2021-08-01 ENCOUNTER — Other Ambulatory Visit: Payer: Self-pay

## 2021-08-01 ENCOUNTER — Ambulatory Visit: Payer: 59 | Admitting: Behavioral Health

## 2021-08-01 DIAGNOSIS — F332 Major depressive disorder, recurrent severe without psychotic features: Secondary | ICD-10-CM

## 2021-08-01 DIAGNOSIS — F411 Generalized anxiety disorder: Secondary | ICD-10-CM

## 2021-08-01 MED ORDER — FLUOXETINE HCL 20 MG PO CAPS: 20.0000 mg | ORAL_CAPSULE | Freq: Every day | ORAL | 0 refills | Status: DC

## 2021-08-01 MED ORDER — ARIPIPRAZOLE 10 MG PO TABS: 10.0000 mg | ORAL_TABLET | Freq: Every day | ORAL | 0 refills | Status: DC

## 2021-08-01 NOTE — Telephone Encounter (Signed)
Rx sent 

## 2021-08-01 NOTE — Telephone Encounter (Signed)
Next visit is 08/11/21. Requesting refill on Fluoxetine and Abilify called to:  [id number] - [place], [place] - [address]

## 2021-08-11 ENCOUNTER — Ambulatory Visit: Payer: 59 | Admitting: Behavioral Health

## 2021-08-12 ENCOUNTER — Other Ambulatory Visit: Payer: Self-pay

## 2021-08-12 ENCOUNTER — Ambulatory Visit (INDEPENDENT_AMBULATORY_CARE_PROVIDER_SITE_OTHER): Payer: 59 | Admitting: Addiction (Substance Use Disorder)

## 2021-08-12 DIAGNOSIS — F332 Major depressive disorder, recurrent severe without psychotic features: Secondary | ICD-10-CM

## 2021-08-12 NOTE — Progress Notes (Signed)
      [facility] Counselor/Therapist Progress Note  Patient ID: Joyce [name], MRN: [id number],    Date: 08/12/2021  Time Spent:  Treatment Type: Individual Therapy  Reported Symptoms: sad  Mental Status Exam:  Appearance:   Well Groomed     Behavior:  Appropriate  Motor:  Normal  Speech/Language:   Clear and Coherent and Slow  Affect:  Depressed  Mood:  depressed, labile, and sad  Thought process:  normal  Thought content:    Obsessions and Rumination  Sensory/Perceptual disturbances:    WNL  Orientation:  x4  Attention:  Good  Concentration:  Fair  Memory:  WNL  Fund of knowledge:   Good  Insight:    Good  Judgment:   Fair  Impulse Control:  Fair   Risk Assessment: Danger to Self:  No Self-injurious Behavior: No Danger to Others: No Duty to Warn:no Physical Aggression / Violence:No  Access to Firearms a concern: No  Gang Involvement:No   Subjective: Client reported feeling peer pressured by her brother around thanksgiving to drink and reported the negative crowd he is a part of. Client expressed her lack of approval for his lifestyle but her hopeful wishes to rekindle a relationship. Client grieving she cant be around him for longer than a few minutes by herself after trying to give him a chance this weekend and having a failed attempt. Therapist used MI & grief therapy with client to validate her feelings and helps her process more of her grief around losing her previously close relationship with her brother. Client reported taking a enneagram test and learning she is a 6 (the Hotel manager) who is a committed, dependable person who seeks reassurance from her support system to reduce their own self-doubt. Client identified with what she learned about her personality type. Client processed how these affect her self talk and relationships and therapist used CBT to help client process her thoughts about herself and her interaction with the world. Therapist  assessed for client stability and client denied SI/HI/AVH.   Interventions: Cognitive Behavioral Therapy, Motivational Interviewing, and Grief Therapy  Diagnosis:   ICD-10-CM   1. MDD (major depressive disorder), recurrent severe, without psychosis (HCC)  F33.2       Plan of Care: Client to return for twice a month therapy with Zoila Shutter, therapist, to review again in 6 months.  Client is to continue seeing medication provider for support of mood management.   Client to engage in CBT: challenging negative internal ruminations and self-talk AEB journaling daily or expressing thoughts to support persons in their life and then challenging it with truth.  Client to engage in tapping to tap in healthy cognition that challenges negative rumination or deep negative core belief.  Client to practice DBT distress tolerance skills to decrease crying spells and thoughts of not being able to endure their suffering AEB using mindfulness, deep breathing, and TIP to increase tolerance for discomfort, discharge emotional distress, and increase their understanding that they can do hard things.  Client to utilize BSP (brainspotting) with therapist to help client identify and process triggers for their depressive episode and anxiety with goal of reducing said SUDs caused by depression/anxiety by 33% in the next 6 months.  Client to prioritize sleep 8+ hours each week night AEB going to bed by 10pm each night.   Pauline Good, LCSW, LCAS, CCTP, CCS

## 2021-08-28 ENCOUNTER — Emergency Department (HOSPITAL_COMMUNITY)
Admission: EM | Admit: 2021-08-28 | Discharge: 2021-08-28 | Disposition: A | Discharge status: Home / Self Care | Payer: 59 | Attending: Emergency Medicine | Admitting: Emergency Medicine

## 2021-08-28 ENCOUNTER — Other Ambulatory Visit: Payer: Self-pay

## 2021-08-28 ENCOUNTER — Encounter (HOSPITAL_COMMUNITY): Payer: Self-pay | Admitting: Emergency Medicine

## 2021-08-28 DIAGNOSIS — E039 Hypothyroidism, unspecified: Secondary | ICD-10-CM | POA: Diagnosis not present

## 2021-08-28 DIAGNOSIS — U071 COVID-19: Secondary | ICD-10-CM | POA: Insufficient documentation

## 2021-08-28 DIAGNOSIS — R55 Syncope and collapse: Secondary | ICD-10-CM

## 2021-08-28 DIAGNOSIS — R059 Cough, unspecified: Secondary | ICD-10-CM | POA: Diagnosis present

## 2021-08-28 DIAGNOSIS — Z79899 Other long term (current) drug therapy: Secondary | ICD-10-CM | POA: Insufficient documentation

## 2021-08-28 LAB — URINALYSIS, ROUTINE W REFLEX MICROSCOPIC
Bilirubin Urine: NEGATIVE
Glucose, UA: NEGATIVE mg/dL
Hgb urine dipstick: NEGATIVE
Ketones, ur: NEGATIVE mg/dL
Leukocytes,Ua: NEGATIVE
Nitrite: NEGATIVE
Protein, ur: NEGATIVE mg/dL
Specific Gravity, Urine: 1.02 (ref 1.005–1.030)
pH: 7 (ref 5.0–8.0)

## 2021-08-28 LAB — CBC
HCT: 43.3 % (ref 36.0–46.0)
Hemoglobin: 13.6 g/dL (ref 12.0–15.0)
MCH: 27.2 pg (ref 26.0–34.0)
MCHC: 31.4 g/dL (ref 30.0–36.0)
MCV: 86.6 fL (ref 80.0–100.0)
Platelets: 323 10*3/uL (ref 150–400)
RBC: 5 MIL/uL (ref 3.87–5.11)
RDW: 14.9 % (ref 11.5–15.5)
WBC: 8.7 10*3/uL (ref 4.0–10.5)
nRBC: 0 % (ref 0.0–0.2)

## 2021-08-28 LAB — BASIC METABOLIC PANEL
Anion gap: 8 (ref 5–15)
BUN: 15 mg/dL (ref 6–20)
CO2: 28 mmol/L (ref 22–32)
Calcium: 9.2 mg/dL (ref 8.9–10.3)
Chloride: 100 mmol/L (ref 98–111)
Creatinine, Ser: 0.82 mg/dL (ref 0.44–1.00)
GFR, Estimated: >60 mL/min (ref >60)
Glucose, Bld: 77 mg/dL (ref 70–99)
Potassium: 4.6 mmol/L (ref 3.5–5.1)
Sodium: 136 mmol/L (ref 135–145)

## 2021-08-28 LAB — RESP PANEL BY RT-PCR (FLU A&B, COVID) ARPGX2
Influenza A by PCR: NEGATIVE
Influenza B by PCR: NEGATIVE
SARS Coronavirus 2 by RT PCR: POSITIVE — AB

## 2021-08-28 LAB — I-STAT BETA HCG BLOOD, ED (MC, WL, AP ONLY): I-stat hCG, quantitative: <5 m[IU]/mL (ref <5)

## 2021-08-28 LAB — TSH: TSH: 0.676 u[IU]/mL (ref 0.350–4.500)

## 2021-08-28 LAB — CBG MONITORING, ED: Glucose-Capillary: 85 mg/dL (ref 70–99)

## 2021-08-28 MED ORDER — LACTATED RINGERS IV BOLUS
1000.0000 mL | Freq: Once | INTRAVENOUS | Status: AC
  Administered 2021-08-28: 1000 mL via INTRAVENOUS

## 2021-08-28 NOTE — ED Provider Notes (Signed)
Emergency Medicine Provider Triage Evaluation Note  Joyce [name] , a 20 y.o. female  was evaluated in triage.  Pt complains of syncopal episode. The patient reports she was feeling hot and dizzy and then passed out. She reports she was in and out since. She reports she had a syncopal episode "a long time ago from thyroid problems". Just recently had COVID.   Medical history of hypothyroidism. Takes Ariprazole and fluoxetine.  Review of Systems  Positive: Syncope, fatigue Negative: Chest pain, abdominal pain, unilateral weakness  Physical Exam  BP 113/74 (BP Location: Left Arm)    Pulse 70    Temp 98.3 F (36.8 C) (Oral)    Resp 16    Ht 5\' 9"  (1.753 m)    Wt 99.8 kg    LMP 08/19/2021 (Approximate)    SpO2 100%    BMI 32.49 kg/m  Gen:   Awake, no distress   Resp:  Normal effort  MSK:   Moves extremities without difficulty  Other:  No weakness, equal grip strength. No pronator drift  Medical Decision Making  Medically screening exam initiated at 11:10 AM.  Appropriate orders placed.  JENESE MISCHKE was informed that the remainder of the evaluation will be completed by another provider, this initial triage assessment does not replace that evaluation, and the importance of remaining in the ED until their evaluation is complete.  Labs ordered   [name], PA-C 08/28/21 1114    08/30/21, MD 08/29/21 [phone]

## 2021-08-28 NOTE — Discharge Instructions (Signed)
You were seen today for evaluation after syncopal episode.  It is likely due to your recent viral illness and returning to work too soon.  You were given IV fluids to increase your low blood pressure and to rehydrate you be as your recent illness.  Please with your PCP for evaluation of your thyroid level.  I given you a few days off of work to rehydrate and rest.  Please make sure to drink plenty of fluids, mainly water.  If you have any trouble speaking, worsening headache, numbness, tingling, weakness, or additional syncopal episodes, please return to the nearest emergency department for evaluation.

## 2021-08-28 NOTE — ED Triage Notes (Signed)
BIB EMS, pt had a syncopal event at work, felt hot and dizzy and went out, witnessed by coworkers, did not hit head, no injuries from fall. Reports dizziness.

## 2021-08-28 NOTE — ED Provider Notes (Signed)
[facility]   CSN: [id number] Arrival date & time: 08/28/21  1044     History No chief complaint on file.   Joyce [name] is a 20 y.o. female with history of hypopituitarism, hypothyroidism, MDD presents the emergency department for evaluation of syncopal episode today at work.  Today was her first day back to work after being out with COVID.  Patient reports she still lingering cough, however she does not have any nasal congestion, rhinorrhea, fevers, or sore throat anymore.  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  Her coworkers report that there is no head injury.  She reports that she was in and out of consciousness vaguely interburst people asking her questions.  She reports that she has had single episode 4 years ago when she had from with her thyroid.  She reports she does not feel dizzy or lightheaded now, she just feels fatigued. Denies any surgical history.  Daily medications include Abilify, Prozac, and Synthroid.  No known drug allergies.  She denies any tobacco, EtOH, or illicit drug use.  HPI     Past Medical History:  Diagnosis Date   Constipation    Hypopituitarism (HCC)    Left foot drop    Thyroid disease     Patient Active Problem List   Diagnosis Date Noted   MDD (major depressive disorder), recurrent, severe, with psychosis (HCC) 04/06/2021   MDD (major depressive disorder), recurrent severe, without psychosis (HCC) 04/01/2021   Common peroneal neuropathy of left lower extremity 06/08/2018    History reviewed. No pertinent surgical history.   OB History   No obstetric history on file.     Family History  Problem Relation Age of Onset   Anxiety disorder Mother    Depression Mother    Hypertension Mother    Depression Father    Healthy Father    Drug abuse Brother    Hypertension Paternal Grandmother    Diabetes Paternal  Grandmother     Social History   Tobacco Use   Smoking status: Never   Smokeless tobacco: Never  Vaping Use   Vaping Use: Never used  Substance Use Topics   Alcohol use: Not Currently   Drug use: Never    Home Medications Prior to Admission medications   Medication Sig Start Date End Date Taking? Authorizing Provider  ARIPiprazole (ABILIFY) 10 MG tablet Take 1 tablet (10 mg total) by mouth daily. 08/01/21   Joan Flores, NP  FLUoxetine (PROZAC) 20 MG capsule Take 1 capsule (20 mg total) by mouth daily. 08/01/21   Joan Flores, NP  levothyroxine (SYNTHROID) 100 MCG tablet Take 100 mcg by mouth every morning. 03/02/21   [provider]    Allergies    Patient has no known allergies.  Review of Systems   Review of Systems  Constitutional:  Positive for fatigue. Negative for chills and fever.  HENT:  Negative for ear pain and sore throat.   Eyes:  Negative for pain and visual disturbance.  Respiratory:  Negative for cough and shortness of breath.   Cardiovascular:  Negative for chest pain and palpitations.  Gastrointestinal:  Negative for abdominal pain and vomiting.  Genitourinary:  Negative for dysuria and hematuria.  Musculoskeletal:  Negative for arthralgias and back pain.  Skin:  Negative for color change and rash.  Neurological:  Positive for syncope, light-headedness and headaches. Negative  for tremors, seizures, facial asymmetry, speech difficulty and weakness.  All other systems reviewed and are negative.  Physical Exam Updated Vital Signs BP (!) 101/57    Pulse 71    Temp 98.3 F (36.8 C) (Oral)    Resp 13    Ht 5\' 9"  (1.753 m)    Wt 99.8 kg    LMP 08/19/2021 (Approximate)    SpO2 100%    BMI 32.49 kg/m   Physical Exam Vitals and nursing note reviewed.  Constitutional:      General: She is not in acute distress.    Appearance: Normal appearance. She is not toxic-appearing.  HENT:     Head: Normocephalic and atraumatic.     Right Ear: Tympanic  membrane, ear canal and external ear normal.     Left Ear: Tympanic membrane, ear canal and external ear normal.     Mouth/Throat:     Mouth: Mucous membranes are moist.  Eyes:     General: No scleral icterus.    Extraocular Movements: Extraocular movements intact.     Conjunctiva/sclera: Conjunctivae normal.     Pupils: Pupils are equal, round, and reactive to light.  Cardiovascular:     Rate and Rhythm: Normal rate and regular rhythm.  Pulmonary:     Effort: Pulmonary effort is normal. No respiratory distress.     Breath sounds: Normal breath sounds. No wheezing.     Comments: Clear to auscultation bilaterally.  Patient speaking in full sentences with ease.  No respiratory distress, tripoding, excess or muscle use, nasal flaring, or cyanosis present.  Patient satting 100% on room air. Abdominal:     General: Abdomen is flat. Bowel sounds are normal.     Palpations: Abdomen is soft.     Tenderness: There is no abdominal tenderness. There is no guarding or rebound.  Musculoskeletal:        General: No swelling, tenderness, deformity or signs of injury. Normal range of motion.     Cervical back: Normal range of motion. No rigidity or tenderness.     Comments: Strength 5 out of 5 upper and lower bilateral extremities.Pulses intact. Compartments soft.   Skin:    General: Skin is warm and dry.  Neurological:     General: No focal deficit present.     Mental Status: She is alert. Mental status is at baseline.     Cranial Nerves: No cranial nerve deficit.     Sensory: No sensory deficit.     Motor: No weakness.     Gait: Gait normal.     Comments: Cranial nerves II through XII intact. Sensation intact.  DTRs intact.  Ambulatory.    ED Results / Procedures / Treatments   Labs (all labs ordered are listed, but only abnormal results are displayed) Labs Reviewed  RESP PANEL BY RT-PCR (FLU A&B, COVID) [id number]  BASIC METABOLIC PANEL  CBC  URINALYSIS, ROUTINE W REFLEX MICROSCOPIC  TSH   CBG MONITORING, ED  CBG MONITORING, ED  I-STAT BETA HCG BLOOD, ED ([facility], [facility], [facility] ONLY)    EKG EKG Interpretation  Date/Time:  Thursday August 28 2021 10:58:42 EST Ventricular Rate:  70 PR Interval:  147 QRS Duration: 97 QT Interval:  373 QTC Calculation: 403 R Axis:   67 Text Interpretation: Sinus rhythm Borderline Q waves in inferior leads Confirmed by Dene Gentry [phone]) on 08/28/2021 1:38:59 PM  Radiology No results found.  Procedures Procedures   Medications Ordered in ED Medications  lactated ringers bolus 1,000 mL (  1,000 mLs Intravenous New Bag/Given 08/28/21 1429)    ED Course  I have reviewed the triage vital signs and the nursing notes.  Pertinent labs & imaging results that were available during my care of the patient were reviewed by me and considered in my medical decision making (see chart for details).  20 year old female presents emergency department for evaluation after syncopal episode today.  Patient recently got over [place] and this was her first day back at work.  Will order EKG and basic labs.  On exam patient has no neurodeficit.  Equal strength in bilateral upper and lower extremities.  Compartments soft.  Pulses intact bilaterally.  Cranial nerves intact.  No pronator drift. Low suspicion for stroke.  EKG shows sinus rhythm with borderline Q waves in the inferior leads, otherwise normal.  Negative hCG.  BMP and CBC unremarkable.  No electrolyte abnormality, anemia, or leukocytosis visualized.  CBG 85. TSH normal. Urine unremarkable.   Patient has some soft pressures, will order 1 L lactated Ringer's.  This syncopal episode is likely due to patient's recent COVID illness.  Her lab work is unremarkable.  She has a unremarkable neuro logical exam.  Reevaluation, the patient reports she is feeling much better.  Return precautions discussed with her.  I discussed with her that she would likely need a few days off of work to rest and recuperate along with  rehydrating due to her recent illness.  Patient agrees to plan.  Patient is stable being discharged home in good condition.    MDM Rules/Calculators/A&P                          Final Clinical Impression(s) / ED Diagnoses Final diagnoses:  Vasovagal syncope    Rx / DC Orders ED Discharge Orders     None        Sherrell Puller, PA-C 08/28/21 1502    Valarie Merino, MD 09/01/21 [phone]

## 2021-09-01 ENCOUNTER — Ambulatory Visit: Payer: 59 | Admitting: Addiction (Substance Use Disorder)

## 2021-09-02 ENCOUNTER — Telehealth (HOSPITAL_COMMUNITY): Payer: Self-pay | Admitting: Professional

## 2021-09-02 ENCOUNTER — Ambulatory Visit (HOSPITAL_COMMUNITY)
Admission: EM | Admit: 2021-09-02 | Discharge: 2021-09-02 | Disposition: A | Discharge status: Home / Self Care | Payer: 59 | Attending: Student | Admitting: Student

## 2021-09-02 ENCOUNTER — Encounter (HOSPITAL_COMMUNITY): Payer: Self-pay | Admitting: Student

## 2021-09-02 ENCOUNTER — Other Ambulatory Visit: Payer: Self-pay

## 2021-09-02 DIAGNOSIS — F332 Major depressive disorder, recurrent severe without psychotic features: Secondary | ICD-10-CM | POA: Insufficient documentation

## 2021-09-02 DIAGNOSIS — Z20822 Contact with and (suspected) exposure to covid-19: Secondary | ICD-10-CM | POA: Diagnosis not present

## 2021-09-02 DIAGNOSIS — E039 Hypothyroidism, unspecified: Secondary | ICD-10-CM | POA: Diagnosis not present

## 2021-09-02 DIAGNOSIS — R45851 Suicidal ideations: Secondary | ICD-10-CM | POA: Insufficient documentation

## 2021-09-02 DIAGNOSIS — F411 Generalized anxiety disorder: Secondary | ICD-10-CM | POA: Insufficient documentation

## 2021-09-02 LAB — RESP PANEL BY RT-PCR (FLU A&B, COVID) ARPGX2
Influenza A by PCR: NEGATIVE
Influenza B by PCR: NEGATIVE
SARS Coronavirus 2 by RT PCR: NEGATIVE

## 2021-09-02 LAB — POCT URINE DRUG SCREEN - MANUAL ENTRY (I-SCREEN)
POC Amphetamine UR: NOT DETECTED
POC Buprenorphine (BUP): NOT DETECTED
POC Cocaine UR: NOT DETECTED
POC Marijuana UR: NOT DETECTED
POC Methadone UR: NOT DETECTED
POC Methamphetamine UR: NOT DETECTED
POC Morphine: NOT DETECTED
POC Oxazepam (BZO): NOT DETECTED
POC Oxycodone UR: NOT DETECTED
POC Secobarbital (BAR): NOT DETECTED

## 2021-09-02 LAB — POC SARS CORONAVIRUS 2 AG: SARSCOV2ONAVIRUS 2 AG: NEGATIVE

## 2021-09-02 LAB — POCT PREGNANCY, URINE: Preg Test, Ur: NEGATIVE

## 2021-09-02 MED ORDER — ALUM & MAG HYDROXIDE-SIMETH 200-200-20 MG/5ML PO SUSP: 30.0000 mL | ORAL | Status: DC | PRN

## 2021-09-02 MED ORDER — LEVOTHYROXINE SODIUM 100 MCG PO TABS
100.0000 ug | ORAL_TABLET | Freq: Every morning | ORAL | Status: DC
  Administered 2021-09-02: 05:00:00 100 ug via ORAL
  Filled 2021-09-02: qty 1

## 2021-09-02 MED ORDER — FLUOXETINE HCL 20 MG PO CAPS
20.0000 mg | ORAL_CAPSULE | Freq: Every day | ORAL | Status: DC
  Administered 2021-09-02: 10:00:00 20 mg via ORAL
  Filled 2021-09-02: qty 1

## 2021-09-02 MED ORDER — MAGNESIUM HYDROXIDE 400 MG/5ML PO SUSP: 30.0000 mL | Freq: Every day | ORAL | Status: DC | PRN

## 2021-09-02 MED ORDER — ACETAMINOPHEN 325 MG PO TABS: 650.0000 mg | ORAL_TABLET | Freq: Four times a day (QID) | ORAL | Status: DC | PRN

## 2021-09-02 MED ORDER — TRAZODONE HCL 50 MG PO TABS: 50.0000 mg | ORAL_TABLET | Freq: Every evening | ORAL | Status: DC | PRN

## 2021-09-02 MED ORDER — HYDROXYZINE HCL 25 MG PO TABS: 25.0000 mg | ORAL_TABLET | Freq: Three times a day (TID) | ORAL | Status: DC | PRN

## 2021-09-02 MED ORDER — ARIPIPRAZOLE 10 MG PO TABS: 10.0000 mg | ORAL_TABLET | Freq: Every day | ORAL | Status: DC

## 2021-09-02 NOTE — ED Notes (Signed)
Pt A&Ox4 during admission assessment. Pt guarded and tearful during assessment and attempted blood draw. Pts mother present during assessment and very cooperative and helpful. Pt endorses passive SI with no HI or AVH. No signs of acute distress after RN assured blood draw would be deferred until later. Will continue to monitor for safety.

## 2021-09-02 NOTE — ED Notes (Signed)
Pt jerked arm after insertion of needle during blood draw with needle coming out. Pt started crying and told RN to stop during 2nd attempt as soon as needle inserted. Unable to get blood draw at this time.

## 2021-09-02 NOTE — Progress Notes (Signed)
Patient resting quietly with eyes closed and respirations even and unlabored at this time. Nursing staff will continue to monitor.

## 2021-09-02 NOTE — Discharge Instructions (Addendum)
Patient is instructed prior to discharge to: Take all medications as prescribed by his/her mental healthcare provider. Report any adverse effects and or reactions from the medicines to his/her outpatient provider promptly. Patient has been instructed & cautioned: To not engage in alcohol and or illegal drug use while on prescription medicines. In the event of worsening symptoms, patient is instructed to call the crisis hotline, 911 and or go to the nearest ED for appropriate evaluation and treatment of symptoms. To follow-up with his/her primary care provider for your other medical issues, concerns and or health care needs.  The suicide prevention education provided includes the following: Suicide risk factors Suicide prevention and interventions National Suicide Hotline telephone number [facility] assessment telephone number [place] Emergency Assistance [address] [place] and/or Residential Mobile Crisis Unit telephone number   Request made of family/significant other to: Remove weapons (e.g., guns, rifles, knives), all items previously/currently identified as safety concern.   Remove drugs/medications (over the counter, prescriptions, illicit drugs), all items previously/currently identified as a safety concern.  

## 2021-09-02 NOTE — ED Notes (Signed)
Patient denies SI, HI and AVH, pain 0/10. Patient discharged with After Visit summary. Discharge information explained about follow up care and community resources and upcoming appointments. Patient received all belongings from [facility] locker. Patient left with Father in personal vehicle.
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
## 2021-09-02 NOTE — ED Provider Notes (Addendum)
Behavioral Health Admission H&P [facility] & OBS)  Date: 09/02/21 Patient Name: Joyce [name] MRN: [id number] Chief Complaint:  Chief Complaint  Patient presents with   Depression   Suicidal      Diagnoses:  Final diagnoses:  MDD (major depressive disorder), recurrent severe, without psychosis ([place])  Generalized anxiety disorder  Suicidal ideation    HPI: Joyce [name] is a 20 year old female with past psychiatric history significant for MDD and generalized anxiety disorder, and past medical history significant for hypothyroidism who presents to the [facility] behavioral health urgent care [facility]) voluntarily accompanied by her mother Aala [id number] [phone]) and father for walk-in evaluation for concerns regarding worsening depressive symptoms and SI.  With patient's consent/Per patient's request, patient's mother present during the evaluation and information was obtained from patient and patient's mother during the evaluation.  Patient's mother reports that the patient has been having "difficulty coping, dealing with life, anxiety, and not being what she feels like she would be".  Patient's mother also reports that she thinks that the patient needs to develop new coping mechanisms to deal with stressors.  Patient reports that she is feeling "overwhelmed, just tired, very sad".  Patient also reports "I'm just trying to change.  I just want to be better".  Per chart review, patient was psychiatrically hospitalized at [facility]) from 04/03/2021 to 04/08/2021.  Patient and patient's mother report that the patient's depressive symptoms have improved since this July 2022 [facility] admission noted above.  Patient and patient's mother deny any additional inpatient psychiatric hospitalizations since the behavioral health hospital admission noted above.  Patient states that her main stressors are that she has lack of motivation to perform her daily tasks such as cleaning her  room, cleaning the kitchen, or doing laundry due to her depression, and patient reports that this leads to her getting into verbal altercations with her mother and father which increases her stress and feelings of depression/anxiety.  Patient endorses passive SI currently on exam with no suicidal plan at this time.  She reports that yesterday on 08/31/2021, she had suicidal ideation with thoughts of wanting to "run my car off the road", but patient denies actually attempting to drive her car off of the road at that time.  She denies history of any past suicide attempts.  Patient's mother reports that "I don't think she would try to harm herself.  I just think she wants to feel better".  Patient's mother reports that the patient made a passive suicidal statement to her yesterday on 09/01/2021 or on 08/31/2021 which consisted of the patient stating "maybe she'd be better off dead".  Mother denies the patient mentioning any suicidal plans to her.  She denies history of self-injurious behavior via intentionally cutting or burning herself.  She denies homicidal ideations.  She denies AVH.  She denies feelings of paranoia.  Patient describes her sleep as poor, but she states that she is not sure how many hours she sleeps per night on average.  She reports that she struggles with falling asleep and staying asleep.  She denies nightmares.  She endorses feelings of anhedonia and feelings of guilt, hopelessness, and worthlessness over the past 2 to 3 months.  She endorses poor energy and concentration over the past few months as well.  She denies appetite or weight changes over the past few months.  She does report having a lack of social life in terms of having friends that she can spend time with.  Patient's mother and the patient report that the patient has been seeing a psychiatric provider Avelina Laine, NP) and therapist Zoila Shutter) at [place].  Per patient and per chart review, patient's last psychiatry visit  with [facility] was on 05/30/2021.  Per chart review of this 05/30/2021 psychiatry visit, it was documented for patient to continue Prozac 20 mg daily and Abilify 10 mg daily.  Patient reports that she still currently takes Prozac 20 mg p.o. daily in the morning and Abilify 10 mg p.o. daily at bedtime.  She reports that she occasionally forgets to take her medications but she reports that she took both of her medications yesterday on 09/01/2021.  She denies taking any additional psychotropic medications at this time.  She states that she does not have another psychiatry appointment scheduled at this time.  She states that she was seeing her therapist once every other week.  She reports that her last therapy appointment was in the beginning of December 2022.  Per chart review, it appears that patient's last therapy appointment was on 08/12/2021.  Patient states that she was supposed to have a therapy appointment this past Friday, 08/29/2021 but for some reason this appointment was canceled.  Patient's mother reports that she believes the patient was diagnosed with ADHD in elementary school, but patient and patient's mother deny history of the patient ever taking ADHD medication in the past or currently.  Patient lives in [place] with her mother and father.  Patient's mother reports that there are multiple firearms in the home, the patient's mother is adamant that these firearms are locked up and secured.  Patient denies any alcohol, tobacco/nicotine, or illicit substance use.  Patient is currently employed at [employer].  Patient and patient's mother report that the patient was previously going to college but that the patient had to stop attending college in 2018/2019 due to patient developing severe issues with her thyroid that contributed to the patient had a 70 pound weight loss and becoming very ill.  Patient is currently on daily Synthroid and follows with endocrinology.  Of note, per  chart review, patient had a positive PCR COVID test on 08/28/2021 at [facility] ED.  Additionally, per chart review of outside/out-of-network records, it is documented that patient had a positive PCR COVID test on 08/19/2021 through [facility].  Additionally, chart review also shows that patient had a negative rapid COVID test result on 09/01/2021 from [facility].  Patient reports that she went back to work on 08/28/2021 after quarantine due to her tested positive for COVID on 08/19/2021.  She endorses having a slight intermittent nonproductive cough, but she denies having any additional respiratory symptoms or any additional physical symptoms at this time.  She reports that her respiratory symptoms have improved since she tested positive for COVID on 08/19/2021 and she states that her respiratory symptoms are continuing to improve.  Patient afebrile on exam with temperature 98.4 F.  Vital signs stable: Blood pressure 111/72, respirations 18, SPO2 100% on room air, pulse slightly bradycardic at 59 bpm but unremarkable (patient's respirations even and unlabored with normal respiratory effort and patient not showing any signs of acute distress at this time).  Patient is not ill-appearing, toxic appearing, or diaphoretic at this time.  On exam, patient is sitting upright in no acute distress.  Eye contact is fair and fleeting.  Speech is clear and coherent, slow with decreased speech volume.  Mood is depressed with congruent, flat  affect.  Thought process is coherent, goal directed, and linear.  Patient is alert and oriented x4, cooperative, and answers all questions appropriately during the evaluation.  No indication of patient is responding to internal/external stimuli.  No delusional thought content noted on exam.  PHQ 2-9:   Flowsheet Row [facility] (Discharged) from OP [facility] from [facility]/01/2021 in [facility]  C-SSRS RISK CATEGORY No Risk Moderate Risk Low Risk        Total Time spent with patient: 30 minutes  Musculoskeletal  Strength & Muscle Tone: within normal limits Gait & Station: normal Patient leans: N/A  Psychiatric Specialty Exam  Presentation General Appearance: Appropriate for Environment; Fairly Groomed  Eye Contact:Fair; Contractorleeting  Speech:Clear and Coherent; Slow  Speech Volume:Decreased  Handedness:Right   Mood and Affect  Mood:Depressed  Affect:Congruent; Flat   Thought Process  Thought Processes:Coherent; Goal Directed; Linear  Descriptions of Associations:Intact  Orientation:Full (Time, Place and Person)  Thought Content:Logical; WDL    Hallucinations:Hallucinations: None  Ideas of Reference:None  Suicidal Thoughts:Suicidal Thoughts: Yes, Passive SI Passive Intent and/or Plan: Without Intent; Without Plan; Without Means to Carry Out; With Access to Means  Homicidal Thoughts:Homicidal Thoughts: No   Sensorium  Memory:Immediate Fair; Recent Fair; Remote Fair  Judgment:Good  Insight:Fair   Executive Functions  Concentration:Fair  Attention Span:Fair  Recall:Fair  Fund of Knowledge:Fair  Language:Good   Psychomotor Activity  Psychomotor Activity:Psychomotor Activity: Normal   Assets  Assets:Communication Skills; Desire for Improvement; Financial Resources/Insurance; Housing; Leisure Time; Physical Health; Resilience; Social Support; Talents/Skills; Vocational/Educational; Transportation   Sleep  Sleep:Sleep: Poor   Nutritional Assessment (For OBS and FBC admissions only) Has the patient had a weight loss or gain of 10 pounds or more in the last 3 months?: No Has the patient had a decrease in food intake/or appetite?: No Does the patient have dental problems?: No Does the patient have eating habits or behaviors that may be  indicators of an eating disorder including binging or inducing vomiting?: No Has the patient recently lost weight without trying?: 0 Has the patient been eating poorly because of a decreased appetite?: 0 Malnutrition Screening Tool Score: 0    Physical Exam Vitals reviewed.  Constitutional:      General: She is not in acute distress.    Appearance: She is not ill-appearing, toxic-appearing or diaphoretic.  HENT:     Head: Normocephalic and atraumatic.     Right Ear: External ear normal.     Left Ear: External ear normal.     Nose: Nose normal.  Eyes:     General:        Right eye: No discharge.        Left eye: No discharge.     Conjunctiva/sclera: Conjunctivae normal.     Comments: Patient wearing glasses.  Cardiovascular:     Comments: Slight bradycardia noted with pulse of 59 bpm. Pulmonary:     Effort: Pulmonary effort is normal. No respiratory distress.  Musculoskeletal:        General: Normal range of motion.     Cervical back: Normal range of motion.  Neurological:     General: No focal deficit present.     Mental Status: She is alert and oriented to person, place, and time.     Comments: No tremor noted.   Psychiatric:        Attention and Perception:  She does not perceive auditory or visual hallucinations.        Mood and Affect: Mood is depressed.        Behavior: Behavior is withdrawn. Behavior is not agitated, aggressive, hyperactive or combative. Behavior is cooperative.        Thought Content: Thought content is not paranoid or delusional. Thought content includes suicidal ideation. Thought content does not include homicidal ideation. Thought content does not include suicidal plan.     Comments: Affect flat and mood-congruent.  Speech is clear and coherent with decreased volume and rate.    Review of Systems  Constitutional:  Positive for malaise/fatigue. Negative for chills, diaphoresis, fever and weight loss.  HENT:  Negative for congestion.   Respiratory:   Negative for shortness of breath.        + for slight intermittent cough   Cardiovascular:  Negative for chest pain and palpitations.  Gastrointestinal:  Negative for abdominal pain, constipation, diarrhea, nausea and vomiting.  Musculoskeletal:  Negative for joint pain and myalgias.  Neurological:  Negative for dizziness and headaches.  Psychiatric/Behavioral:  Positive for depression and suicidal ideas. Negative for hallucinations, memory loss and substance abuse. The patient is nervous/anxious and has insomnia.   All other systems reviewed and are negative.  Vitals: Blood pressure 101/72, pulse (!) 59, temperature 98.4 F (36.9 C), temperature source Oral, resp. rate 18, last menstrual period 08/19/2021, SpO2 100 %. There is no height or weight on file to calculate BMI.  Past Psychiatric History: past psychiatric history significant for MDD and generalized anxiety disorder.  Please see HPI for further details regarding patient's past psychiatric history.  Is the patient at risk to self? Yes  Has the patient been a risk to self in the past 6 months? Yes .    Has the patient been a risk to self within the distant past? No   Is the patient a risk to others? No   Has the patient been a risk to others in the past 6 months? No   Has the patient been a risk to others within the distant past? No   Past Medical History:  Past Medical History:  Diagnosis Date   Constipation    Hypopituitarism (HCC)    Left foot drop    Thyroid disease    History reviewed. No pertinent surgical history.  Family History:  Family History  Problem Relation Age of Onset   Anxiety disorder Mother    Depression Mother    Hypertension Mother    Depression Father    Healthy Father    Drug abuse Brother    Hypertension Paternal Grandmother    Diabetes Paternal Grandmother     Social History:  Social History   Socioeconomic History   Marital status: Single    Spouse name: Not on file   Number of  children: 0   Years of education: college student now   [employer] education level: Not on file  Occupational History   Occupation: Consulting civil engineer    Comment: Works at [employer]  Tobacco Use   Smoking status: Never   Smokeless tobacco: Never  Vaping Use   Vaping Use: Never used  Substance and Sexual Activity   Alcohol use: Not Currently   Drug use: Never   Sexual activity: Never  Other Topics Concern   Not on file  Social History Narrative   Lives at home with parents.   Right-handed.   No daily caffeine use.   Loves to draw, and  write stories.    Social Determinants of Health   Financial Resource Strain: Not on file  Food Insecurity: Not on file  Transportation Needs: Not on file  Physical Activity: Not on file  Stress: Not on file  Social Connections: Not on file  Intimate Partner Violence: Not on file    SDOH:  SDOH Screenings   Alcohol Screen: Low Risk    Last Alcohol Screening Score (AUDIT): 0  Depression (PHQ2-9): Not on file  Financial Resource Strain: Not on file  Food Insecurity: Not on file  Housing: Not on file  Physical Activity: Not on file  Social Connections: Not on file  Stress: Not on file  Tobacco Use: Low Risk    Smoking Tobacco Use: Never   Smokeless Tobacco Use: Never   Passive Exposure: Not on file  Transportation Needs: Not on file    Last Labs:  Admission on 09/02/2021  Component Date Value Ref Range Status   POC Amphetamine UR 09/02/2021 None Detected  NONE DETECTED (Cut Off Level 1000 ng/mL) Final   POC Secobarbital (BAR) 09/02/2021 None Detected  NONE DETECTED (Cut Off Level 300 ng/mL) Final   POC Buprenorphine (BUP) 09/02/2021 None Detected  NONE DETECTED (Cut Off Level 10 ng/mL) Final   POC Oxazepam (BZO) 09/02/2021 None Detected  NONE DETECTED (Cut Off Level 300 ng/mL) Final   POC Cocaine UR 09/02/2021 None Detected  NONE DETECTED (Cut Off Level 300 ng/mL) Final   POC Methamphetamine UR 09/02/2021 None Detected  NONE DETECTED (Cut  Off Level 1000 ng/mL) Final   POC Morphine 09/02/2021 None Detected  NONE DETECTED (Cut Off Level 300 ng/mL) Final   POC Oxycodone UR 09/02/2021 None Detected  NONE DETECTED (Cut Off Level 100 ng/mL) Final   POC Methadone UR 09/02/2021 None Detected  NONE DETECTED (Cut Off Level 300 ng/mL) Final   POC Marijuana UR 09/02/2021 None Detected  NONE DETECTED (Cut Off Level 50 ng/mL) Final   Preg Test, Ur 09/02/2021 NEGATIVE  NEGATIVE Final   Comment:        THE SENSITIVITY OF THIS METHODOLOGY IS >24 mIU/mL    SARSCOV2ONAVIRUS 2 AG 09/02/2021 NEGATIVE  NEGATIVE Final   Comment: (NOTE) SARS-CoV-2 antigen NOT DETECTED.   Negative results are presumptive.  Negative results do not preclude SARS-CoV-2 infection and should not be used as the sole basis for treatment or other patient management decisions, including infection  control decisions, particularly in the presence of clinical signs and  symptoms consistent with COVID-19, or in those who have been in contact with the virus.  Negative results must be combined with clinical observations, patient history, and epidemiological information. The expected result is Negative.  Fact Sheet for Patients: HandmadeRecipes.com.cy  Fact Sheet for Healthcare Providers: FuneralLife.at  This test is not yet approved or cleared by the [place] FDA and  has been authorized for detection and/or diagnosis of SARS-CoV-2 by FDA under an Emergency Use Authorization (EUA).  This EUA will remain in effect (meaning this test can be used) for the duration of  the COV                          ID-19 declaration under Section 564(b)(1) of the Act, 21 U.S.C. section 360bbb-3(b)(1), unless the authorization is terminated or revoked sooner.    Admission on 08/28/2021, Discharged on 08/28/2021  Component Date Value Ref Range Status   Glucose-Capillary 08/28/2021 85  70 - 99 mg/dL Final   Glucose reference  range  applies only to samples taken after fasting for at least 8 hours.   Sodium 08/28/2021 136  135 - 145 mmol/L Final   Potassium 08/28/2021 4.6  3.5 - 5.1 mmol/L Final   Chloride 08/28/2021 100  98 - 111 mmol/L Final   CO2 08/28/2021 28  22 - 32 mmol/L Final   Glucose, Bld 08/28/2021 77  70 - 99 mg/dL Final   Glucose reference range applies only to samples taken after fasting for at least 8 hours.   BUN 08/28/2021 15  6 - 20 mg/dL Final   Creatinine, Ser 08/28/2021 0.82  0.44 - 1.00 mg/dL Final   Calcium 08/28/2021 9.2  8.9 - 10.3 mg/dL Final   GFR, Estimated 08/28/2021 >60  >60 mL/min Final   Comment: (NOTE) Calculated using the CKD-EPI Creatinine Equation (2021)    Anion gap 08/28/2021 8  5 - 15 Final   Performed at [facility], [place] [address], [place], [place] [address]   WBC 08/28/2021 8.7  4.0 - 10.5 K/uL Final   RBC 08/28/2021 5.00  3.87 - 5.11 MIL/uL Final   Hemoglobin 08/28/2021 13.6  12.0 - 15.0 g/dL Final   HCT 08/28/2021 43.3  36.0 - 46.0 % Final   MCV 08/28/2021 86.6  80.0 - 100.0 fL Final   MCH 08/28/2021 27.2  26.0 - 34.0 pg Final   MCHC 08/28/2021 31.4  30.0 - 36.0 g/dL Final   RDW 08/28/2021 14.9  11.5 - 15.5 % Final   Platelets 08/28/2021 323  150 - 400 K/uL Final   nRBC 08/28/2021 0.0  0.0 - 0.2 % Final   Performed at [facility], [place] [address] [place], [place] [address]   Color, Urine 08/28/2021 YELLOW  YELLOW Final   APPearance 08/28/2021 CLEAR  CLEAR Final   Specific Gravity, Urine 08/28/2021 1.020  1.005 - 1.030 Final   pH 08/28/2021 7.0  5.0 - 8.0 Final   Glucose, UA 08/28/2021 NEGATIVE  NEGATIVE mg/dL Final   Hgb urine dipstick 08/28/2021 NEGATIVE  NEGATIVE Final   Bilirubin Urine 08/28/2021 NEGATIVE  NEGATIVE Final   Ketones, ur 08/28/2021 NEGATIVE  NEGATIVE mg/dL Final   Protein, ur 08/28/2021 NEGATIVE  NEGATIVE mg/dL Final   Nitrite 08/28/2021 NEGATIVE  NEGATIVE Final   Leukocytes,Ua 08/28/2021 NEGATIVE  NEGATIVE  Final   Comment: Microscopic not done on urines with negative protein, blood, leukocytes, nitrite, or glucose < 500 mg/dL. Performed at [facility], [place] [address] [place], [place] [address]    I-stat hCG, quantitative 08/28/2021 <5.0  <5 mIU/mL Final   Comment 3 08/28/2021          Final   Comment:   GEST. AGE      CONC.  (mIU/mL)   <=1 WEEK        5 - 50     2 WEEKS       50 - 500     3 WEEKS       100 - 10,000     4 WEEKS     1,000 - 30,000        FEMALE AND NON-PREGNANT FEMALE:     LESS THAN 5 mIU/mL    SARS Coronavirus 2 by RT PCR 08/28/2021 POSITIVE (A)  NEGATIVE Final   Comment: (NOTE) SARS-CoV-2 target nucleic acids are DETECTED.  The SARS-CoV-2 RNA is generally detectable in upper respiratory specimens during the acute phase of infection. Positive results are indicative of the presence of the  identified virus, but do not rule out bacterial infection or co-infection with other pathogens not detected by the test. Clinical correlation with patient history and other diagnostic information is necessary to determine patient infection status. The expected result is Negative.  Fact Sheet for Patients: EntrepreneurPulse.com.au  Fact Sheet for Healthcare Providers: IncredibleEmployment.be  This test is not yet approved or cleared by the [place] FDA and  has been authorized for detection and/or diagnosis of SARS-CoV-2 by FDA under an Emergency Use Authorization (EUA).  This EUA will remain in effect (meaning this test can be used) for the duration of  the COVID-19 declaration under Section 564(b)(1) of the A                          ct, 21 U.S.C. section 360bbb-3(b)(1), unless the authorization is terminated or revoked sooner.     Influenza A by PCR 08/28/2021 NEGATIVE  NEGATIVE Final   Influenza B by PCR 08/28/2021 NEGATIVE  NEGATIVE Final   Comment: (NOTE) The Xpert Xpress SARS-CoV-2/FLU/RSV plus assay is  intended as an aid in the diagnosis of influenza from Nasopharyngeal swab specimens and should not be used as a sole basis for treatment. Nasal washings and aspirates are unacceptable for Xpert Xpress SARS-CoV-2/FLU/RSV testing.  Fact Sheet for Patients: EntrepreneurPulse.com.au  Fact Sheet for Healthcare Providers: IncredibleEmployment.be  This test is not yet approved or cleared by the [place] FDA and has been authorized for detection and/or diagnosis of SARS-CoV-2 by FDA under an Emergency Use Authorization (EUA). This EUA will remain in effect (meaning this test can be used) for the duration of the COVID-19 declaration under Section 564(b)(1) of the Act, 21 U.S.C. section 360bbb-3(b)(1), unless the authorization is terminated or revoked.  Performed at [facility], [place] [address] [place], [place] [address]    TSH 08/28/2021 0.676  0.350 - 4.500 uIU/mL Final   Comment: Performed by a 3rd Generation assay with a functional sensitivity of <=0.01 uIU/mL. Performed at [facility], [place] [address], [place], [place] [address]   Admission on 04/03/2021, Discharged on 04/08/2021  Component Date Value Ref Range Status   Hgb A1c MFr Bld 04/04/2021 5.7 (H)  4.8 - 5.6 % Final   Comment: (NOTE)         Prediabetes: 5.7 - 6.4         Diabetes: >6.4         Glycemic control for adults with diabetes: <7.0    Mean Plasma Glucose 04/04/2021 117  mg/dL Final   Comment: (NOTE) Performed At: [facility] [place], [place] [id number] Rush Farmer MD [id number]    Cholesterol 04/03/2021 119  0 - 200 mg/dL Final   Triglycerides 04/03/2021 42  <150 mg/dL Final   HDL 04/03/2021 59  >40 mg/dL Final   Total CHOL/HDL Ratio 04/03/2021 2.0  RATIO Final   VLDL 04/03/2021 8  0 - 40 mg/dL Final   LDL Cholesterol 04/03/2021 52  0 - 99 mg/dL Final   Comment:        Total Cholesterol/HDL:CHD  Risk Coronary Heart Disease Risk Table                     Men   Women  1/2 Average Risk   3.4   3.3  Average Risk       5.0   4.4  2 X Average Risk   9.6   7.1  3 X Average Risk  23.4   11.0        Use the calculated Patient Ratio above and the CHD Risk Table to determine the patient's CHD Risk.        ATP III CLASSIFICATION (LDL):  <100     mg/dL   Optimal  100-129  mg/dL   Near or Above                    Optimal  130-159  mg/dL   Borderline  160-189  mg/dL   High  >190     mg/dL   Very High Performed at [place] [address], [place], [place] [address]    TSH 04/03/2021 0.275 (L)  0.350 - 4.500 uIU/mL Final   Comment: Performed by a 3rd Generation assay with a functional sensitivity of <=0.01 uIU/mL. Performed at [facility], [place] [address] [place], [place] [address]    Color, Urine 04/03/2021 STRAW (A)  YELLOW Final   APPearance 04/03/2021 CLEAR  CLEAR Final   Specific Gravity, Urine 04/03/2021 1.010  1.005 - 1.030 Final   pH 04/03/2021 6.0  5.0 - 8.0 Final   Glucose, UA 04/03/2021 NEGATIVE  NEGATIVE mg/dL Final   Hgb urine dipstick 04/03/2021 NEGATIVE  NEGATIVE Final   Bilirubin Urine 04/03/2021 NEGATIVE  NEGATIVE Final   Ketones, ur 04/03/2021 NEGATIVE  NEGATIVE mg/dL Final   Protein, ur 04/03/2021 NEGATIVE  NEGATIVE mg/dL Final   Nitrite 04/03/2021 NEGATIVE  NEGATIVE Final   Leukocytes,Ua 04/03/2021 TRACE (A)  NEGATIVE Final   WBC, UA 04/03/2021 0-5  0 - 5 WBC/hpf Final   Bacteria, UA 04/03/2021 RARE (A)  NONE SEEN Final   Squamous Epithelial / LPF 04/03/2021 0-5  0 - 5 Final   Performed at [facility], [place] [facility] [place] [address], [place], [place] [address]   Preg Test, Ur 04/03/2021 NEGATIVE  NEGATIVE Final   Comment:        THE SENSITIVITY OF THIS METHODOLOGY IS >20 mIU/mL. Performed at [facility], [place] [address], [place], [place] [address]    WBC 04/04/2021 11.0 (H)  4.0 - 10.5 K/uL  Final   RBC 04/04/2021 4.95  3.87 - 5.11 MIL/uL Final   Hemoglobin 04/04/2021 13.9  12.0 - 15.0 g/dL Final   HCT 04/04/2021 44.4  36.0 - 46.0 % Final   MCV 04/04/2021 89.7  80.0 - 100.0 fL Final   MCH 04/04/2021 28.1  26.0 - 34.0 pg Final   MCHC 04/04/2021 31.3  30.0 - 36.0 g/dL Final   RDW 04/04/2021 15.0  11.5 - 15.5 % Final   Platelets 04/04/2021 273  150 - 400 K/uL Final   nRBC 04/04/2021 0.0  0.0 - 0.2 % Final   Neutrophils Relative % 04/04/2021 52  % Final   Neutro Abs 04/04/2021 5.8  1.7 - 7.7 K/uL Final   Lymphocytes Relative 04/04/2021 40  % Final   Lymphs Abs 04/04/2021 4.4 (H)  0.7 - 4.0 K/uL Final   Monocytes Relative 04/04/2021 6  % Final   Monocytes Absolute 04/04/2021 0.6  0.1 - 1.0 K/uL Final   Eosinophils Relative 04/04/2021 1  % Final   Eosinophils Absolute 04/04/2021 0.1  0.0 - 0.5 K/uL Final   Basophils Relative 04/04/2021 1  % Final   Basophils Absolute 04/04/2021 0.1  0.0 - 0.1 K/uL Final   Immature Granulocytes 04/04/2021 0  % Final   Abs Immature Granulocytes 04/04/2021 0.03  0.00 -  0.07 K/uL Final   Performed at [facility], [place] [address], [place], [place] [address]   Sodium 04/04/2021 137  135 - 145 mmol/L Final   Potassium 04/04/2021 3.7  3.5 - 5.1 mmol/L Final   Chloride 04/04/2021 103  98 - 111 mmol/L Final   CO2 04/04/2021 28  22 - 32 mmol/L Final   Glucose, Bld 04/04/2021 90  70 - 99 mg/dL Final   Glucose reference range applies only to samples taken after fasting for at least 8 hours.   BUN 04/04/2021 8  6 - 20 mg/dL Final   Creatinine, Ser 04/04/2021 0.71  0.44 - 1.00 mg/dL Final   Calcium 04/04/2021 9.3  8.9 - 10.3 mg/dL Final   Total Protein 04/04/2021 7.6  6.5 - 8.1 g/dL Final   Albumin 04/04/2021 4.1  3.5 - 5.0 g/dL Final   AST 04/04/2021 25  15 - 41 U/L Final   ALT 04/04/2021 22  0 - 44 U/L Final   Alkaline Phosphatase 04/04/2021 79  38 - 126 U/L Final   Total Bilirubin 04/04/2021 0.6  0.3 - 1.2 mg/dL Final   GFR,  Estimated 04/04/2021 >60  >60 mL/min Final   Comment: (NOTE) Calculated using the CKD-EPI Creatinine Equation (2021)    Anion gap 04/04/2021 6  5 - 15 Final   Performed at [facility], [place] [address] [place], [place] [address]   Specimen Description 04/06/2021    Final                   Value:URINE, CLEAN CATCH Performed at [facility], [place] [address], [place], [place] [address]    Special Requests 04/06/2021    Final                   Value:Normal Performed at [facility], [place] [address], [place],  [address]    Culture 04/06/2021 MULTIPLE SPECIES PRESENT, SUGGEST RECOLLECTION (A)   Final   Report Status 04/06/2021 04/08/2021 FINAL   Final   WBC 04/05/2021 10.1  4.0 - 10.5 K/uL Final   RBC 04/05/2021 4.98  3.87 - 5.11 MIL/uL Final   Hemoglobin 04/05/2021 14.0  12.0 - 15.0 g/dL Final   HCT 04/05/2021 44.5  36.0 - 46.0 % Final   MCV 04/05/2021 89.4  80.0 - 100.0 fL Final   MCH 04/05/2021 28.1  26.0 - 34.0 pg Final   MCHC 04/05/2021 31.5  30.0 - 36.0 g/dL Final   RDW 04/05/2021 15.3  11.5 - 15.5 % Final   Platelets 04/05/2021 247  150 - 400 K/uL Final   nRBC 04/05/2021 0.0  0.0 - 0.2 % Final   Neutrophils Relative % 04/05/2021 48  % Final   Neutro Abs 04/05/2021 4.9  1.7 - 7.7 K/uL Final   Lymphocytes Relative 04/05/2021 44  % Final   Lymphs Abs 04/05/2021 4.4 (H)  0.7 - 4.0 K/uL Final   Monocytes Relative 04/05/2021 6  % Final   Monocytes Absolute 04/05/2021 0.6  0.1 - 1.0 K/uL Final   Eosinophils Relative 04/05/2021 1  % Final   Eosinophils Absolute 04/05/2021 0.1  0.0 - 0.5 K/uL Final   Basophils Relative 04/05/2021 1  % Final   Basophils Absolute 04/05/2021 0.1  0.0 - 0.1 K/uL Final   Immature Granulocytes 04/05/2021 0  % Final   Abs Immature Granulocytes 04/05/2021 0.02  0.00 - 0.07 K/uL Final   Performed at [facility], [address] [place], [place] [address]   TSH 04/05/2021 0.586   0.350 - 4.500 uIU/mL Final   Comment: Performed by a 3rd Generation assay with a functional sensitivity of <=0.01 uIU/mL. Performed at [facility], [address], [place], [place] [address]    Free T4 04/05/2021 1.07  0.61 - 1.12 ng/dL Final   Comment: (NOTE) Biotin ingestion may interfere with free T4 tests. If the results are inconsistent with the TSH level, previous test results, or the clinical presentation, then consider biotin interference. If needed, order repeat testing after stopping biotin. Performed at [facility], [address] [place], [place] [address]    T3, Free 04/05/2021 2.5  2.0 - 4.4 pg/mL Final   Comment: (NOTE) Performed At: [facility] [address] [place], [place] [id number] Jolene Schimke MD OZ:[id number]   Admission on 04/01/2021, Discharged on 04/03/2021  Component Date Value Ref Range Status   SARS Coronavirus 2 by RT PCR 04/02/2021 NEGATIVE  NEGATIVE Final   Comment: (NOTE) SARS-CoV-2 target nucleic acids are NOT DETECTED.  The SARS-CoV-2 RNA is generally detectable in upper respiratory specimens during the acute phase of infection. The lowest concentration of SARS-CoV-2 viral copies this assay can detect is 138 copies/mL. A negative result does not preclude SARS-Cov-2 infection and should not be used as the sole basis for treatment or other patient management decisions. A negative result may occur with  improper specimen collection/handling, submission of specimen other than nasopharyngeal swab, presence of viral mutation(s) within the areas targeted by this assay, and inadequate number of viral copies(<138 copies/mL). A negative result must be combined with clinical observations, patient history, and epidemiological information. The expected result is Negative.  Fact Sheet for Patients:  BloggerCourse.com  Fact Sheet for Healthcare Providers:   SeriousBroker.it  This test is no                          t yet approved or cleared by the [place] FDA and  has been authorized for detection and/or diagnosis of SARS-CoV-2 by FDA under an Emergency Use Authorization (EUA). This EUA will remain  in effect (meaning this test can be used) for the duration of the COVID-19 declaration under Section 564(b)(1) of the Act, 21 U.S.C.section 360bbb-3(b)(1), unless the authorization is terminated  or revoked sooner.       Influenza A by PCR 04/02/2021 NEGATIVE  NEGATIVE Final   Influenza B by PCR 04/02/2021 NEGATIVE  NEGATIVE Final   Comment: (NOTE) The Xpert Xpress SARS-CoV-2/FLU/RSV plus assay is intended as an aid in the diagnosis of influenza from Nasopharyngeal swab specimens and should not be used as a sole basis for treatment. Nasal washings and aspirates are unacceptable for Xpert Xpress SARS-CoV-2/FLU/RSV testing.  Fact Sheet for Patients: BloggerCourse.com  Fact Sheet for Healthcare Providers: SeriousBroker.it  This test is not yet approved or cleared by the [place] FDA and has been authorized for detection and/or diagnosis of SARS-CoV-2 by FDA under an Emergency Use Authorization (EUA). This EUA will remain in effect (meaning this test can be used) for the duration of the COVID-19 declaration under Section 564(b)(1) of the Act, 21 U.S.C. section 360bbb-3(b)(1), unless the authorization is terminated or revoked.  Performed at [facility], [address] [place], [place] [address]   Admission on 03/23/2021, Discharged on 03/23/2021  Component Date Value Ref Range Status   Lipase 03/23/2021 22  11 - 51 U/L Final  Performed at [facility], [address], [place], [place] [address]   Sodium 03/23/2021 139  135 - 145 mmol/L Final   Potassium 03/23/2021 3.5  3.5 - 5.1 mmol/L Final   Chloride  03/23/2021 104  98 - 111 mmol/L Final   CO2 03/23/2021 28  22 - 32 mmol/L Final   Glucose, Bld 03/23/2021 97  70 - 99 mg/dL Final   Glucose reference range applies only to samples taken after fasting for at least 8 hours.   BUN 03/23/2021 11  6 - 20 mg/dL Final   Creatinine, Ser 03/23/2021 0.63  0.44 - 1.00 mg/dL Final   Calcium 60/45/409807/06/2021 9.3  8.9 - 10.3 mg/dL Final   Total Protein 11/91/478207/06/2021 7.2  6.5 - 8.1 g/dL Final   Albumin 95/62/130807/06/2021 4.2  3.5 - 5.0 g/dL Final   AST 65/78/469607/06/2021 18  15 - 41 U/L Final   ALT 03/23/2021 14  0 - 44 U/L Final   Alkaline Phosphatase 03/23/2021 73  38 - 126 U/L Final   Total Bilirubin 03/23/2021 0.6  0.3 - 1.2 mg/dL Final   GFR, Estimated 03/23/2021 >60  >60 mL/min Final   Comment: (NOTE) Calculated using the CKD-EPI Creatinine Equation (2021)    Anion gap 03/23/2021 7  5 - 15 Final   Performed at [facility], [address], [place], [place] [address]   WBC 03/23/2021 8.6  4.0 - 10.5 K/uL Final   RBC 03/23/2021 4.61  3.87 - 5.11 MIL/uL Final   Hemoglobin 03/23/2021 12.7  12.0 - 15.0 g/dL Final   HCT 41/32/440107/06/2021 40.2  36.0 - 46.0 % Final   MCV 03/23/2021 87.2  80.0 - 100.0 fL Final   MCH 03/23/2021 27.5  26.0 - 34.0 pg Final   MCHC 03/23/2021 31.6  30.0 - 36.0 g/dL Final   RDW 02/72/536607/06/2021 15.2  11.5 - 15.5 % Final   Platelets 03/23/2021 232  150 - 400 K/uL Final   nRBC 03/23/2021 0.0  0.0 - 0.2 % Final   Performed at [facility], [address], [place], [place] [address]   Color, Urine 03/23/2021 YELLOW  YELLOW Final   APPearance 03/23/2021 CLEAR  CLEAR Final   Specific Gravity, Urine 03/23/2021 1.034 (H)  1.005 - 1.030 Final   pH 03/23/2021 6.0  5.0 - 8.0 Final   Glucose, UA 03/23/2021 NEGATIVE  NEGATIVE mg/dL Final   Hgb urine dipstick 03/23/2021 LARGE (A)  NEGATIVE Final   Bilirubin Urine 03/23/2021 NEGATIVE  NEGATIVE Final   Ketones, ur 03/23/2021 NEGATIVE  NEGATIVE mg/dL Final   Protein, ur 74/25/956307/06/2021  TRACE (A)  NEGATIVE mg/dL Final   Nitrite 87/56/433207/06/2021 NEGATIVE  NEGATIVE Final   Leukocytes,Ua 03/23/2021 TRACE (A)  NEGATIVE Final   RBC / HPF 03/23/2021 11-20  0 - 5 RBC/hpf Final   WBC, UA 03/23/2021 6-10  0 - 5 WBC/hpf Final   Bacteria, UA 03/23/2021 RARE (A)  NONE SEEN Final   Squamous Epithelial / LPF 03/23/2021 0-5  0 - 5 Final   Mucus 03/23/2021 PRESENT   Final   Performed at [facility], [address], [place], [place] [address]   Preg Test, Ur 03/23/2021 NEGATIVE  NEGATIVE Final   Comment:        THE SENSITIVITY OF THIS METHODOLOGY IS >20 mIU/mL. Performed at [facility], [address], [place], [place] [address]     Allergies: Patient has no known allergies.  PTA Medications: (Not in a hospital admission)  Medical Decision Making  Patient is a 20 year old female with past psychiatric and medical history as stated above who presents to the [facility] voluntarily accompanied by her mother and father for worsening depression and SI.  Based on patient's current presentation, including SI yesterday on 08/31/2021 with thoughts of wanting to drive her car off of the road, as well as patient's psychiatric symptoms, the patient appears to be experiencing severe worsening of her depressive symptoms that is significantly negatively impacting her ability to conduct her activities of daily living and the patient appears to be a potential danger to herself at this time. Thus, recommend continuous assessment for the patient with 09/02/2021 psychiatry reevaluation at this time.  Patient may be appropriate for [facility] facility based crisis [facility]) admission upon 09/02/2021 psychiatry reevaluation.  If patient is determined to be safe for discharge home upon 09/02/2021 psychiatry reevaluation, patient may also be appropriate/a good candidate for PHP/IOP. Patient reports that she is open to discussing these various treatment options, including FBC and PHP/IOP with the  treatment team upon 09/02/2021 psychiatry reevaluation.    Recommendations  Based on my evaluation the patient does not appear to have an emergency medical condition.  Patient will be admitted to [facility] continuous assessment for further crisis stabilization and treatment.  Patient will be reevaluated by the treatment team on 09/02/2021 and disposition to be determined at that time. Patient may be appropriate for [facility] facility based crisis [facility] admission upon 09/02/2021 psychiatry reevaluation.  If patient is determined to be safe for discharge home upon 09/02/2021 psychiatry reevaluation, patient may also be appropriate/a good candidate for PHP/IOP. Patient reports that she is open to discussing these various treatment options, including FBC and PHP/IOP with the treatment team upon 09/02/2021 psychiatry reevaluation.  Of note, per chart review, patient had a positive PCR COVID test on 08/28/2021 at [facility] ED.  Additionally, per chart review of outside/out-of-network records, it is documented that patient had a positive PCR COVID test on 08/19/2021 through [facility].  Additionally, chart review also shows that patient had a negative rapid COVID test result on 09/01/2021 from [facility].  Patient reports that she went back to work on 08/28/2021 after quarantine due to her tested positive for COVID on 08/19/2021.  She endorses having a slight intermittent nonproductive cough, but she denies having any additional respiratory symptoms or any additional physical symptoms at this time.  She reports that her respiratory symptoms have improved since she tested positive for COVID on 08/19/2021 and she states that her respiratory symptoms are continuing to improve.  Patient afebrile on exam with temperature 98.4 F.  Vital signs stable: Blood pressure 111/72, respirations 18, SPO2 100% on room air, pulse slightly bradycardic at 59 bpm but unremarkable (patient's respirations even and  unlabored with normal respiratory effort and patient not showing any signs of acute distress at this time).  Patient is not ill-appearing, toxic appearing, or diaphoretic at this time.  Based on patient's past history of COVID test trend (including patient testing positive for COVID via PCR COVID test within the past 90 days), patient's symptom profile noted above, and per CDC guidelines regarding COVID isolation/quarantine for testing positive with symptoms (which date to isolate for 5 days if you test positive and that you may end isolation after day 5 if symptoms are improving and you are fever free within 24 hours), patient should no longer considered to be within the infectious/contagious window at this time, there is no necessity for patient  to continue isolation at this time and thus, patient is appropriate for admission to the [facility] continuous assessment unit at this time, despite the result of patient's PCR COVID test.  For additional precautions, patient will be admitted to the flex continuous assessment unit where there are less patients at this time compared to the other adult continuous assessment unit.  Additionally, patient has agreed to keep her mask on at this time while admitted to the continuous assessment.  Labs/tests ordered and reviewed:  -POC SARS COVID 2 Ag: Negative  -PCR Flu A&B, COVID: Results pending  -UDS: Negative for all substances  -Urine pregnancy: Negative  -CBC with differential, CMP, hemoglobin A1c, lipid panel, TSH: Nursing staff unable to obtain blood work on the patient at this time despite multiple attempts.  Recommend that nursing attempt to obtain blood work on the patient during the day on 09/02/2021 if possible.  Patient had EKG done on 08/28/2021 in the [facility] ED when patient had work-up for vasovagal syncope done at that time, and his EKG showed sinus rhythm with no acute/concerning findings and QT/QTC of 373/403 ms.  Patient's QT/QTC appropriate for  continuing antipsychotic medications at this time.  Will continue the following home medications at this time: -Prozac 20 mg p.o. daily for MDD, anxiety -Abilify 10 mg p.o. daily for MDD, mood stability -Synthroid 100 mcg p.o. every morning for hypothyroidism    Jaclyn Shaggy, PA-C 09/02/21  4:20 AM

## 2021-09-02 NOTE — ED Notes (Signed)
Patient was given lunch. 

## 2021-09-02 NOTE — ED Provider Notes (Signed)
FBC/OBS ASAP Discharge Summary  Date and Time: 09/02/2021 9:47 PM  Name: Joyce [name]  MRN:  [id number]   Discharge Diagnoses:  Final diagnoses:  MDD (major depressive disorder), recurrent severe, without psychosis ([place])  Generalized anxiety disorder  Suicidal ideation    Subjective: Joyce [name], 20 y.o., female patient seen face to face by this provider, chart reviewed and discussed Dr. Serafina Mitchell on 09/02/21.  Patient initially presented to [facility] for worsening depression and passive SI.  She was admitted to the continuous assessment with a  psychiatric reevaluations in the am.  On today's reevaluation patient is alert/oriented x4.  She is calm and cooperative.  She makes fair eye contact.  Her speech is at a moderate tone, moderated pace, with a decreased volume.  Reports she slept well last night.  Reports she did not take her home medications that were restarted last night.  States, "I did not feel like I wanted to".  Reports she feels a little better this morning.  She still feels overwhelmed when she thinks about her current situation. Endorses depression with a congruent affect. Patient is withdrawn. Reports she follows outpatient psychiatric provider Joyce Chris NP.  She has a appointment with [facility] in January but she is unsure if this is medication management or therapy.  Reports she would like help increasing her motivation and overall functioning.  Reports she has good relationship with her family and relies on them often.  She denies auditory and visual hallucinations.  She does not appear to be responding to internal/external stimuli.  She denies suicidal/homicidal ideations.  Denies having any intent, plan, or access to means.  She contracts for safety with this Probation officer.  Provided encouragement and support with patient.  Discussed coping skills.  Discussed suicide intervention/prevention education with patient and her father.    Patient provided permission for this writer to  speak with her father.  Joyce [name].  Joyce [name] reports he is supportive to his daughter and he does not have any immediate safety concerns with her returning home.  Reports he just wants her to feel better.  Reports he battles with his own depression.  States he will monitor patient and make sure she is doing okay.  Referral was made for [facility] program.  States he will help hold patient accountable to participate in program.     Stay Summary: Patient continued to remain calm and cooperative on the unit.  She agreed to participate in outpatient PHP program with [facility]  behavioral health partial hospitalization.  She has a follow-up appointment at [facility] psychiatric on 09/21/2020.  Patient is requesting to be discharged.  She is contracting for safety with this Probation officer.  She denies suicidal/homicidal ideation. Safety planning with patient and her father has been completed.   Total Time spent with patient: 30 minutes  Past Psychiatric History: See H&P Past Medical History:  Past Medical History:  Diagnosis Date   Constipation    Hypopituitarism ([place])    Left foot drop    Thyroid disease    History reviewed. No pertinent surgical history. Family History:  Family History  Problem Relation Age of Onset   Anxiety disorder Mother    Depression Mother    Hypertension Mother    Depression Father    Healthy Father    Drug abuse Brother    Hypertension Paternal Grandmother    Diabetes Paternal Grandmother    Family Psychiatric History: See H&P Social History:  Social History   Substance and Sexual Activity  Alcohol Use Not Currently     Social History   Substance and Sexual Activity  Drug Use Never    Social History   Socioeconomic History   Marital status: Single    Spouse name: Not on file   Number of children: 0   Years of education: college student now   Highest education level: Not on file  Occupational History   Occupation: student    Comment: Works at [employer]  Use   Smoking status: Never   Smokeless tobacco: Never  Vaping Use   Vaping Use: Never used  Substance and Sexual Activity   Alcohol use: Not Currently   Drug use: Never   Sexual activity: Never  Other Topics Concern   Not on file  Social History Narrative   Lives at home with parents.   Right-handed.   No daily caffeine use.   Loves to draw, and write stories.    Social Determinants of Health   Financial Resource Strain: Not on file  Food Insecurity: Not on file  Transportation Needs: Not on file  Physical Activity: Not on file  Stress: Not on file  Social Connections: Not on file   SDOH:  SDOH Screenings   Alcohol Screen: Low Risk    Last Alcohol Screening Score (AUDIT): 0  Depression (PHQ2-9): Medium Risk   PHQ-2 Score: 14  Financial Resource Strain: Not on file  Food Insecurity: Not on file  Housing: Not on file  Physical Activity: Not on file  Social Connections: Not on file  Stress: Not on file  Tobacco Use: Low Risk    Smoking Tobacco Use: Never   Smokeless Tobacco Use: Never   Passive Exposure: Not on file  Transportation Needs: Not on file    Tobacco Cessation:  N/A, patient does not currently use tobacco products  Current Medications:  No current facility-administered medications for this encounter.   Current Outpatient Medications  Medication Sig Dispense Refill   ARIPiprazole (ABILIFY) 10 MG tablet Take 1 tablet (10 mg total) by mouth daily. (Patient taking differently: Take 10 mg by mouth at bedtime.) 30 tablet 0   FLUoxetine (PROZAC) 20 MG capsule Take 1 capsule (20 mg total) by mouth daily. 30 capsule 0   levothyroxine (SYNTHROID) 100 MCG tablet Take 100 mcg by mouth every morning.      PTA Medications: (Not in a hospital admission)   Musculoskeletal  Strength & Muscle Tone: within normal limits Gait & Station: normal Patient leans: N/A  Psychiatric Specialty Exam  Presentation  General Appearance: Appropriate for Environment;  Casual  Eye Contact:Good  Speech:Clear and Coherent; Normal Rate  Speech Volume:Decreased  Handedness:Right   Mood and Affect  Mood:Depressed  Affect:Congruent; Flat   Thought Process  Thought Processes:Coherent  Descriptions of Associations:Intact  Orientation:Full (Time, Place and Person)  Thought Content:Logical  Diagnosis of Schizophrenia or Schizoaffective disorder in past: No    Hallucinations:Hallucinations: None  Ideas of Reference:None  Suicidal Thoughts:Suicidal Thoughts: No SI Passive Intent and/or Plan: Without Intent; Without Plan; Without Means to Carry Out; With Access to Means  Homicidal Thoughts:Homicidal Thoughts: No   Sensorium  Memory:Immediate Good; Recent Good; Remote Good  Judgment:Good  Insight:Good   Executive Functions  Concentration:Good  Attention Span:Good  Elizabeth of Knowledge:Good  Language:Good   Psychomotor Activity  Psychomotor Activity:Psychomotor Activity: Normal   Assets  Assets:Communication Skills; Desire for Improvement; Financial Resources/Insurance; Housing; Leisure Time; Physical Health; Resilience   Sleep  Sleep:Sleep: Fair Number of Hours of  Sleep: 7   Nutritional Assessment (For OBS and FBC admissions only) Has the patient had a weight loss or gain of 10 pounds or more in the last 3 months?: No Has the patient had a decrease in food intake/or appetite?: No Does the patient have dental problems?: No Does the patient have eating habits or behaviors that may be indicators of an eating disorder including binging or inducing vomiting?: No Has the patient recently lost weight without trying?: 0 Has the patient been eating poorly because of a decreased appetite?: 0 Malnutrition Screening Tool Score: 0    Physical Exam  Physical Exam Vitals and nursing note reviewed.  Constitutional:      General: She is not in acute distress.    Appearance: Normal appearance. She is not  ill-appearing.  HENT:     Head: Normocephalic and atraumatic.  Eyes:     General:        Right eye: No discharge.        Left eye: No discharge.     Conjunctiva/sclera: Conjunctivae normal.  Cardiovascular:     Rate and Rhythm: Normal rate.  Pulmonary:     Effort: Pulmonary effort is normal. No respiratory distress.  Musculoskeletal:        General: Normal range of motion.     Cervical back: Normal range of motion.  Skin:    General: Skin is warm and dry.  Neurological:     Mental Status: She is alert and oriented to person, place, and time.  Psychiatric:        Attention and Perception: Attention and perception normal.        Mood and Affect: Mood is depressed.        Speech: Speech normal.        Behavior: Behavior is withdrawn. Behavior is cooperative.        Thought Content: Thought content normal.        Cognition and Memory: Cognition normal.        Judgment: Judgment normal.   Review of Systems  Constitutional: Negative.   HENT: Negative.    Eyes: Negative.   Respiratory: Negative.    Cardiovascular: Negative.   Musculoskeletal: Negative.   Skin: Negative.   Neurological: Negative.   Psychiatric/Behavioral:  Positive for depression.   Blood pressure 101/72, pulse (!) 59, temperature 98.4 F (36.9 C), temperature source Oral, resp. rate 18, last menstrual period 08/19/2021, SpO2 100 %. There is no height or weight on file to calculate BMI.  Demographic Factors:  Adolescent or young adult and Caucasian  Loss Factors: NA  Historical Factors: Family history of mental illness or substance abuse  Risk Reduction Factors:   Sense of responsibility to family, Religious beliefs about death, Employed, Living with another person, especially a relative, Positive social support, Positive therapeutic relationship, and Positive coping skills or problem solving skills  Continued Clinical Symptoms:  Severe Anxiety and/or Agitation Depression:   Hopelessness  Cognitive  Features That Contribute To Risk:  None    Suicide Risk:  Minimal: No identifiable suicidal ideation.  Patients presenting with no risk factors but with morbid ruminations; may be classified as minimal risk based on the severity of the depressive symptoms  Plan Of Care/Follow-up recommendations:  Activity:  as tolerated  Diet:  reagular   Disposition: Discharge patient.  Referral made to Dellia Nims at behavioral health partial hospitalization program.  Patient will follow-up with her current outpatient psychiatric provider, [facility].  Reports she has an appointment on  09/21/2020.  No evidence of imminent risk to self or others at present.    Patient does not meet criteria for psychiatric inpatient admission. Discussed crisis plan, support from social network, calling 911, coming to the Emergency Department, and calling Suicide Hotline.   Ardis Hughs, NP 09/02/2021, 9:47 PM

## 2021-09-02 NOTE — Progress Notes (Signed)
The patient denies SI, HI and AVH.  Pain 0/10. Patient does not have any objective signs of acute distress or responding to internal stimulus. Patient received scheduled medications. Patient informed RN that she would rather go home then to go to [facility]. RN Informed provider via secure chat.

## 2021-09-04 ENCOUNTER — Ambulatory Visit (INDEPENDENT_AMBULATORY_CARE_PROVIDER_SITE_OTHER): Payer: 59 | Admitting: Behavioral Health

## 2021-09-04 ENCOUNTER — Encounter: Payer: Self-pay | Admitting: Behavioral Health

## 2021-09-04 ENCOUNTER — Other Ambulatory Visit: Payer: Self-pay

## 2021-09-04 DIAGNOSIS — F411 Generalized anxiety disorder: Secondary | ICD-10-CM

## 2021-09-04 DIAGNOSIS — F332 Major depressive disorder, recurrent severe without psychotic features: Secondary | ICD-10-CM

## 2021-09-04 MED ORDER — FLUOXETINE HCL 20 MG PO CAPS: 20.0000 mg | ORAL_CAPSULE | Freq: Every day | ORAL | 2 refills | Status: DC

## 2021-09-04 MED ORDER — ARIPIPRAZOLE 10 MG PO TABS
10.0000 mg | ORAL_TABLET | Freq: Every day | ORAL | 2 refills | Status: AC
Start: 2021-09-04 — End: ?

## 2021-09-04 NOTE — Progress Notes (Signed)
[facility] Med Check  Patient ID: Joyce [name],  MRN: [id number]  PCP: Merri Brunette, MD  Date of Evaluation: 09/04/2021 Time spent:20 minutes  Chief Complaint:  Chief Complaint   Anxiety; Depression; Follow-up; Medication Refill; Medication Problem     HISTORY/CURRENT STATUS: HPI  20 year old african [place] female presents to this office for follow up post ER visit for severe depressive episode and suicidal ideation.  Pt is very soft spoken and guarded. She is withdrawn and speaks with very low volume. She admits to being non compliant with medication and falling back in to depressive state when she stopped. She agrees that she does very well when she is taking her medication consistently. She would not agree to dosage adjustment or increase of Prozac She wants to leave the dosage the same but verbally commits to taking medication as prescribed.  She says that she is not currently suicidal and contracts for safety. She says that SI has subsided and she would not follow through because of the love for family and her faith. She wants to continue with psychotherapy. She is seeing Zoila Shutter. Denies Mania, No psychosis, No  current SI/HI.   No previous psychiatric medication failures.   Individual Medical History/ Review of Systems: Changes? :No   Allergies: Patient has no known allergies.  Current Medications:  Current Outpatient Medications:    ARIPiprazole (ABILIFY) 10 MG tablet, Take 1 tablet (10 mg total) by mouth daily., Disp: 30 tablet, Rfl: 2   FLUoxetine (PROZAC) 20 MG capsule, Take 1 capsule (20 mg total) by mouth daily., Disp: 30 capsule, Rfl: 2   levothyroxine (SYNTHROID) 100 MCG tablet, Take 100 mcg by mouth every morning., Disp: , Rfl:  Medication Side Effects: none  Family Medical/ Social History: Changes? No  MENTAL HEALTH EXAM:  Last menstrual period 08/19/2021.There is no height or weight on file to calculate BMI.  General Appearance: Casual, Neat, and  Well Groomed  Eye Contact:  Fair  Speech:  Clear and Coherent, Pressured, and Slow  Volume:  Decreased  Mood:  Depressed  Affect:  Depressed and Flat  Thought Process:  Coherent  Orientation:  Full (Time, Place, and Person)  Thought Content: Logical   Suicidal Thoughts:  No  Homicidal Thoughts:  No  Memory:  WNL  Judgement:  Fair  Insight:  Fair  Psychomotor Activity:  Normal  Concentration:  Concentration: Fair  Recall:  Good  Fund of Knowledge: Fair  Language: Good  Assets:  Desire for Improvement Resilience Social Support  ADL's:  Intact  Cognition: WNL  Prognosis:  Good    DIAGNOSES:    ICD-10-CM   1. MDD (major depressive disorder), recurrent severe, without psychosis ([facility])  F33.2 ARIPiprazole (ABILIFY) 10 MG tablet    FLUoxetine (PROZAC) 20 MG capsule    2. Generalized anxiety disorder  F41.1 ARIPiprazole (ABILIFY) 10 MG tablet      Receiving Psychotherapy: Yes    RECOMMENDATIONS:  No changes recommended to medication regimen post hospitalization at this time.  Will follow up in 2 months to reassess  Will reinitiate Abilify 5 mg for 4-5 days, then 10 mg tablet daily. Patient has been mostly non-compliant with medication and says that she stopped when she started feeling better. Not sure but she may have been off medication for over 1 month.  Continue Prozac  20 mg tab daily Will report any worsening symptoms or side effects promptly Provided emergency contact information  Greater than 50% of  20 min face to face  time with patient was spent on counseling and coordination of care. We discussed the importance of taking medication exactly as prescribed. She agreed that her severe depression returned when she stopped taking medication correctly. Father says it is like night and day when she is taking medication. There has been no incidents of psychosis since last visit.  We discussed long term personal goals that would assist in bringing emotional stability over time.  Discussed her continued complicated family dynamics and religious conflict between her parents. She quit one of her jobs but says she will find another when she starts to feel better. Reviewed labs, patient is borderline pre DX Reviewed PDMP    Elwanda Brooklyn, NP

## 2021-09-12 ENCOUNTER — Telehealth (HOSPITAL_COMMUNITY): Payer: Self-pay | Admitting: Family Medicine

## 2021-09-12 NOTE — BH Assessment (Signed)
Care Management - [facility]UC Follow Up Discharges   Writer attempted to make contact with patient today and was unsuccessful.  Writer left a HIPPA compliant voice message.   Per chart review, patient follows up his established provider Avelina Laine NP.  She has a appointment with [facility] in September 21, 2021.

## 2021-09-22 ENCOUNTER — Ambulatory Visit: Payer: 59 | Admitting: Addiction (Substance Use Disorder)

## 2021-09-25 ENCOUNTER — Ambulatory Visit: Payer: 59 | Admitting: Behavioral Health

## 2021-10-13 ENCOUNTER — Ambulatory Visit: Payer: 59 | Admitting: Addiction (Substance Use Disorder)

## 2021-10-21 ENCOUNTER — Ambulatory Visit: Payer: 59 | Admitting: Addiction (Substance Use Disorder)

## 2021-11-11 ENCOUNTER — Ambulatory Visit: Payer: 59 | Admitting: Addiction (Substance Use Disorder)

## 2021-11-25 ENCOUNTER — Ambulatory Visit: Payer: 59 | Admitting: Addiction (Substance Use Disorder)

## 2021-12-19 ENCOUNTER — Other Ambulatory Visit: Payer: Self-pay | Admitting: Behavioral Health

## 2021-12-19 DIAGNOSIS — F332 Major depressive disorder, recurrent severe without psychotic features: Secondary | ICD-10-CM

## 2021-12-22 NOTE — Telephone Encounter (Signed)
Please call to schedule an appt. Last seen 12/22 with RTC in 3 weeks. Has had multiple no shows for [facility], in addition to a no show and cancellation with Arlys John.  ?

## 2022-01-13 ENCOUNTER — Encounter (HOSPITAL_BASED_OUTPATIENT_CLINIC_OR_DEPARTMENT_OTHER): Payer: Self-pay

## 2022-01-13 ENCOUNTER — Other Ambulatory Visit: Payer: Self-pay

## 2022-01-13 DIAGNOSIS — E039 Hypothyroidism, unspecified: Secondary | ICD-10-CM | POA: Diagnosis not present

## 2022-01-13 DIAGNOSIS — R1033 Periumbilical pain: Secondary | ICD-10-CM | POA: Diagnosis present

## 2022-01-13 DIAGNOSIS — Z79899 Other long term (current) drug therapy: Secondary | ICD-10-CM | POA: Diagnosis not present

## 2022-01-13 DIAGNOSIS — K8012 Calculus of gallbladder with acute and chronic cholecystitis without obstruction: Principal | ICD-10-CM | POA: Insufficient documentation

## 2022-01-13 LAB — CBC
HCT: 42 % (ref 36.0–46.0)
Hemoglobin: 13.3 g/dL (ref 12.0–15.0)
MCH: 26.5 pg (ref 26.0–34.0)
MCHC: 31.7 g/dL (ref 30.0–36.0)
MCV: 83.8 fL (ref 80.0–100.0)
Platelets: 303 10*3/uL (ref 150–400)
RBC: 5.01 MIL/uL (ref 3.87–5.11)
RDW: 14.8 % (ref 11.5–15.5)
WBC: 16.6 10*3/uL — ABNORMAL HIGH (ref 4.0–10.5)
nRBC: 0 % (ref 0.0–0.2)

## 2022-01-13 LAB — URINALYSIS, ROUTINE W REFLEX MICROSCOPIC
Bilirubin Urine: NEGATIVE
Glucose, UA: NEGATIVE mg/dL
Hgb urine dipstick: NEGATIVE
Ketones, ur: >80 mg/dL — AB
Leukocytes,Ua: NEGATIVE
Nitrite: NEGATIVE
Protein, ur: 30 mg/dL — AB
Specific Gravity, Urine: 1.042 — ABNORMAL HIGH (ref 1.005–1.030)
pH: 6 (ref 5.0–8.0)

## 2022-01-13 LAB — COMPREHENSIVE METABOLIC PANEL
ALT: 14 U/L (ref 0–44)
AST: 25 U/L (ref 15–41)
Albumin: 4.4 g/dL (ref 3.5–5.0)
Alkaline Phosphatase: 78 U/L (ref 38–126)
Anion gap: 11 (ref 5–15)
BUN: 9 mg/dL (ref 6–20)
CO2: 23 mmol/L (ref 22–32)
Calcium: 9.7 mg/dL (ref 8.9–10.3)
Chloride: 102 mmol/L (ref 98–111)
Creatinine, Ser: 0.78 mg/dL (ref 0.44–1.00)
GFR, Estimated: >60 mL/min (ref >60)
Glucose, Bld: 80 mg/dL (ref 70–99)
Potassium: 3.5 mmol/L (ref 3.5–5.1)
Sodium: 136 mmol/L (ref 135–145)
Total Bilirubin: 0.7 mg/dL (ref 0.3–1.2)
Total Protein: 8.4 g/dL — ABNORMAL HIGH (ref 6.5–8.1)

## 2022-01-13 LAB — PREGNANCY, URINE: Preg Test, Ur: NEGATIVE

## 2022-01-13 LAB — LIPASE, BLOOD: Lipase: 12 U/L (ref 11–51)

## 2022-01-13 NOTE — ED Triage Notes (Signed)
Patient here POV from Home. ? ?Endorses ABD Pain (Right/Mid) that began Yesterday PM that has continued since it began. Constant in nature and Pressure-Like. ? ?Moderate Nausea/Vomiting/Diarrhea. No Known Fevers. No Urinary Symptoms.  ? ?NAD Noted during Triage. A&Ox4. GCS 15. Ambulatory.   ?

## 2022-01-14 ENCOUNTER — Emergency Department (HOSPITAL_BASED_OUTPATIENT_CLINIC_OR_DEPARTMENT_OTHER): Payer: 59

## 2022-01-14 ENCOUNTER — Encounter (HOSPITAL_COMMUNITY): Payer: Self-pay | Admitting: *Deleted

## 2022-01-14 ENCOUNTER — Emergency Department (HOSPITAL_BASED_OUTPATIENT_CLINIC_OR_DEPARTMENT_OTHER): Payer: 59 | Admitting: Registered Nurse

## 2022-01-14 ENCOUNTER — Observation Stay (HOSPITAL_BASED_OUTPATIENT_CLINIC_OR_DEPARTMENT_OTHER)
Admission: EM | Admit: 2022-01-14 | Discharge: 2022-01-15 | Disposition: A | Discharge status: Home / Self Care | Payer: 59 | Attending: Surgery | Admitting: Surgery

## 2022-01-14 ENCOUNTER — Emergency Department (HOSPITAL_COMMUNITY): Payer: 59 | Admitting: Registered Nurse

## 2022-01-14 ENCOUNTER — Encounter (HOSPITAL_COMMUNITY)
Admission: EM | Disposition: A | Discharge status: Home / Self Care | Payer: Self-pay | Source: Home / Self Care | Attending: Emergency Medicine

## 2022-01-14 ENCOUNTER — Other Ambulatory Visit: Payer: Self-pay

## 2022-01-14 DIAGNOSIS — K810 Acute cholecystitis: Secondary | ICD-10-CM | POA: Diagnosis not present

## 2022-01-14 DIAGNOSIS — E039 Hypothyroidism, unspecified: Secondary | ICD-10-CM | POA: Diagnosis not present

## 2022-01-14 DIAGNOSIS — K8000 Calculus of gallbladder with acute cholecystitis without obstruction: Secondary | ICD-10-CM

## 2022-01-14 DIAGNOSIS — K8012 Calculus of gallbladder with acute and chronic cholecystitis without obstruction: Secondary | ICD-10-CM | POA: Diagnosis not present

## 2022-01-14 DIAGNOSIS — R1033 Periumbilical pain: Secondary | ICD-10-CM | POA: Diagnosis present

## 2022-01-14 DIAGNOSIS — Z79899 Other long term (current) drug therapy: Secondary | ICD-10-CM | POA: Diagnosis not present

## 2022-01-14 HISTORY — PX: CHOLECYSTECTOMY: SHX55

## 2022-01-14 SURGERY — LAPAROSCOPIC CHOLECYSTECTOMY
Anesthesia: General | Site: Abdomen

## 2022-01-14 MED ORDER — SODIUM CHLORIDE 0.9 % IV BOLUS
1000.0000 mL | Freq: Once | INTRAVENOUS | Status: AC
  Administered 2022-01-14: 1000 mL via INTRAVENOUS

## 2022-01-14 MED ORDER — PROPOFOL 10 MG/ML IV BOLUS
INTRAVENOUS | Status: DC | PRN
  Administered 2022-01-14: 160 mg via INTRAVENOUS

## 2022-01-14 MED ORDER — CHLORHEXIDINE GLUCONATE 0.12 % MT SOLN
15.0000 mL | OROMUCOSAL | Status: AC
  Administered 2022-01-14: 15 mL via OROMUCOSAL
  Filled 2022-01-14: qty 15

## 2022-01-14 MED ORDER — ONDANSETRON HCL 4 MG/2ML IJ SOLN
INTRAMUSCULAR | Status: DC | PRN
  Administered 2022-01-14: 4 mg via INTRAVENOUS

## 2022-01-14 MED ORDER — ONDANSETRON HCL 4 MG/2ML IJ SOLN
INTRAMUSCULAR | Status: AC
  Filled 2022-01-14: qty 2

## 2022-01-14 MED ORDER — DEXAMETHASONE SODIUM PHOSPHATE 10 MG/ML IJ SOLN
INTRAMUSCULAR | Status: AC
  Filled 2022-01-14: qty 1

## 2022-01-14 MED ORDER — LIDOCAINE 2% (20 MG/ML) 5 ML SYRINGE
INTRAMUSCULAR | Status: AC
  Filled 2022-01-14: qty 5

## 2022-01-14 MED ORDER — MORPHINE SULFATE (PF) 4 MG/ML IV SOLN
4.0000 mg | Freq: Once | INTRAVENOUS | Status: AC
  Administered 2022-01-14: 4 mg via INTRAVENOUS
  Filled 2022-01-14: qty 1

## 2022-01-14 MED ORDER — PROPOFOL 10 MG/ML IV BOLUS
INTRAVENOUS | Status: AC
  Filled 2022-01-14: qty 20

## 2022-01-14 MED ORDER — DEXAMETHASONE SODIUM PHOSPHATE 10 MG/ML IJ SOLN
INTRAMUSCULAR | Status: DC | PRN
  Administered 2022-01-14: 10 mg via INTRAVENOUS

## 2022-01-14 MED ORDER — SODIUM CHLORIDE 0.9 % IR SOLN
Status: DC | PRN
  Administered 2022-01-14: 1000 mL

## 2022-01-14 MED ORDER — FENTANYL CITRATE (PF) 100 MCG/2ML IJ SOLN
INTRAMUSCULAR | Status: AC
  Filled 2022-01-14: qty 2

## 2022-01-14 MED ORDER — IOHEXOL 300 MG/ML  SOLN
100.0000 mL | Freq: Once | INTRAMUSCULAR | Status: AC | PRN
  Administered 2022-01-14: 85 mL via INTRAVENOUS

## 2022-01-14 MED ORDER — METRONIDAZOLE 500 MG/100ML IV SOLN
500.0000 mg | Freq: Two times a day (BID) | INTRAVENOUS | Status: DC
  Administered 2022-01-14: 500 mg via INTRAVENOUS
  Filled 2022-01-14: qty 100

## 2022-01-14 MED ORDER — SODIUM CHLORIDE 0.9 % IV SOLN: INTRAVENOUS | Status: DC

## 2022-01-14 MED ORDER — SUCCINYLCHOLINE CHLORIDE 200 MG/10ML IV SOSY
PREFILLED_SYRINGE | INTRAVENOUS | Status: AC
  Filled 2022-01-14: qty 10

## 2022-01-14 MED ORDER — FENTANYL CITRATE (PF) 250 MCG/5ML IJ SOLN
INTRAMUSCULAR | Status: AC
  Filled 2022-01-14: qty 5

## 2022-01-14 MED ORDER — IBUPROFEN 400 MG PO TABS
400.0000 mg | ORAL_TABLET | Freq: Four times a day (QID) | ORAL | Status: DC | PRN
Start: 2022-01-14 — End: 2022-01-15

## 2022-01-14 MED ORDER — SODIUM CHLORIDE 0.9 % IV SOLN
1.0000 g | Freq: Once | INTRAVENOUS | Status: AC
  Administered 2022-01-14: 1 g via INTRAVENOUS
  Filled 2022-01-14: qty 10

## 2022-01-14 MED ORDER — MIDAZOLAM HCL 2 MG/2ML IJ SOLN
INTRAMUSCULAR | Status: DC | PRN
  Administered 2022-01-14 (×2): 1 mg via INTRAVENOUS

## 2022-01-14 MED ORDER — SODIUM CHLORIDE 0.9 % IV SOLN: Freq: Once | INTRAVENOUS | Status: AC

## 2022-01-14 MED ORDER — MIDAZOLAM HCL 2 MG/2ML IJ SOLN
INTRAMUSCULAR | Status: AC
  Filled 2022-01-14: qty 2

## 2022-01-14 MED ORDER — LIDOCAINE 2% (20 MG/ML) 5 ML SYRINGE
INTRAMUSCULAR | Status: DC | PRN
  Administered 2022-01-14: 60 mg via INTRAVENOUS

## 2022-01-14 MED ORDER — SUCCINYLCHOLINE CHLORIDE 200 MG/10ML IV SOSY
PREFILLED_SYRINGE | INTRAVENOUS | Status: DC | PRN
  Administered 2022-01-14: 100 mg via INTRAVENOUS

## 2022-01-14 MED ORDER — LACTATED RINGERS IV SOLN: INTRAVENOUS | Status: DC

## 2022-01-14 MED ORDER — ROCURONIUM BROMIDE 10 MG/ML (PF) SYRINGE
PREFILLED_SYRINGE | INTRAVENOUS | Status: AC
  Filled 2022-01-14: qty 10

## 2022-01-14 MED ORDER — ONDANSETRON HCL 4 MG/2ML IJ SOLN: 4.0000 mg | Freq: Four times a day (QID) | INTRAMUSCULAR | Status: DC | PRN

## 2022-01-14 MED ORDER — SUGAMMADEX SODIUM 200 MG/2ML IV SOLN
INTRAVENOUS | Status: DC | PRN
  Administered 2022-01-14 (×2): 100 mg via INTRAVENOUS

## 2022-01-14 MED ORDER — FENTANYL CITRATE (PF) 250 MCG/5ML IJ SOLN
INTRAMUSCULAR | Status: DC | PRN
  Administered 2022-01-14: 100 ug via INTRAVENOUS
  Administered 2022-01-14 (×3): 50 ug via INTRAVENOUS

## 2022-01-14 MED ORDER — CHLORHEXIDINE GLUCONATE 0.12 % MT SOLN
OROMUCOSAL | Status: AC
  Filled 2022-01-14: qty 15

## 2022-01-14 MED ORDER — ACETAMINOPHEN 500 MG PO TABS
1000.0000 mg | ORAL_TABLET | Freq: Four times a day (QID) | ORAL | Status: DC
  Administered 2022-01-14 – 2022-01-15 (×3): 1000 mg via ORAL
  Filled 2022-01-14 (×4): qty 2

## 2022-01-14 MED ORDER — MORPHINE SULFATE (PF) 2 MG/ML IV SOLN
2.0000 mg | INTRAVENOUS | Status: DC | PRN
  Administered 2022-01-14 – 2022-01-15 (×3): 4 mg via INTRAVENOUS
  Filled 2022-01-14 (×3): qty 2

## 2022-01-14 MED ORDER — BUPIVACAINE HCL 0.25 % IJ SOLN
INTRAMUSCULAR | Status: DC | PRN
  Administered 2022-01-14: 30 mL

## 2022-01-14 MED ORDER — 0.9 % SODIUM CHLORIDE (POUR BTL) OPTIME
TOPICAL | Status: DC | PRN
  Administered 2022-01-14: 1000 mL

## 2022-01-14 MED ORDER — ROCURONIUM BROMIDE 10 MG/ML (PF) SYRINGE
PREFILLED_SYRINGE | INTRAVENOUS | Status: DC | PRN
Start: 2022-01-14 — End: 2022-01-14
  Administered 2022-01-14: 10 mg via INTRAVENOUS
  Administered 2022-01-14: 40 mg via INTRAVENOUS

## 2022-01-14 MED ORDER — ONDANSETRON HCL 4 MG/2ML IJ SOLN
4.0000 mg | Freq: Once | INTRAMUSCULAR | Status: AC
  Administered 2022-01-14: 4 mg via INTRAVENOUS
  Filled 2022-01-14: qty 2

## 2022-01-14 MED ORDER — ACETAMINOPHEN 500 MG PO TABS
1000.0000 mg | ORAL_TABLET | ORAL | Status: AC
  Administered 2022-01-14: 1000 mg via ORAL
  Filled 2022-01-14: qty 2

## 2022-01-14 MED ORDER — CHLORHEXIDINE GLUCONATE CLOTH 2 % EX PADS
6.0000 | MEDICATED_PAD | Freq: Once | CUTANEOUS | Status: DC
  Filled 2022-01-14: qty 6

## 2022-01-14 MED ORDER — FENTANYL CITRATE (PF) 100 MCG/2ML IJ SOLN
25.0000 ug | INTRAMUSCULAR | Status: DC | PRN
  Administered 2022-01-14: 50 ug via INTRAVENOUS
  Administered 2022-01-14 (×3): 25 ug via INTRAVENOUS

## 2022-01-14 MED ORDER — OXYCODONE HCL 5 MG PO TABS
5.0000 mg | ORAL_TABLET | ORAL | Status: DC | PRN
  Administered 2022-01-14: 5 mg via ORAL
  Filled 2022-01-14: qty 1

## 2022-01-14 SURGICAL SUPPLY — 42 items
APPLIER CLIP 5 13 M/L LIGAMAX5 (MISCELLANEOUS) ×2
BAG COUNTER SPONGE SURGICOUNT (BAG) ×2 IMPLANT
BAG SPEC RTRVL 10 TROC 200 (ENDOMECHANICALS) ×1
CANISTER SUCT 3000ML PPV (MISCELLANEOUS) ×2 IMPLANT
CHLORAPREP W/TINT 26 (MISCELLANEOUS) ×2 IMPLANT
CLIP APPLIE 5 13 M/L LIGAMAX5 (MISCELLANEOUS) ×1 IMPLANT
COVER SURGICAL LIGHT HANDLE (MISCELLANEOUS) ×2 IMPLANT
DERMABOND ADVANCED (GAUZE/BANDAGES/DRESSINGS) ×1
DERMABOND ADVANCED .7 DNX12 (GAUZE/BANDAGES/DRESSINGS) ×1 IMPLANT
ELECT REM PT RETURN 9FT ADLT (ELECTROSURGICAL) ×2
ELECTRODE REM PT RTRN 9FT ADLT (ELECTROSURGICAL) ×1 IMPLANT
ENDOLOOP SUT PDS II  0 18 (SUTURE) ×2
ENDOLOOP SUT PDS II 0 18 (SUTURE) IMPLANT
GLOVE BIO SURGEON STRL SZ7.5 (GLOVE) ×2 IMPLANT
GLOVE BIOGEL PI IND STRL 8 (GLOVE) ×1 IMPLANT
GLOVE BIOGEL PI INDICATOR 8 (GLOVE) ×1
GOWN STRL REUS W/ TWL LRG LVL3 (GOWN DISPOSABLE) ×2 IMPLANT
GOWN STRL REUS W/ TWL XL LVL3 (GOWN DISPOSABLE) ×1 IMPLANT
GOWN STRL REUS W/TWL LRG LVL3 (GOWN DISPOSABLE) ×4
GOWN STRL REUS W/TWL XL LVL3 (GOWN DISPOSABLE) ×2
GRASPER SUT TROCAR 14GX15 (MISCELLANEOUS) ×2 IMPLANT
KIT BASIN OR (CUSTOM PROCEDURE TRAY) ×2 IMPLANT
KIT TURNOVER KIT B (KITS) ×2 IMPLANT
NDL INSUFFLATION 14GA 120MM (NEEDLE) ×1 IMPLANT
NEEDLE INSUFFLATION 14GA 120MM (NEEDLE) ×2 IMPLANT
NS IRRIG 1000ML POUR BTL (IV SOLUTION) ×2 IMPLANT
PAD ARMBOARD 7.5X6 YLW CONV (MISCELLANEOUS) ×2 IMPLANT
POUCH RETRIEVAL ECOSAC 10 (ENDOMECHANICALS) ×1 IMPLANT
POUCH RETRIEVAL ECOSAC 10MM (ENDOMECHANICALS) ×2
SCISSORS LAP 5X35 DISP (ENDOMECHANICALS) ×2 IMPLANT
SET IRRIG TUBING LAPAROSCOPIC (IRRIGATION / IRRIGATOR) ×2 IMPLANT
SET TUBE SMOKE EVAC HIGH FLOW (TUBING) ×2 IMPLANT
SLEEVE ENDOPATH XCEL 5M (ENDOMECHANICALS) ×4 IMPLANT
SPECIMEN JAR SMALL (MISCELLANEOUS) ×2 IMPLANT
SUT MNCRL AB 4-0 PS2 18 (SUTURE) ×4 IMPLANT
TOWEL GREEN STERILE (TOWEL DISPOSABLE) ×2 IMPLANT
TOWEL GREEN STERILE FF (TOWEL DISPOSABLE) ×2 IMPLANT
TRAY LAPAROSCOPIC MC (CUSTOM PROCEDURE TRAY) ×2 IMPLANT
TROCAR XCEL NON-BLD 11X100MML (ENDOMECHANICALS) ×2 IMPLANT
TROCAR XCEL NON-BLD 5MMX100MML (ENDOMECHANICALS) ×2 IMPLANT
WARMER LAPAROSCOPE (MISCELLANEOUS) ×2 IMPLANT
WATER STERILE IRR 1000ML POUR (IV SOLUTION) ×2 IMPLANT

## 2022-01-14 NOTE — Anesthesia Preprocedure Evaluation (Signed)
Anesthesia Evaluation  ?Patient identified by MRN, date of birth, ID band ?Patient awake ? ? ? ?Reviewed: ?Allergy & Precautions, NPO status , Patient's Chart, lab work & pertinent test results ? ?Airway ?Mallampati: II ? ?TM Distance: >3 FB ?Neck ROM: Full ? ? ? Dental ?no notable dental hx. ? ?  ?Pulmonary ?neg pulmonary ROS,  ?  ?Pulmonary exam normal ? ? ? ? ? ? ? Cardiovascular ?negative cardio ROS ? ? ?Rhythm:Regular Rate:Normal ? ? ?  ?Neuro/Psych ?Depression negative neurological ROS ?   ? GI/Hepatic ?Neg liver ROS, Acute cholecystitis  ?  ?Endo/Other  ?negative endocrine ROS ? Renal/GU ?negative Renal ROS  ?negative genitourinary ?  ?Musculoskeletal ?negative musculoskeletal ROS ?(+)  ? Abdominal ?Normal abdominal exam  (+)   ?Peds ? Hematology ?negative hematology ROS ?(+)   ?Anesthesia Other Findings ? ? Reproductive/Obstetrics ? ?  ? ? ? ? ? ? ? ? ? ? ? ? ? ?  ?  ? ? ? ? ? ? ? ? ?Anesthesia Physical ?Anesthesia Plan ? ?ASA: 2 ? ?Anesthesia Plan: General  ? ?Post-op Pain Management:   ? ?Induction: Intravenous ? ?PONV Risk Score and Plan: 3 and Ondansetron, Dexamethasone, Midazolam and Treatment may vary due to age or medical condition ? ?Airway Management Planned: Mask and Oral ETT ? ?Additional Equipment: None ? ?Intra-op Plan:  ? ?Post-operative Plan: Extubation in OR ? ?Informed Consent: I have reviewed the patients History and Physical, chart, labs and discussed the procedure including the risks, benefits and alternatives for the proposed anesthesia with the patient or authorized representative who has indicated his/her understanding and acceptance.  ? ? ? ?Dental advisory given ? ?Plan Discussed with: CRNA ? ?Anesthesia Plan Comments: (Lab Results ?     Component                Value               Date                 ?     PREGTESTUR               NEGATIVE            01/13/2022           ?     HCG                      <5.0                08/28/2021           ?Lab  Results ?     Component                Value               Date                 ?     NA                       136                 01/13/2022           ?     K                        3.5  01/13/2022           ?     CO2                      23                  01/13/2022           ?     GLUCOSE                  80                  01/13/2022           ?     BUN                      9                   01/13/2022           ?     CREATININE               0.78                01/13/2022           ?     CALCIUM                  9.7                 01/13/2022           ?     GFRNONAA                 >60                 01/13/2022           ?Lab Results ?     Component                Value               Date                 ?     WBC                      16.6 (H)            01/13/2022           ?     HGB                      13.3                01/13/2022           ?     HCT                      42.0                01/13/2022           ?     MCV                      83.8                01/13/2022           ?     PLT  303                 01/13/2022          )  ? ? ? ? ? ? ?Anesthesia Quick Evaluation ? ?

## 2022-01-14 NOTE — H&P (Signed)
?[facility] ?Admission Note ? ?Joyce Heinzourtney R Poulsen ?03/12/2001  ?[id number].   ? ?Requesting MD: Geoffery Lyonsouglas Delo ?Chief Complaint/Reason for Consult: cholecystitis ? ?HPI:  ?Joyce [name] is a 20yo female PMH hypothyroidism and anxiety/ depression who was transferred from [facility] to [facility] for evaluation by general surgery for cholecystitis. Patient states that she developed abdominal pain 5 days ago. The pain became acutely worse 2 days ago. She reports pain is epigastric/ RUQ. Constant, worse with PO intake. Associated with nausea, vomiting, diarrhea. Denies fever, chills, dysuria. The only time she has had similar pain in the past was when she did not take her thyroid medication. ? ?Abdominal surgical history: none ?Anticoagulants: none ?Nonsmoker ?Denies alcohol or illicit drug use ?Employment: daycare ? ?Review of Systems  ?Constitutional: Negative.   ?Gastrointestinal:  Positive for abdominal pain, diarrhea, nausea and vomiting.  ? ?All systems reviewed and otherwise negative except for as above ? ?Family History  ?Problem Relation Age of Onset  ? Anxiety disorder Mother   ? Depression Mother   ? Hypertension Mother   ? Depression Father   ? Healthy Father   ? Drug abuse Brother   ? Hypertension Paternal Grandmother   ? Diabetes Paternal Grandmother   ? ? ?Past Medical History:  ?Diagnosis Date  ? Constipation   ? Hypopituitarism (HCC)   ? Left foot drop   ? Thyroid disease   ? ? ?History reviewed. No pertinent surgical history. ? ?Social History:  reports that she has never smoked. She has never used smokeless tobacco. She reports that she does not currently use alcohol. She reports that she does not use drugs. ? ?Allergies: No Known Allergies ? ?Medications Prior to Admission  ?Medication Sig Dispense Refill  ? acetaminophen (TYLENOL) 500 MG tablet Take 1,000 mg by mouth every 6 (six) hours as needed for mild pain.    ? ARIPiprazole (ABILIFY) 10 MG tablet Take 1 tablet (10 mg total) by mouth daily. 30  tablet 2  ? FLUoxetine (PROZAC) 20 MG capsule Take 1 capsule (20 mg total) by mouth daily. 30 capsule 2  ? levothyroxine (SYNTHROID) 100 MCG tablet Take 100 mcg by mouth every morning.    ? ? ?Prior to Admission medications   ?Medication Sig Start Date End Date Taking? Authorizing Provider  ?acetaminophen (TYLENOL) 500 MG tablet Take 1,000 mg by mouth every 6 (six) hours as needed for mild pain.   Yes [provider]  ?ARIPiprazole (ABILIFY) 10 MG tablet Take 1 tablet (10 mg total) by mouth daily. 09/04/21  Yes Joan FloresWhite, Brian A, NP  ?FLUoxetine (PROZAC) 20 MG capsule Take 1 capsule (20 mg total) by mouth daily. 09/04/21  Yes Joan FloresWhite, Brian A, NP  ?levothyroxine (SYNTHROID) 100 MCG tablet Take 100 mcg by mouth every morning. 03/02/21  Yes [provider]  ? ? ?Blood pressure 129/82, pulse 60, temperature 98.4 ?F (36.9 ?C), temperature source Oral, resp. rate 17, height 5\' 9"  (1.753 m), weight 91.2 kg, last menstrual period 01/05/2022, SpO2 98 %. ?Physical Exam: ?General: pleasant, WD/WN female who is laying in bed in NAD ?HEENT: head is normocephalic, atraumatic.  Sclera are noninjected.  Pupils equal and round.  Ears and nose without any masses or lesions.  Mouth is pink and moist. Dentition fair ?Heart: regular, rate, and rhythm.  Normal s1,s2. No obvious murmurs, gallops, or rubs noted.  Palpable pedal pulses bilaterally  ?Lungs: CTAB, no wheezes, rhonchi, or rales noted.  Respiratory effort nonlabored ?Abd: soft, ND, +BS, no masses, hernias,  or organomegaly. TTP epigastric and RUQ with voluntary guarding, no peritonitis ?MS: no BUE/BLE edema, calves soft and nontender ?Skin: warm and dry with no masses, lesions, or rashes ?Psych: A&Ox4 with an appropriate affect ?Neuro: MAEs ? ?Results for orders placed or performed during the hospital encounter of 01/14/22 (from the past 48 hour(s))  ?Lipase, blood     Status: None  ? Collection Time: 01/13/22 10:15 PM  ?Result Value Ref Range  ? Lipase 12 11 - 51  U/L  ?  Comment: Performed at [facility], [address], [place], [place] [address]  ?Comprehensive metabolic panel     Status: Abnormal  ? Collection Time: 01/13/22 10:15 PM  ?Result Value Ref Range  ? Sodium 136 135 - 145 mmol/L  ? Potassium 3.5 3.5 - 5.1 mmol/L  ? Chloride 102 98 - 111 mmol/L  ? CO2 23 22 - 32 mmol/L  ? Glucose, Bld 80 70 - 99 mg/dL  ?  Comment: Glucose reference range applies only to samples taken after fasting for at least 8 hours.  ? BUN 9 6 - 20 mg/dL  ? Creatinine, Ser 0.78 0.44 - 1.00 mg/dL  ? Calcium 9.7 8.9 - 10.3 mg/dL  ? Total Protein 8.4 (H) 6.5 - 8.1 g/dL  ? Albumin 4.4 3.5 - 5.0 g/dL  ? AST 25 15 - 41 U/L  ? ALT 14 0 - 44 U/L  ? Alkaline Phosphatase 78 38 - 126 U/L  ? Total Bilirubin 0.7 0.3 - 1.2 mg/dL  ? GFR, Estimated >60 >60 mL/min  ?  Comment: (NOTE) ?Calculated using the CKD-EPI Creatinine Equation (2021) ?  ? Anion gap 11 5 - 15  ?  Comment: Performed at [facility], [address], [place], [place] [address]  ?CBC     Status: Abnormal  ? Collection Time: 01/13/22 10:15 PM  ?Result Value Ref Range  ? WBC 16.6 (H) 4.0 - 10.5 K/uL  ? RBC 5.01 3.87 - 5.11 MIL/uL  ? Hemoglobin 13.3 12.0 - 15.0 g/dL  ? HCT 42.0 36.0 - 46.0 %  ? MCV 83.8 80.0 - 100.0 fL  ? MCH 26.5 26.0 - 34.0 pg  ? MCHC 31.7 30.0 - 36.0 g/dL  ? RDW 14.8 11.5 - 15.5 %  ? Platelets 303 150 - 400 K/uL  ? nRBC 0.0 0.0 - 0.2 %  ?  Comment: Performed at [facility], [address] [place], [place] [address]  ?Urinalysis, Routine w reflex microscopic Urine, Clean Catch     Status: Abnormal  ? Collection Time: 01/13/22 10:15 PM  ?Result Value Ref Range  ? Color, Urine YELLOW YELLOW  ? APPearance CLEAR CLEAR  ? Specific Gravity, Urine 1.042 (H) 1.005 - 1.030  ? pH 6.0 5.0 - 8.0  ? Glucose, UA NEGATIVE NEGATIVE mg/dL  ? Hgb urine dipstick NEGATIVE NEGATIVE  ? Bilirubin Urine NEGATIVE NEGATIVE  ? Ketones, ur >80 (A) NEGATIVE mg/dL  ? Protein, ur 30 (A) NEGATIVE  mg/dL  ? Nitrite NEGATIVE NEGATIVE  ? Leukocytes,Ua NEGATIVE NEGATIVE  ? RBC / HPF 0-5 0 - 5 RBC/hpf  ? WBC, UA 0-5 0 - 5 WBC/hpf  ? Bacteria, UA RARE (A) NONE SEEN  ? Squamous Epithelial / LPF 0-5 0 - 5  ? Mucus PRESENT   ?  Comment: Performed at [facility], [address], [place], [place] [address]  ?Pregnancy, urine     Status: None  ? Collection Time: 01/13/22 10:15 PM  ?Result Value  Ref Range  ? Preg Test, Ur NEGATIVE NEGATIVE  ?  Comment:        ?THE SENSITIVITY OF THIS ?METHODOLOGY IS >20 mIU/mL. ?Performed at [facility], [address] [place], [place] [address] ?  ? ?CT ABDOMEN PELVIS W CONTRAST ? ?Result Date: 01/14/2022 ?CLINICAL DATA:  Acute abdominal pain. EXAM: CT ABDOMEN AND PELVIS WITH CONTRAST TECHNIQUE: Multidetector CT imaging of the abdomen and pelvis was performed using the standard protocol following bolus administration of intravenous contrast. RADIATION DOSE REDUCTION: This exam was performed according to the departmental dose-optimization program which includes automated exposure control, adjustment of the mA and/or kV according to patient size and/or use of iterative reconstruction technique. CONTRAST:  5mL OMNIPAQUE IOHEXOL 300 MG/ML  SOLN COMPARISON:  CT abdomen and pelvis 09/29/2017. FINDINGS: Lower chest: No acute abnormality. Hepatobiliary: Gallstones are again seen. There is diffuse gallbladder wall edema. The gallbladder is distended. There is mild pericholecystic fluid. There is no biliary ductal dilatation. The liver appears within normal limits. Pancreas: Unremarkable. No pancreatic ductal dilatation or surrounding inflammatory changes. Spleen: Normal in size without focal abnormality. Adrenals/Urinary Tract: Adrenal glands are unremarkable. Kidneys are normal, without renal calculi, focal lesion, or hydronephrosis. Bladder is unremarkable. Stomach/Bowel: Stomach is within normal limits. Appendix appears normal. No evidence of bowel  wall thickening, distention, or inflammatory changes. Vascular/Lymphatic: No significant vascular findings are present. No enlarged abdominal or pelvic lymph nodes. Reproductive: There is a 2.8 cm cyst in th

## 2022-01-14 NOTE — Op Note (Signed)
? ?Patient: Joyce [name] (10-31-2000, [id number]) ? ?Date of Surgery: 01/14/2022  ? ?Preoperative Diagnosis: Acute cholecystitis  ? ?Postoperative Diagnosis: Acute cholecystitis  ? ?Surgical Procedure: LAPAROSCOPIC CHOLECYSTECTOMY:   ? ?Operative Team Members:  ?Surgeon(s) and Role: ?   * [place] Spainhower, Nickola Major, MD - Primary  ? ?Anesthesiologist: Darral Dash, DO ?CRNA: Trinna Post., CRNA; Janene Harvey, CRNA  ? ?Anesthesia: General  ? ?Fluids:  ?Total I/O ?In: 700 [I.V.:700] ?Out: 25 [Blood:25] ? ?Complications: None ? ?Drains:  none  ? ?Specimen:  ?ID Type Source Tests Collected by Time Destination  ?1 : gallbladder Tissue PATH Gallbladder SURGICAL PATHOLOGY Stephinie Battisti, Nickola Major, MD 01/14/2022 1151   ?  ? ?Disposition:  PACU - hemodynamically stable. ? ?Plan of Care:  Overnight observation ? ? ? ?Indications for Procedure: Joyce [name] is a 21 y.o. female who presented with abdominal pain after eating a Baconator with cheese.  History, physical and imaging was concerning for acute cholecystitis.  Laparoscopic cholecystectomy was recommended for the patient.  The procedure itself, as well as the risks, benefits and alternatives were discussed with the patient.  Risks discussed included but were not limited to the risk of infection, bleeding, damage to nearby structures, need to convert to open procedure, incisional hernia, bile leak, common bile duct injury and the need for additional procedures or surgeries.  With this discussion complete and all questions answered the patient granted consent to proceed. ? ?Findings: Severely inflamed gallbladder with gallstones ? ?Infection status: ?Patient: Joyce [name] Emergency General Surgery Service Patient ?Case: Urgent ?Infection Present At Time Of Surgery (PATOS):  Inflamed gallbladder ? ? ?Description of Procedure:  ? ?On the date stated above, the patient was taken to the operating room suite and placed in supine positioning.  Sequential compression  devices were placed on the lower extremities to prevent blood clots.  General endotracheal anesthesia was induced. Preoperative antibiotics were given.  The patient's abdomen was prepped and draped in the usual sterile fashion.  A time-out was completed verifying the correct patient, procedure, positioning and equipment needed for the case. ? ?We began by anesthetizing the skin with local anesthetic and then making a 5 mm incision just below the umbilicus.  We dissected through the subcutaneous tissues to the fascia.  The fascia was grasped and elevated using a Kocher clamp.  A Veress needle was inserted into the abdomen and the abdomen was insufflated to 15 mmHg.  A 5 mm trocar was inserted in this position under optical guidance and then the abdomen was inspected.  There was no trauma to the underlying viscera with initial trocar placement.  Any abnormal findings, other than inflammation in the right upper quadrant, are listed above in the findings section.  Three additional trocars were placed, one 12 mm trocar in the subxiphoid position, one 5 mm trocar in the midline epigastric area and one 86mm trocar in the right upper quadrant subcostally.  These were placed under direct vision without any trauma to the underlying viscera.   ? ?The patient was then placed in head up, left side down positioning.  The gallbladder was identified and dissected free from its attachments to the omentum allowing the duodenum to fall away.  The infundibulum of the gallbladder was dissected free working laterally to medially.  The cystic duct and cystic artery were dissected free from surrounding connective tissue.  The infundibulum of the gallbladder was dissected off the cystic plate.  A critical view of safety was obtained with  the cystic duct and cystic artery being cleared of connective tissues and clearly the only two structures entering into the gallbladder with the liver clearly visible behind.  Clips were then applied to the  cystic duct and cystic artery and then these structures were divided.  The gallbladder was dissected off the cystic plate, placed in an endocatch bag and removed from the 12 mm subxiphoid port site.  The clips were inspected and appeared effective.  The cystic plate was inspected and hemostasis was obtained using electrocautery.  A suction irrigator was used to clean the operative field.  Attention was turned to closure.  The 12 mm subxiphoid port site was closed using a 0-vicryl suture on a fascial suture passer.  The abdomen was desufflated.  The skin was closed using 4-0 monocryl and dermabond.  All sponge and needle counts were correct at the conclusion of the case. ? ? ? ?Louanna Raw, MD ?General, Bariatric, & Minimally Invasive Surgery ?[facility], [place] ? ?

## 2022-01-14 NOTE — Transfer of Care (Signed)
Immediate Anesthesia Transfer of Care Note ? ?Patient: Joyce [name] ? ?Procedure(s) Performed: LAPAROSCOPIC CHOLECYSTECTOMY (Abdomen) ? ?Patient Location: PACU ? ?Anesthesia Type:General ? ?Level of Consciousness: awake, alert , oriented and drowsy ? ?Airway & Oxygen Therapy: Patient Spontanous Breathing and Patient connected to nasal cannula oxygen ? ?Post-op Assessment: Report given to RN and Post -op Vital signs reviewed and stable ? ?Post vital signs: Reviewed and stable ? ?Last Vitals:  ?Vitals Value Taken Time  ?BP 125/74 01/14/22 1246  ?Temp    ?Pulse 88 01/14/22 1247  ?Resp 22 01/14/22 1247  ?SpO2 100 % 01/14/22 1247  ?Vitals shown include unvalidated device data. ? ?Last Pain:  ?Vitals:  ? 01/14/22 0932  ?TempSrc: Oral  ?PainSc:   ?   ? ?  ? ?Complications: No notable events documented. ?

## 2022-01-14 NOTE — ED Provider Notes (Signed)
Signout from Dr. Judd Lien.  21 year old with abdominal pain.  CT [place] for acute cholecystitis.  General surgery consulted and she is on-call to the operating room. ?Physical Exam  ?BP 108/67   Pulse (!) 57   Temp 98.3 ?F (36.8 ?C) (Oral)   Resp 16   Ht 5\' 9"  (1.753 m)   Wt 99.8 kg   SpO2 100%   BMI 32.49 kg/m?  ? ?Physical Exam ? ?Procedures  ?Procedures ? ?ED Course / MDM  ?  ?Medical Decision Making ?Amount and/or Complexity of Data Reviewed ?Labs: ordered. ?Radiology: ordered. ? ?Risk ?Prescription drug management. ? ? ?[facility] here to transport patient over to [facility]. ? ? ? ? ?  ?[facility], MD ?01/15/22 [phone] ? ?

## 2022-01-14 NOTE — ED Notes (Signed)
Handoff report given to [facility] RN at Short Stay at [facility]  ?

## 2022-01-14 NOTE — Anesthesia Procedure Notes (Signed)
Procedure Name: Intubation ?Date/Time: 01/14/2022 11:29 AM ?Performed by: Janene Harvey, CRNA ?Pre-anesthesia Checklist: Patient identified, Emergency Drugs available, Suction available and Patient being monitored ?Patient Re-evaluated:Patient Re-evaluated prior to induction ?Oxygen Delivery Method: Circle system utilized ?Preoxygenation: Pre-oxygenation with 100% oxygen ?Induction Type: IV induction and Rapid sequence ?Laryngoscope Size: Mac and 4 ?Grade View: Grade I ?Tube type: Oral ?Tube size: 7.0 mm ?Number of attempts: 1 ?Airway Equipment and Method: Stylet and Oral airway ?Placement Confirmation: ETT inserted through vocal cords under direct vision, positive ETCO2 and breath sounds checked- equal and bilateral ?Secured at: 21 cm ?Tube secured with: Tape ?Dental Injury: Teeth and Oropharynx as per pre-operative assessment  ? ? ? ? ?

## 2022-01-14 NOTE — ED Provider Notes (Signed)
?[facility] DEPT ?Provider Note ? ? ?CSN: [id number] ?Arrival date & time: 01/13/22  2134 ? ?  ? ?History ? ?Chief Complaint  ?Patient presents with  ? Abdominal Pain  ? ? ?Joyce [name] is a 21 y.o. female. ? ?Patient is a 21 year old female with no significant past medical history.  Patient presenting today with complaints of abdominal pain.  She describes pain starting yesterday evening and worsening throughout the day.  Pain initially periumbilical, but now extends to the epigastrium and right lower quadrant.  She has had some nausea and vomiting as well as loose stools, but denies any bloody stool or vomit.  She denies fevers or chills.  She denies urinary complaints.  Last menstrual period was 1 month ago and normal. ? ?The history is provided by the patient.  ?Abdominal Pain ?Pain location:  Periumbilical ?Pain quality: cramping   ?Pain radiates to:  Does not radiate ?Pain severity:  Moderate ?Onset quality:  Gradual ?Duration:  1 day ?Timing:  Constant ?Progression:  Worsening ?Chronicity:  New ?Relieved by:  Nothing ?Worsened by:  Movement and palpation ? ?  ? ?Home Medications ?Prior to Admission medications   ?Medication Sig Start Date End Date Taking? Authorizing Provider  ?ARIPiprazole (ABILIFY) 10 MG tablet Take 1 tablet (10 mg total) by mouth daily. 09/04/21   Elwanda Brooklyn, NP  ?FLUoxetine (PROZAC) 20 MG capsule Take 1 capsule (20 mg total) by mouth daily. 09/04/21   Elwanda Brooklyn, NP  ?levothyroxine (SYNTHROID) 100 MCG tablet Take 100 mcg by mouth every morning. 03/02/21   [provider]  ?   ? ?Allergies    ?Patient has no known allergies.   ? ?Review of Systems   ?Review of Systems  ?Gastrointestinal:  Positive for abdominal pain.  ?All other systems reviewed and are negative. ? ?Physical Exam ?Updated Vital Signs ?BP 117/83 (BP Location: Right Arm)   Pulse 60   Temp 98.6 ?F (37 ?C) (Oral)   Resp 18   Ht 5\' 9"  (1.753 m)   Wt 99.8 kg   SpO2 100%   BMI 32.49  kg/m?  ?Physical Exam ?Vitals and nursing note reviewed.  ?Constitutional:   ?   General: She is not in acute distress. ?   Appearance: She is well-developed. She is not diaphoretic.  ?HENT:  ?   Head: Normocephalic and atraumatic.  ?Cardiovascular:  ?   Rate and Rhythm: Normal rate and regular rhythm.  ?   Heart sounds: No murmur heard. ?  No friction rub. No gallop.  ?Pulmonary:  ?   Effort: Pulmonary effort is normal. No respiratory distress.  ?   Breath sounds: Normal breath sounds. No wheezing.  ?Abdominal:  ?   General: Bowel sounds are normal. There is no distension.  ?   Palpations: Abdomen is soft.  ?   Tenderness: There is abdominal tenderness in the right upper quadrant, right lower quadrant, epigastric area and periumbilical area. There is no right CVA tenderness, left CVA tenderness, guarding or rebound.  ?Musculoskeletal:     ?   General: Normal range of motion.  ?   Cervical back: Normal range of motion and neck supple.  ?Skin: ?   General: Skin is warm and dry.  ?Neurological:  ?   General: No focal deficit present.  ?   Mental Status: She is alert and oriented to person, place, and time.  ? ? ?ED Results / Procedures / Treatments   ?Labs ?(all labs ordered are  listed, but only abnormal results are displayed) ?Labs Reviewed  ?COMPREHENSIVE METABOLIC PANEL - Abnormal; Notable for the following components:  ?    Result Value  ? Total Protein 8.4 (*)   ? All other components within normal limits  ?CBC - Abnormal; Notable for the following components:  ? WBC 16.6 (*)   ? All other components within normal limits  ?URINALYSIS, ROUTINE W REFLEX MICROSCOPIC - Abnormal; Notable for the following components:  ? Specific Gravity, Urine 1.042 (*)   ? Ketones, ur >80 (*)   ? Protein, ur 30 (*)   ? Bacteria, UA RARE (*)   ? All other components within normal limits  ?LIPASE, BLOOD  ?PREGNANCY, URINE  ? ? ?EKG ?None ? ?Radiology ?No results found. ? ?Procedures ?Procedures  ? ? ?Medications Ordered in  ED ?Medications  ?sodium chloride 0.9 % bolus 1,000 mL (has no administration in time range)  ?ondansetron (ZOFRAN) injection 4 mg (has no administration in time range)  ?morphine (PF) 4 MG/ML injection 4 mg (has no administration in time range)  ? ? ?ED Course/ Medical Decision Making/ A&P ? ?This patient presents to the ED for concern of abdominal pain, this involves an extensive number of treatment options, and is a complaint that carries with it a high risk of complications and morbidity.  The differential diagnosis includes acute appendicitis, acute cholecystitis, small bowel obstruction, pancreatitis, etc. ? ? ?Co morbidities that complicate the patient evaluation ? ?None ? ? ?Additional history obtained: ? ?No additional history or external records needed ? ? ?Lab Tests: ? ?I Ordered, and personally interpreted labs.  The pertinent results include: Leukocytosis with white count of 17,000, but is otherwise unremarkable.  There is no elevation of LFTs or lipase ? ? ?Imaging Studies ordered: ? ?I ordered imaging studies including CT scan of the abdomen and pelvis ?I independently visualized and interpreted imaging which showed acute cholecystitis ?I agree with the radiologist interpretation ? ? ?Cardiac Monitoring: / EKG: ? ?None performed ? ? ?Consultations Obtained: ? ?I requested consultation with the general surgery, Dr. Bobbye Morton,  and discussed lab and imaging findings as well as pertinent plan - they recommend: Transfer to [facility] for cholecystectomy. ? ? ?Problem List / ED Course / Critical interventions / Medication management ? ?Patient presenting with abdominal pain, nausea, and loose stools.  Patient has leukocytosis with white count of 17,000 and CT scan that shows acute cholecystitis.  This was discussed with Dr. Bobbye Morton from general surgery who has agreed to accept the patient in transfer.  She has recommended Rocephin and Flagyl. ?I ordered medication including morphine and Zofran for pain and  nausea as well as Rocephin and Flagyl for infection ?Reevaluation of the patient after these medicines showed that the patient improved ?I have reviewed the patients home medicines and have made adjustments as needed ? ? ?Social Determinants of Health: ? ?None ? ? ?Test / Admission - Considered: ? ?Patient awaiting transfer to [facility] for lap chole. ? ? ?Final Clinical Impression(s) / ED Diagnoses ?Final diagnoses:  ?None  ? ? ?Rx / DC Orders ?ED Discharge Orders   ? ? None  ? ?  ? ? ?  ?Veryl Speak, MD ?01/14/22 [phone] ? ?

## 2022-01-14 NOTE — Discharge Instructions (Addendum)
[facility] ? ?Please arrive at least 30 min before your appointment to complete your check in paperwork.  If you are unable to arrive 30 min prior to your appointment time we may have to cancel or reschedule you. ?LAPAROSCOPIC SURGERY: POST OP INSTRUCTIONS ?Always review your discharge instruction sheet given to you by the facility where your surgery was performed. ?IF YOU HAVE DISABILITY OR FAMILY LEAVE FORMS, YOU MUST BRING THEM TO THE OFFICE FOR PROCESSING.   ?DO NOT GIVE THEM TO YOUR DOCTOR. ? ?PAIN CONTROL ? ?First take acetaminophen (Tylenol) AND/or ibuprofen (Advil) to control your pain after surgery.  Follow directions on package.  Taking acetaminophen (Tylenol) and/or ibuprofen (Advil) regularly after surgery will help to control your pain and lower the amount of prescription pain medication you may need.  You should not take more than 4,000 mg (4 grams) of acetaminophen (Tylenol) in 24 hours.  You should not take ibuprofen (Advil), aleve, motrin, naprosyn or other NSAIDS if you have a history of stomach ulcers or chronic kidney disease.  ?A prescription for pain medication may be given to you upon discharge.  Take your pain medication as prescribed, if you still have uncontrolled pain after taking acetaminophen (Tylenol) or ibuprofen (Advil). ?Use ice packs to help control pain. ?If you need a refill on your pain medication, please contact your pharmacy.  They will contact our office to request authorization. Prescriptions will not be filled after 5pm or on week-ends. ? ?HOME MEDICATIONS ?Take your usually prescribed medications unless otherwise directed. ? ?DIET ?You should follow a light diet the first few days after arrival home.  Be sure to include lots of fluids daily. Avoid fatty, fried foods.  ? ?CONSTIPATION ?It is common to experience some constipation after surgery and if you are taking pain medication.  Increasing fluid intake and taking a stool softener (such as Colace)  will usually help or prevent this problem from occurring.  A mild laxative (Milk of Magnesia or Miralax) should be taken according to package instructions if there are no bowel movements after 48 hours. ? ?WOUND/INCISION CARE ?Most patients will experience some swelling and bruising in the area of the incisions.  Ice packs will help.  Swelling and bruising can take several days to resolve.  ?Unless discharge instructions indicate otherwise, follow guidelines below  ?STERI-STRIPS - you may remove your outer bandages 48 hours after surgery, and you may shower at that time.  You have steri-strips (small skin tapes) in place directly over the incision.  These strips should be left on the skin for 7-10 days.   ?DERMABOND/SKIN GLUE - you may shower in 24 hours.  The glue will flake off over the next 2-3 weeks. ?Any sutures or staples will be removed at the office during your follow-up visit. ? ?ACTIVITIES ?You may resume regular (light) daily activities beginning the next day--such as daily self-care, walking, climbing stairs--gradually increasing activities as tolerated.  You may have sexual intercourse when it is comfortable.  Refrain from any heavy lifting or straining until approved by your doctor. ?You may drive when you are no longer taking prescription pain medication, you can comfortably wear a seatbelt, and you can safely maneuver your car and apply brakes. ? ?FOLLOW-UP ?You should see your doctor in the office for a follow-up appointment approximately 2-3 weeks after your surgery.  You should have been given your post-op/follow-up appointment when your surgery was scheduled.  If you did not receive a post-op/follow-up appointment, make sure   that you call for this appointment within a day or two after you arrive home to insure a convenient appointment time. ? ?OTHER INSTRUCTIONS ? ?WHEN TO CALL YOUR DOCTOR: ?Fever over 101.0 ?Inability to urinate ?Continued bleeding from incision. ?Increased pain, redness, or  drainage from the incision. ?Increasing abdominal pain ? ?The clinic staff is available to answer your questions during regular business hours.  Please don?t hesitate to call and ask to speak to one of the nurses for clinical concerns.  If you have a medical emergency, go to the nearest emergency room or call 911.  A surgeon from [facility] is always on call at the hospital. ?[address], [place], [place], [place]  [address] ? [address], [place], [place]   [address] ?[phone] ? [phone] ? FAX [phone] ? ? ? ?CHOLECYSTECTOMY POST OPERATIVE INSTRUCTIONS ? ?Thinking Clearly  ?The anesthesia may cause you to feel different for 1 or 2 days. Do not drive, drink alcohol, or make any big decisions for at least 2 days. ? ?Nutrition ?When you wake up, you will be able to drink small amounts of liquid. If you do not feel sick, you can slowly advance your diet to regular foods. ?Continue to drink lots of fluids, usually about 8 to 10 glasses per day. ?Eat a high-fiber diet so you don?t strain during bowel movements. ?High-Fiber Foods ?Foods high in fiber include beans, bran cereals and whole-grain breads, peas, dried fruit (figs, apricots, and dates), raspberries, blackberries, strawberries, sweet corn, broccoli, baked potatoes with skin, plums, pears, apples, greens, and nuts. ?Activity ?Slowly increase your activity. Be sure to get up and walk every hour or so to prevent blood clots. ?No heavy lifting or strenuous activity for 4 weeks following surgery to prevent hernias at your incision sites ?It is normal to feel tired. You may need more sleep than usual.  Get your rest but make sure to get up and move around frequently to prevent blood clots and pneumonia. ? ?Work and Return to [employer] ?You can go back to work when you feel well enough. Discuss the timing with your surgeon. ?You can usually go back to school or work 1 week after an operation. ?If your work requires heavy lifting or  strenuous activity you need to be placed on light duty for 4 weeks following surgery. ?You can return to gym class, sports or other physical activities 4 weeks after surgery. ? ?Wound Care ?Always wash your hands before and after touching near your incision site. ?Do not soak in a bathtub until cleared at your follow up appointment. You may take a shower 24 hours after surgery. ?A small amount of drainage from the incision is normal. If the drainage is thick and yellow or the site is red, you may have an infection, so call your surgeon. ?If you have a drain in one of your incisions, it will be taken out in office when the drainage stops. ?Steri-Strips will fall off in 7 to 10 days or they will be removed during your first office visit. ?If you have dermabond glue covering over the incision, allow the glue to flake off on its own. ?Avoid wearing tight or rough clothing. It may rub your incisions and make it harder for them to heal. ?Protect the new skin, especially from the sun. The sun can burn and cause darker scarring. ?Your scar will heal in about 4 to 6 weeks and will become softer and continue to fade over the next  year.  The cosmetic appearance of the incisions will improve over the course of the first year after surgery. ?Sensation around your incision will return in a few weeks or months. ? ?Bowel Movements ?After intestinal surgery, you may have loose watery stools for several days. If watery diarrhea lasts longer than 3 days, contact your surgeon. ?Pain medication (narcotics) can cause constipation. Increase the fiber in your diet with high-fiber foods if you are constipated. You can take an over the counter stool softener like Colace to avoid constipation.  Additional over the counter medications can also be used if Colace isn't sufficient (for example, Milk of Magnesia or Miralax). ? ?Pain ?The amount of pain is different for each person. Some people need only 1 to 3 doses of pain control medication,  while others need more. ?Take alternating doses of tylenol and ibuprofen around the clock for the first five days following surgery.  This will provide a baseline of pain control and help with inflammation.

## 2022-01-15 ENCOUNTER — Encounter (HOSPITAL_COMMUNITY): Payer: Self-pay | Admitting: Surgery

## 2022-01-15 LAB — SURGICAL PATHOLOGY

## 2022-01-15 MED ORDER — OXYCODONE-ACETAMINOPHEN 5-325 MG PO TABS
1.0000 | ORAL_TABLET | ORAL | 0 refills | Status: AC | PRN
Start: 2022-01-15 — End: 2023-01-15

## 2022-01-15 NOTE — Progress Notes (Signed)
Patient has received all her discharge instructions. Awaiting covering PA has been notified by staff RN to follow up on instructions of when she can return to work.  ? ?Orvan Seen SWOT RN ?

## 2022-01-15 NOTE — Discharge Summary (Signed)
?  Patient ID: ?Joyce [name] ?[id number] ?21 y.o. ?May 30, 2001 ? ?01/14/2022 ? ?Discharge date and time: 01/15/2022 ? ?Admitting Physician: Nickola Major Ravina Milner ? ?Discharge Physician: Nickola Major Yoltzin Barg ? ?Admission Diagnoses: Acute cholecystitis [K81.0] ?Acute calculous cholecystitis [K80.00] ?Patient Active Problem List  ? Diagnosis Date Noted  ? Acute calculous cholecystitis 01/14/2022  ? MDD (major depressive disorder), recurrent, severe, with psychosis ([place]) 04/06/2021  ? MDD (major depressive disorder), recurrent severe, without psychosis ([place]) 04/01/2021  ? Common peroneal neuropathy of left lower extremity 06/08/2018  ? ? ? ?Discharge Diagnoses: Cholecystitis ?Patient Active Problem List  ? Diagnosis Date Noted  ? Acute calculous cholecystitis 01/14/2022  ? MDD (major depressive disorder), recurrent, severe, with psychosis ([place]) 04/06/2021  ? MDD (major depressive disorder), recurrent severe, without psychosis ([place]) 04/01/2021  ? Common peroneal neuropathy of left lower extremity 06/08/2018  ? ? ?Operations: Procedure(s): ?LAPAROSCOPIC CHOLECYSTECTOMY ? ?Admission Condition: good ? ?Discharged Condition: good ? ?Indication for Admission: Cholecystitis ? ?Hospital Course: [name] underwent laparoscopic cholecystectomy on 01/15/22 and was discharged the following day ? ?Consults: None ? ?Significant Diagnostic Studies: Imaging ? ?Treatments: surgery: as above ? ?Disposition: Home ? ?Patient Instructions:  ?Allergies as of 01/15/2022   ?No Known Allergies ?  ? ?  ?Medication List  ?  ? ?TAKE these medications   ? ?acetaminophen 500 MG tablet ?Commonly known as: TYLENOL ?Take 1,000 mg by mouth every 6 (six) hours as needed for mild pain. ?  ?ARIPiprazole 10 MG tablet ?Commonly known as: Abilify ?Take 1 tablet (10 mg total) by mouth daily. ?  ?FLUoxetine 20 MG capsule ?Commonly known as: PROZAC ?Take 1 capsule (20 mg total) by mouth daily. ?  ?levothyroxine 100 MCG tablet ?Commonly known as: SYNTHROID ?Take 100 mcg by  mouth every morning. ?  ?oxyCODONE-acetaminophen 5-325 MG tablet ?Commonly known as: Percocet ?Take 1 tablet by mouth every 4 (four) hours as needed for severe pain. ?  ? ?  ? ? ?Activity: no heavy lifting for 4 weeks ?Diet: regular diet ?Wound Care: keep wound clean and dry - okay to shower and get incisions wet and soapy.  Just don't submerge incisions underwater. ? ?Follow-up:  With Dr. Thermon Leyland ? ?Signed: ?Nickola Major Gunda Maqueda ?General, Bariatric, & Minimally Invasive Surgery ?[facility], [place] ? ? ?01/15/2022, 9:36 AM ? ?

## 2022-01-15 NOTE — Anesthesia Postprocedure Evaluation (Signed)
Anesthesia Post Note ? ?Patient: Joyce [name] ? ?Procedure(s) Performed: LAPAROSCOPIC CHOLECYSTECTOMY (Abdomen) ? ?  ? ?Patient location during evaluation: PACU ?Anesthesia Type: General ?Level of consciousness: awake and alert ?Pain management: pain level controlled ?Vital Signs Assessment: post-procedure vital signs reviewed and stable ?Respiratory status: spontaneous breathing, nonlabored ventilation, respiratory function stable and patient connected to nasal cannula oxygen ?Cardiovascular status: blood pressure returned to baseline and stable ?Postop Assessment: no apparent nausea or vomiting ?Anesthetic complications: no ? ? ?No notable events documented. ? ?Last Vitals:  ?Vitals:  ? 01/14/22 2300 01/15/22 0807  ?BP: 106/66 105/62  ?Pulse: 74 73  ?Resp: 17 18  ?Temp: 36.4 ?C 36.8 ?C  ?SpO2: 98% 98%  ?  ?Last Pain:  ?Vitals:  ? 01/15/22 0846  ?TempSrc:   ?PainSc: 4   ? ? ?  ?  ?  ?  ?  ?  ? ?March Rummage Kasin Tonkinson ? ? ? ? ?

## 2022-02-25 ENCOUNTER — Other Ambulatory Visit: Payer: Self-pay | Admitting: Behavioral Health

## 2022-02-25 DIAGNOSIS — F411 Generalized anxiety disorder: Secondary | ICD-10-CM

## 2022-02-25 DIAGNOSIS — F332 Major depressive disorder, recurrent severe without psychotic features: Secondary | ICD-10-CM

## 2022-02-25 NOTE — Telephone Encounter (Signed)
Please call to schedule appt or see if she is still following with [place]. Last seen 12/22 with RTC in 3 weeks. Had no show and has had several no shows with Kayla.

## 2022-02-27 NOTE — Telephone Encounter (Signed)
Lm with Joyce [name]'s dad about scheduling appt. He states that she has went off her meds and they are trying to get her to come back in.

## 2022-09-10 ENCOUNTER — Encounter: Payer: Self-pay | Admitting: Behavioral Health

## 2022-09-10 ENCOUNTER — Ambulatory Visit (INDEPENDENT_AMBULATORY_CARE_PROVIDER_SITE_OTHER): Payer: 59 | Admitting: Behavioral Health

## 2022-09-10 DIAGNOSIS — F332 Major depressive disorder, recurrent severe without psychotic features: Secondary | ICD-10-CM

## 2022-09-10 DIAGNOSIS — F39 Unspecified mood [affective] disorder: Secondary | ICD-10-CM | POA: Diagnosis not present

## 2022-09-10 DIAGNOSIS — F411 Generalized anxiety disorder: Secondary | ICD-10-CM | POA: Diagnosis not present

## 2022-09-10 MED ORDER — ARIPIPRAZOLE 5 MG PO TABS: 5.0000 mg | ORAL_TABLET | Freq: Every day | ORAL | 1 refills | Status: DC

## 2022-09-10 MED ORDER — FLUOXETINE HCL 20 MG PO CAPS: 20.0000 mg | ORAL_CAPSULE | Freq: Every day | ORAL | 1 refills | Status: DC

## 2022-09-10 NOTE — Progress Notes (Signed)
[facility]  Patient ID: Joyce [name],  MRN: [id number]  PCP: Merri Brunette, MD  Date of Evaluation: 09/10/2022 Time spent:30 minutes  Chief Complaint:  Chief Complaint   Depression; Anxiety; Follow-up; Medication Refill; Medication Problem; Patient Education     HISTORY/CURRENT STATUS: HPI 21 year old african [place] female presents to this office for follow up. She is withdrawn again and speaking softly.  She is guarded. Not seen pt in almost one year.. She admits to being non compliant with medication and falling back in to depressive state when she stopped. She stopped medication several months ago. She agrees that she does very well when she is taking her medication consistently. She agrees to restart the medication.  She says that she is not currently suicidal and contracts for safety. Denies Mania, No psychosis, No  current SI/HI.   No previous psychiatric medication failures.    Individual Medical History/ Review of Systems: Changes? :No   Allergies: Patient has no known allergies.  Current Medications:  Current Outpatient Medications:    ARIPiprazole (ABILIFY) 5 MG tablet, Take 1 tablet (5 mg total) by mouth daily., Disp: 30 tablet, Rfl: 1   acetaminophen (TYLENOL) 500 MG tablet, Take 1,000 mg by mouth every 6 (six) hours as needed for mild pain., Disp: , Rfl:    ARIPiprazole (ABILIFY) 10 MG tablet, Take 1 tablet (10 mg total) by mouth daily., Disp: 30 tablet, Rfl: 2   FLUoxetine (PROZAC) 20 MG capsule, Take 1 capsule (20 mg total) by mouth daily., Disp: 30 capsule, Rfl: 1   levothyroxine (SYNTHROID) 100 MCG tablet, Take 100 mcg by mouth every morning., Disp: , Rfl:    oxyCODONE-acetaminophen (PERCOCET) 5-325 MG tablet, Take 1 tablet by mouth every 4 (four) hours as needed for severe pain., Disp: 15 tablet, Rfl: 0 Medication Side Effects: none  Family Medical/ Social History: Changes? No  MENTAL HEALTH EXAM:  There were no vitals taken for this  visit.There is no height or weight on file to calculate BMI.  General Appearance: Casual, Neat, and Well Groomed  Eye Contact:  Good  Speech:  Clear and Coherent  Volume:  Decreased  Mood:  Anxious, Depressed, and Dysphoric  Affect:  Depressed, Flat, and Anxious  Thought Process:  Coherent  Orientation:  Full (Time, Place, and Person)  Thought Content: Logical   Suicidal Thoughts:  No  Homicidal Thoughts:  No  Memory:  WNL  Judgement:  Good  Insight:  Good  Psychomotor Activity:  Normal  Concentration:  Concentration: Good  Recall:  Good  Fund of Knowledge: Good  Language: Good  Assets:  Desire for Improvement  ADL's:  Intact  Cognition: WNL  Prognosis:  Good    DIAGNOSES:    ICD-10-CM   1. Unspecified mood (affective) disorder (H[facility])  F39 ARIPiprazole (ABILIFY) 5 MG tablet    2. Generalized anxiety disorder  F41.1 ARIPiprazole (ABILIFY) 5 MG tablet    3. MDD (major depressive disorder), recurrent severe, without psychosis ([facility])  F33.2 ARIPiprazole (ABILIFY) 5 MG tablet    FLUoxetine (PROZAC) 20 MG capsule      Receiving Psychotherapy: No    RECOMMENDATIONS:  Patient has been non compliant with medication the last few months. She stops taking when she feels better.  Will follow up in 4 weeks to reassess  Will reinitiate Abilify 5 mg by mouth daily until next visit.   Continue Prozac  20 mg tab daily Will report any worsening symptoms or side effects promptly Provided emergency contact  information  Greater than 50% of  20 min face to face time with patient was spent on counseling and coordination of care. We discussed the importance of taking medication exactly as prescribed. She agreed that her severe depression returned when she stopped taking medication correctly. There has been no known incidents of psychosis since last visit.  We discussed long term personal goals that would assist in bringing emotional stability over time. Discussed her continued complicated family  dynamics and religious conflict between her parents.   Recommended she follow up with her PCP for check up. It has been almost one year. Check TSH next visit if pt has not visited PCP Reviewed [place], NP

## 2022-09-24 ENCOUNTER — Other Ambulatory Visit: Payer: Self-pay | Admitting: Behavioral Health

## 2022-09-24 DIAGNOSIS — F39 Unspecified mood [affective] disorder: Secondary | ICD-10-CM

## 2022-09-24 DIAGNOSIS — F332 Major depressive disorder, recurrent severe without psychotic features: Secondary | ICD-10-CM

## 2022-09-24 DIAGNOSIS — F411 Generalized anxiety disorder: Secondary | ICD-10-CM

## 2022-10-08 ENCOUNTER — Ambulatory Visit: Payer: 59 | Admitting: Behavioral Health

## 2022-10-08 ENCOUNTER — Encounter: Payer: Self-pay | Admitting: Behavioral Health

## 2022-10-08 VITALS — Wt 209.0 lb

## 2022-10-08 DIAGNOSIS — F411 Generalized anxiety disorder: Secondary | ICD-10-CM | POA: Diagnosis not present

## 2022-10-08 DIAGNOSIS — F332 Major depressive disorder, recurrent severe without psychotic features: Secondary | ICD-10-CM

## 2022-10-08 MED ORDER — ARIPIPRAZOLE 10 MG PO TABS: 10.0000 mg | ORAL_TABLET | Freq: Every day | ORAL | 1 refills | Status: DC

## 2022-10-08 MED ORDER — FLUOXETINE HCL 20 MG PO CAPS: 20.0000 mg | ORAL_CAPSULE | Freq: Every day | ORAL | 1 refills | Status: DC

## 2022-10-08 NOTE — Progress Notes (Signed)
[facility]  Patient ID: Joyce [name],  MRN: [id number]  PCP: Carol Ada, MD  Date of Evaluation: 10/08/2022 Time spent:30 minutes  Chief Complaint:  Chief Complaint   Depression; Anxiety; Follow-up; Patient Education; Medication Refill; Medication Problem     HISTORY/CURRENT STATUS: HPI  22 year old african [place] female presents to this office for follow up. She is a little more talkative this visit and not as flat. She expresses she does do better when she takes her medication. She is still very quiet with low volume. Says she has been taking Abilify everyday but has not started back the Prozac. She stopped medication several months ago. She agrees that she does very well when she is taking her medication consistently. She agrees to restart the medication.  She says that she is not currently suicidal and contracts for safety. Denies Mania, No psychosis, No  current SI/HI.   No previous psychiatric medication failures.    Individual Medical History/ Review of Systems: Changes? :No   Allergies: Patient has no known allergies.  Current Medications:  Current Outpatient Medications:    ARIPiprazole (ABILIFY) 10 MG tablet, Take 1 tablet (10 mg total) by mouth daily., Disp: 30 tablet, Rfl: 1   acetaminophen (TYLENOL) 500 MG tablet, Take 1,000 mg by mouth every 6 (six) hours as needed for mild pain., Disp: , Rfl:    ARIPiprazole (ABILIFY) 10 MG tablet, Take 1 tablet (10 mg total) by mouth daily., Disp: 30 tablet, Rfl: 2   ARIPiprazole (ABILIFY) 5 MG tablet, Take 1 tablet (5 mg total) by mouth daily., Disp: 30 tablet, Rfl: 1   FLUoxetine (PROZAC) 20 MG capsule, Take 1 capsule (20 mg total) by mouth daily., Disp: 30 capsule, Rfl: 1   levothyroxine (SYNTHROID) 100 MCG tablet, Take 100 mcg by mouth every morning., Disp: , Rfl:    oxyCODONE-acetaminophen (PERCOCET) 5-325 MG tablet, Take 1 tablet by mouth every 4 (four) hours as needed for severe pain., Disp: 15 tablet,  Rfl: 0 Medication Side Effects: none  Family Medical/ Social History: Changes? No  MENTAL HEALTH EXAM:  There were no vitals taken for this visit.There is no height or weight on file to calculate BMI.  General Appearance: Casual, Neat, and Well Groomed  Eye Contact:  Good  Speech:  Pressured  Volume:  Decreased  Mood:  Depressed and Dysphoric  Affect:  Depressed and Flat  Thought Process:  Coherent  Orientation:  Full (Time, Place, and Person)  Thought Content: Logical   Suicidal Thoughts:  No  Homicidal Thoughts:  No  Memory:  WNL  Judgement:  Good  Insight:  Good  Psychomotor Activity:  Normal  Concentration:  Concentration: Good  Recall:  Good  Fund of Knowledge: Fair  Language: Fair  Assets:  Desire for Improvement  ADL's:  Intact  Cognition: WNL  Prognosis:  Good    DIAGNOSES:    ICD-10-CM   1. MDD (major depressive disorder), recurrent severe, without psychosis ([place])  F33.2 FLUoxetine (PROZAC) 20 MG capsule    2. Generalized anxiety disorder  F41.1       Receiving Psychotherapy: No    RECOMMENDATIONS:   Pt states she has been compliant in restarting Abilify but has not started the Prozac yet. Will follow up in 4 weeks to reassess  To increase Abilify to 10 mg by mouth daily until next visit.   Start Prozac  20 mg tab daily Will report any worsening symptoms or side effects promptly Provided emergency contact information  Greater than 50% of  30  min face to face time with patient was spent on counseling and coordination of care. Reinforced again the importance of taking medication exactly as prescribed.   We discussed long term personal goals that would assist in bringing emotional stability over time. Discussed her continued complicated family dynamics and religious conflict between her parents.   Recommended she follow up with her PCP for check up. It has been almost one year.  She has requested me to fill out Staff Health Assessment Form for her new job  at [employer].  I have agreed to complete and it is my opinion that she would be safe working in that environment with no additional risk. However she must remain in treatment and be compliant with medications.   Labs next visit  Reviewed [place], NP

## 2022-10-15 ENCOUNTER — Telehealth: Payer: Self-pay | Admitting: Behavioral Health

## 2022-10-15 NOTE — Telephone Encounter (Signed)
Pt stated she has been taking prozac for about 5 days and she has been having nausea and headaches.This has prevented her from being able to eat and function at work.Please advise

## 2022-10-15 NOTE — Telephone Encounter (Signed)
Pt is taking Prozac, only on 10mg . Feels she is having side effects. [place], light headed, head aches and not feeling hungry and these are affecting her work. Please call.

## 2022-10-16 NOTE — Telephone Encounter (Signed)
Pt stated she can't eat due to the med taking away appetite and making her nauseous.She said she passed out for a minute yesterday and her parents told her to stop med anyway.

## 2022-11-05 ENCOUNTER — Ambulatory Visit (INDEPENDENT_AMBULATORY_CARE_PROVIDER_SITE_OTHER): Payer: 59 | Admitting: Behavioral Health

## 2022-11-05 ENCOUNTER — Encounter: Payer: Self-pay | Admitting: Behavioral Health

## 2022-11-05 DIAGNOSIS — F332 Major depressive disorder, recurrent severe without psychotic features: Secondary | ICD-10-CM

## 2022-11-05 DIAGNOSIS — F411 Generalized anxiety disorder: Secondary | ICD-10-CM

## 2022-11-05 DIAGNOSIS — F331 Major depressive disorder, recurrent, moderate: Secondary | ICD-10-CM | POA: Diagnosis not present

## 2022-11-05 MED ORDER — SERTRALINE HCL 50 MG PO TABS: ORAL_TABLET | ORAL | 1 refills | Status: DC

## 2022-11-05 MED ORDER — ARIPIPRAZOLE 10 MG PO TABS: 10.0000 mg | ORAL_TABLET | Freq: Every day | ORAL | 1 refills | Status: DC
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
## 2022-11-05 NOTE — Progress Notes (Signed)
[facility] Med Check  Patient ID: Joyce [name],  MRN: [id number]  PCP: Carol Ada, MD  Date of Evaluation: 11/05/2022 Time spent:30 minutes  Chief Complaint:  Chief Complaint   Anxiety; Depression; Medication Refill; Patient Education; Follow-up; Medication Problem     HISTORY/CURRENT STATUS: HPI 22 year old african [place] female presents to this office for follow up. She is a little more talkative this visit and not as flat. Says she feels 50 % better since restarting medication. She expresses she does do better when she takes her medication. She is still very quiet with low volume. Says she has been taking Abilify everyday. She was unable to keep taking the prozac because it made her dizzy and feel too sleepy. She agrees that she does very well when she is taking her medication consistently.  She says that she is not currently suicidal and contracts for safety. Denies Mania, No psychosis, No  current SI/HI.   No previous psychiatric medication failures.     Individual Medical History/ Review of Systems: Changes? :No   Allergies: Patient has no known allergies.  Current Medications:  Current Outpatient Medications:    sertraline (ZOLOFT) 50 MG tablet, Take 1/2 tablet 25 mg by mouth in the am after breakfast for 7 days, then take one whole tablet 50 mg total daily, Disp: 30 tablet, Rfl: 1   acetaminophen (TYLENOL) 500 MG tablet, Take 1,000 mg by mouth every 6 (six) hours as needed for mild pain., Disp: , Rfl:    ARIPiprazole (ABILIFY) 10 MG tablet, Take 1 tablet (10 mg total) by mouth daily., Disp: 30 tablet, Rfl: 2   ARIPiprazole (ABILIFY) 10 MG tablet, Take 1 tablet (10 mg total) by mouth daily., Disp: 30 tablet, Rfl: 1   ARIPiprazole (ABILIFY) 5 MG tablet, Take 1 tablet (5 mg total) by mouth daily., Disp: 30 tablet, Rfl: 1   levothyroxine (SYNTHROID) 100 MCG tablet, Take 100 mcg by mouth every morning., Disp: , Rfl:    oxyCODONE-acetaminophen (PERCOCET) 5-325 MG  tablet, Take 1 tablet by mouth every 4 (four) hours as needed for severe pain., Disp: 15 tablet, Rfl: 0 Medication Side Effects: none  Family Medical/ Social History: Changes? No  MENTAL HEALTH EXAM:  There were no vitals taken for this visit.There is no height or weight on file to calculate BMI.  General Appearance: Casual and Neat  Eye Contact:  Good  Speech:  Clear and Coherent  Volume:  Normal  Mood:  Anxious and Depressed  Affect:  Appropriate  Thought Process:  Coherent  Orientation:  Full (Time, Place, and Person)  Thought Content: Logical   Suicidal Thoughts:  No  Homicidal Thoughts:  No  Memory:  WNL  Judgement:  Good  Insight:  Good  Psychomotor Activity:  Normal  Concentration:  Concentration: Good  Recall:  Good  Fund of Knowledge: Good  Language: Good  Assets:  Desire for Improvement  ADL's:  Intact  Cognition: WNL  Prognosis:  Good    DIAGNOSES:    ICD-10-CM   1. Moderate episode of recurrent major depressive disorder (HCC)  F33.1 sertraline (ZOLOFT) 50 MG tablet    ARIPiprazole (ABILIFY) 10 MG tablet    2. Generalized anxiety disorder  F41.1 sertraline (ZOLOFT) 50 MG tablet    ARIPiprazole (ABILIFY) 10 MG tablet      Receiving Psychotherapy: No    RECOMMENDATIONS:  Greater than 50% of  30  min face to face time with patient was spent on counseling and coordination of care.  Reinforced again the importance of taking medication exactly as prescribed.   We discussed long term personal goals that would assist in bringing emotional stability over time. Discussed her continued complicated family dynamics and religious conflict between her parents.   Recommended she follow up with her PCP for check up. It has been almost one year.   She has requested me to fill out Staff Health Assessment Form for her new job at [employer].  I have agreed to complete and it is my opinion that she would be safe working in that environment with no additional risk.  However she must remain in treatment and be compliant with medications.   Will follow up in 4 weeks to reassess  To Continue Abilify to 10 mg by mouth daily until next visit.  Stopped Prozac two weeks ago To start Zoloft 25 mg daily for 7 days, then 50 mg daily  Will report any worsening symptoms or side effects promptly Provided emergency contact information  Reviewed [place], NP

## 2022-11-17 ENCOUNTER — Telehealth: Payer: Self-pay | Admitting: Behavioral Health

## 2022-11-17 NOTE — Telephone Encounter (Signed)
Pt called reporting started Sertraline 2 weeks ago and she is feeling worse. Very tired, not helping depression. Not feeling like herself. RTC [phone]  APT 3/21

## 2022-11-19 NOTE — Telephone Encounter (Signed)
Tried calling patient and unable to LV - asks for remote access code.

## 2022-11-20 NOTE — Telephone Encounter (Signed)
Left message with dad to ask patient to call the office.

## 2022-11-23 ENCOUNTER — Encounter: Payer: Self-pay | Admitting: Addiction (Substance Use Disorder)

## 2022-11-23 NOTE — Telephone Encounter (Signed)
Tried to reach patient again and got dad. He said he doesn't know why she isn't calling us, but details to stop the sertraline and continue on Abilify given to dad.  He asked if she should have the medication available. I told him an Rx was sent 2/22 with 1 refill, so she should have the medication available to her at the pharmacy if she didn't still have any at home.

## 2022-11-24 ENCOUNTER — Encounter: Payer: Self-pay | Admitting: Addiction (Substance Use Disorder)

## 2022-11-29 ENCOUNTER — Other Ambulatory Visit: Payer: Self-pay | Admitting: Behavioral Health

## 2022-11-29 DIAGNOSIS — F331 Major depressive disorder, recurrent, moderate: Secondary | ICD-10-CM

## 2022-11-29 DIAGNOSIS — F411 Generalized anxiety disorder: Secondary | ICD-10-CM

## 2022-12-03 ENCOUNTER — Ambulatory Visit (INDEPENDENT_AMBULATORY_CARE_PROVIDER_SITE_OTHER): Payer: 59 | Admitting: Behavioral Health

## 2022-12-03 ENCOUNTER — Encounter: Payer: Self-pay | Admitting: Behavioral Health

## 2022-12-03 DIAGNOSIS — F332 Major depressive disorder, recurrent severe without psychotic features: Secondary | ICD-10-CM

## 2022-12-03 DIAGNOSIS — F411 Generalized anxiety disorder: Secondary | ICD-10-CM | POA: Diagnosis not present

## 2022-12-03 DIAGNOSIS — Z79899 Other long term (current) drug therapy: Secondary | ICD-10-CM | POA: Diagnosis not present

## 2022-12-03 NOTE — Progress Notes (Signed)
[facility] Med Check  Patient ID: Joyce [name],  MRN: [id number]  PCP: Carol Ada, MD  Date of Evaluation: 12/03/2022 Time spent:30 minutes  Chief Complaint:  Chief Complaint   Anxiety; Depression; Follow-up; Medication Problem; Patient Education     HISTORY/CURRENT STATUS: HPI 22 year old african [place] female presents to this office for follow up. She is a little more talkative this visit and not as flat. Says she feels 50 % better since restarting medication.  Still not where she would like to be and requesting dosage increase this visit. She expresses she does do better when she takes her medication. More talkative this visit and pleasant. Volume and eye contact has improved.  She agrees that she does very well when she is taking her medication consistently.  She says that she is not currently suicidal and contracts for safety. Denies Mania, No psychosis, No  current SI/HI.   No previous psychiatric medication failures.       Individual Medical History/ Review of Systems: Changes? :No   Allergies: Patient has no known allergies.  Current Medications:  Current Outpatient Medications:    acetaminophen (TYLENOL) 500 MG tablet, Take 1,000 mg by mouth every 6 (six) hours as needed for mild pain., Disp: , Rfl:    ARIPiprazole (ABILIFY) 10 MG tablet, Take 1 tablet (10 mg total) by mouth daily., Disp: 30 tablet, Rfl: 2   ARIPiprazole (ABILIFY) 10 MG tablet, Take 1 tablet (10 mg total) by mouth daily., Disp: 30 tablet, Rfl: 1   ARIPiprazole (ABILIFY) 5 MG tablet, Take 1 tablet (5 mg total) by mouth daily., Disp: 30 tablet, Rfl: 1   levothyroxine (SYNTHROID) 100 MCG tablet, Take 100 mcg by mouth every morning., Disp: , Rfl:    oxyCODONE-acetaminophen (PERCOCET) 5-325 MG tablet, Take 1 tablet by mouth every 4 (four) hours as needed for severe pain., Disp: 15 tablet, Rfl: 0   sertraline (ZOLOFT) 50 MG tablet, Take 1/2 tablet 25 mg by mouth in the am after breakfast for 7  days, then take one whole tablet 50 mg total daily, Disp: 30 tablet, Rfl: 1 Medication Side Effects: none  Family Medical/ Social History: Changes? No  MENTAL HEALTH EXAM:  There were no vitals taken for this visit.There is no height or weight on file to calculate BMI.  General Appearance: Casual, Neat, and Well Groomed  Eye Contact:  Good  Speech:  Clear and Coherent  Volume:  Normal  Mood:  Anxious, Depressed, and Dysphoric  Affect:  Appropriate  Thought Process:  Coherent  Orientation:  Full (Time, Place, and Person)  Thought Content: Logical   Suicidal Thoughts:  No  Homicidal Thoughts:  No  Memory:  WNL  Judgement:  Fair  Insight:  Fair  Psychomotor Activity:  Normal  Concentration:  Concentration: Fair  Recall:  Good  Fund of Knowledge: Fair  Language: Good  Assets:  Desire for Improvement  ADL's:  Intact  Cognition: WNL  Prognosis:  Good    DIAGNOSES:    ICD-10-CM   1. MDD (major depressive disorder), recurrent severe, without psychosis ([place])  F33.2     2. Generalized anxiety disorder  F41.1     3. High risk medication use  Z79.899 TSH    CBC with Differential/Platelet    Comprehensive metabolic panel    Hepatic function panel    Hemoglobin A1c    Lipid panel      Receiving Psychotherapy: No    RECOMMENDATIONS:   Greater than 50% of  30  min face to face time with patient was spent on counseling and coordination of care. Reinforced again the importance of taking medication exactly as prescribed.   We discussed long term personal goals that would assist in bringing emotional stability over time. Discussed her continued complicated family dynamics and religious conflict between her parents.   Recommended she follow up with her PCP for check up. It has been almost one year.   We ordered labs today. She acknowledges previous thyroid problems.    We completed Gene[employer] packet today and will review results next visit.   Will follow up in 3 weeks to  reassess  Increase Abilify to 15  mg by mouth daily until next visit.  Will consider AD after obtaining GeneSight results Will report any worsening symptoms or side effects promptly Provided emergency contact information  Reviewed [place], NP

## 2022-12-07 ENCOUNTER — Encounter: Payer: Self-pay | Admitting: Behavioral Health

## 2022-12-07 ENCOUNTER — Ambulatory Visit (INDEPENDENT_AMBULATORY_CARE_PROVIDER_SITE_OTHER): Payer: 59 | Admitting: Behavioral Health

## 2022-12-07 DIAGNOSIS — F331 Major depressive disorder, recurrent, moderate: Secondary | ICD-10-CM | POA: Diagnosis not present

## 2022-12-07 DIAGNOSIS — F411 Generalized anxiety disorder: Secondary | ICD-10-CM

## 2022-12-07 NOTE — Progress Notes (Signed)
[facility] Counselor/Therapist Progress Note  Patient ID: Joyce [name], MRN: [id number],    Date: 12/07/2022  Time Spent: 45 minutes  Treatment Type: Individual Therapy  Reported Symptoms: Depression and anxiety   Mental Status Exam:  Appearance:   Casual     Behavior:  Appropriate and Sharing  Motor:  Normal  Speech/Language:   Clear and Coherent  Affect:  Depressed and Flat  Mood:  depressed  Thought process:  normal  Thought content:    WNL  Sensory/Perceptual disturbances:    WNL  Orientation:  oriented to person  Attention:  Good  Concentration:  Good  Memory:  WNL  Fund of knowledge:   Good  Insight:    Good  Judgment:   Good  Impulse Control:  Good   Risk Assessment: Danger to Self:  No Self-injurious Behavior: No Danger to Others: No Duty to Warn:no Physical Aggression / Violence:No  Access to Firearms a concern: No  Gang Involvement:No   Subjective:   The patient reports doing "About the same, just tired a lot". She reports to lack motivation, feels worthless, and has low energy.  She has discontinued Zoloft due to experiencing adverse reactions. She reports adhering to her prescribed medication regimen of Abilify. She states belief that Abilify has been helpful. She rated her mental health symptoms on a scale of 0 to 10 with 10 being severe at a 6 today for depression and rated anxiety at a 6. She reports experiencing anxiety due to "About to go to my job today and its probably gonna spike". 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 She identified triggers of stress for work as the children not listening and the classroom lacking structure. The patient reports concern with not receiving support from other staff. The patient expressed concerns of feeling frustrated with lack of  communication at work. She reports she is experiencing more depression than anxiety today.   She is utilizing deep breathing technique and positive self talk as a coping strategy. She identified positive self talk as "I just need to get thru the week and things will be okay, but sometimes when I get there and see them out of control I am like I can't do this". The patient identified the pros and cons of her work occupation. She denied experiencing any crying spells. She reports attempting to schedule activities to give herself something to look forward to in order to help her manage her mental health symptoms. In addition, she reports to attempt to draw and write creative stories as a hobby. The patient reports she has informed her employer of her mental health needs. 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 She denied SI/HI, AH/VH. She was informed this therapist time is limited with [facility]. The patient requested to receive counseling referral information of local counseling agencies in the area.  Counselor conducted check in.Counselor utilized reflective listening and validated the patient. Counselor encouraged the patient to continue to utilize positive self talk and deep breathing technique. Counselor discussed implementing a mental health wellness action plan with management staff at her place of employment to assist her due to her report that they  are not able to make adjustments to accommodate her request of changing classrooms. Counselor provided support to the patient with problem solving and discussed the mental health wellness action plan, regarding her anxiety provoking situation, to consist of identifying what helps the patient stay mentally healthy at work, what can administration do to proactively  support her mental health, identify situations that trigger poor mental health and how that may impact her work, warning signs of poor mental health and what support could be implemented to minimize triggers. Counselor questioned the patients needs and discussed mental health symptoms. Counselor requested for the patient to rate her symptoms. Counselor questioned pros and cons of the patients place of employment. Counselor informed the patient her time is limited with [facility] and provided her a referral list of counseling agencies in the area due to her request to continue receiving therapy.   Interventions: Solution-Oriented/Positive Psychology  Diagnosis:   ICD-10-CM   1. Moderate episode of recurrent major depressive disorder (HCC)  F33.1     2. Generalized anxiety disorder  F41.1       Plan:   The patient reports "I guess I want to be better, because I keep getting told that I need to get help. I can't keep going thru the same cycle of going over and over again of just breaking down when things get tough".     1) Long Term Goal: Develop strategies to reduce symptoms.     Short Term Goal: Reduce anxiety and improve coping skills.     Objective: Manage panic episodes whenever she feeling stressed or overwhelmed.     Objective: Recognize and plan for anxiety provoking situations such as work environment.     Objective: Develop strategies for thought distraction when fixating on the future.    2) Long Term Goal: Alleviate depressive symptoms and return to previous level of effective functioning.      Short Term Goal: Improve overall mood.      Objective: Get through a day/week without a crying spell.     Objective: Call the crisis hotline if having suicidal thoughts.     Objective: Develop strategies for thought distraction when ruminating on the past.      Objective: Report feeling happy/better overall mood.         Jannifer Hick,  [facility]

## 2022-12-24 ENCOUNTER — Encounter: Payer: Self-pay | Admitting: Behavioral Health

## 2022-12-24 ENCOUNTER — Ambulatory Visit (INDEPENDENT_AMBULATORY_CARE_PROVIDER_SITE_OTHER): Payer: 59 | Admitting: Behavioral Health

## 2022-12-24 DIAGNOSIS — F331 Major depressive disorder, recurrent, moderate: Secondary | ICD-10-CM

## 2022-12-24 DIAGNOSIS — F39 Unspecified mood [affective] disorder: Secondary | ICD-10-CM | POA: Diagnosis not present

## 2022-12-24 DIAGNOSIS — F411 Generalized anxiety disorder: Secondary | ICD-10-CM | POA: Diagnosis not present

## 2022-12-24 MED ORDER — AUVELITY 45-105 MG PO TBCR
EXTENDED_RELEASE_TABLET | ORAL | 1 refills | Status: DC
Start: 2022-12-24 — End: 2023-01-21

## 2022-12-24 NOTE — Progress Notes (Signed)
[facility]  Patient ID: Joyce [name],  MRN: [id number]  PCP: Merri Brunette, MD  Date of Evaluation: 12/24/2022 Time spent:30 minutes  Chief Complaint:  Chief Complaint   Anxiety; Depression; Follow-up; Medication Refill; Patient Education; Medication Problem     HISTORY/CURRENT STATUS: HPI  22 year old african [place] female presents to this office for follow up. She is a little more talkative this visit and not as flat but still feeling unacceptable level of depression. She is open to trying a new anti depressant since she was not able to tolerate Zoloft.  Report anxiety at 2/10 and depression at 5/10.   She says that she is not currently suicidal and contracts for safety. Denies Mania, No psychosis, No  current SI/HI.   No previous psychiatric medication failures.   Individual Medical History/ Review of Systems: Changes? :No   Allergies: Patient has no known allergies.  Current Medications:  Current Outpatient Medications:    Dextromethorphan-buPROPion ER (AUVELITY) 45-105 MG TBCR, Take one tablet by mouth twice daily at least 8 hours in between doses, Disp: 60 tablet, Rfl: 1   acetaminophen (TYLENOL) 500 MG tablet, Take 1,000 mg by mouth every 6 (six) hours as needed for mild pain., Disp: , Rfl:    ARIPiprazole (ABILIFY) 10 MG tablet, Take 1 tablet (10 mg total) by mouth daily., Disp: 30 tablet, Rfl: 2   ARIPiprazole (ABILIFY) 10 MG tablet, Take 1 tablet (10 mg total) by mouth daily., Disp: 30 tablet, Rfl: 1   ARIPiprazole (ABILIFY) 5 MG tablet, Take 1 tablet (5 mg total) by mouth daily., Disp: 30 tablet, Rfl: 1   levothyroxine (SYNTHROID) 100 MCG tablet, Take 100 mcg by mouth every morning., Disp: , Rfl:    oxyCODONE-acetaminophen (PERCOCET) 5-325 MG tablet, Take 1 tablet by mouth every 4 (four) hours as needed for severe pain., Disp: 15 tablet, Rfl: 0   sertraline (ZOLOFT) 50 MG tablet, Take 1/2 tablet 25 mg by mouth in the am after breakfast for 7 days,  then take one whole tablet 50 mg total daily, Disp: 30 tablet, Rfl: 1 Medication Side Effects: none  Family Medical/ Social History: Changes? No  MENTAL HEALTH EXAM:  There were no vitals taken for this visit.There is no height or weight on file to calculate BMI.  General Appearance: Casual, Neat, and Well Groomed  Eye Contact:  Good  Speech:  Clear and Coherent  Volume:  Normal  Mood:  Anxious, Depressed, and Dysphoric  Affect:  Depressed, Flat, and Anxious  Thought Process:  Coherent  Orientation:  Full (Time, Place, and Person)  Thought Content: Logical   Suicidal Thoughts:  No  Homicidal Thoughts:  No  Memory:  WNL  Judgement:  Good  Insight:  Good  Psychomotor Activity:  Normal  Concentration:  Concentration: Good  Recall:  Good  Fund of Knowledge: Good  Language: Good  Assets:  Desire for Improvement  ADL's:  Intact  Cognition: WNL  Prognosis:  Good    DIAGNOSES:    ICD-10-CM   1. Generalized anxiety disorder  F41.1     2. Major depressive disorder, recurrent episode, moderate  F33.1 Dextromethorphan-buPROPion ER (AUVELITY) 45-105 MG TBCR    3. Unspecified mood (affective) disorder  F39       Receiving Psychotherapy: no     RECOMMENDATIONS:   Greater than 50% of  30  min face to face time with patient was spent on counseling and coordination of care. Reinforced again the importance of taking medication exactly  as prescribed. She acknowledged that she failed to complete ordered labs last visit. I educated her on importance of ruling out any physiological problems.  she noted previous thyroid problems while under care of PCP.   To start Auvelity 45-105 for 3 days, then 90-210 until next  visit. Will follow up in 4 weeks to reassess  Continue Abilify to 15  mg by mouth daily until next visit.  Will report any worsening symptoms or side effects promptly Provided emergency contact information  Reviewed PDMP      Joan Flores, NP

## 2023-01-07 ENCOUNTER — Ambulatory Visit: Payer: 59 | Admitting: Behavioral Health

## 2023-01-16 LAB — HEPATIC FUNCTION PANEL
AG Ratio: 1.2 (calc) (ref 1.0–2.5)
ALT: 11 U/L (ref 6–29)
AST: 18 U/L (ref 10–30)
Albumin: 3.9 g/dL (ref 3.6–5.1)
Alkaline phosphatase (APISO): 93 U/L (ref 31–125)
Bilirubin, Direct: 0.1 mg/dL (ref 0.0–0.2)
Globulin: 3.3 g/dL (calc) (ref 1.9–3.7)
Indirect Bilirubin: 0.1 mg/dL (calc) — ABNORMAL LOW (ref 0.2–1.2)
Total Bilirubin: 0.2 mg/dL (ref 0.2–1.2)
Total Protein: 7.2 g/dL (ref 6.1–8.1)

## 2023-01-16 LAB — LIPID PANEL
Cholesterol: 100 mg/dL (ref <200)
HDL: 50 mg/dL (ref >=50)
LDL Cholesterol (Calc): 37 mg/dL (calc)
Non-HDL Cholesterol (Calc): 50 mg/dL (calc) (ref <130)
Total CHOL/HDL Ratio: 2 (calc) (ref <5.0)
Triglycerides: 53 mg/dL (ref <150)

## 2023-01-16 LAB — COMPREHENSIVE METABOLIC PANEL
AG Ratio: 1.2 (calc) (ref 1.0–2.5)
ALT: 11 U/L (ref 6–29)
AST: 18 U/L (ref 10–30)
Albumin: 3.9 g/dL (ref 3.6–5.1)
Alkaline phosphatase (APISO): 93 U/L (ref 31–125)
BUN: 10 mg/dL (ref 7–25)
CO2: 25 mmol/L (ref 20–32)
Calcium: 9.2 mg/dL (ref 8.6–10.2)
Chloride: 106 mmol/L (ref 98–110)
Creat: 0.84 mg/dL (ref 0.50–0.96)
Globulin: 3.3 g/dL (calc) (ref 1.9–3.7)
Glucose, Bld: 74 mg/dL (ref 65–139)
Potassium: 4.3 mmol/L (ref 3.5–5.3)
Sodium: 139 mmol/L (ref 135–146)
Total Bilirubin: 0.2 mg/dL (ref 0.2–1.2)
Total Protein: 7.2 g/dL (ref 6.1–8.1)

## 2023-01-16 LAB — CBC WITH DIFFERENTIAL/PLATELET
Absolute Monocytes: 511 cells/uL (ref 200–950)
Basophils Absolute: 50 cells/uL (ref 0–200)
Basophils Relative: 0.7 %
Eosinophils Absolute: 79 cells/uL (ref 15–500)
Eosinophils Relative: 1.1 %
HCT: 41.1 % (ref 35.0–45.0)
Hemoglobin: 12.5 g/dL (ref 11.7–15.5)
Lymphs Abs: 2045 cells/uL (ref 850–3900)
MCH: 26.4 pg — ABNORMAL LOW (ref 27.0–33.0)
MCHC: 30.4 g/dL — ABNORMAL LOW (ref 32.0–36.0)
MCV: 86.7 fL (ref 80.0–100.0)
MPV: 11.7 fL (ref 7.5–12.5)
Monocytes Relative: 7.1 %
Neutro Abs: 4514 cells/uL (ref 1500–7800)
Neutrophils Relative %: 62.7 %
Platelets: 257 10*3/uL (ref 140–400)
RBC: 4.74 10*6/uL (ref 3.80–5.10)
RDW: 14.6 % (ref 11.0–15.0)
Total Lymphocyte: 28.4 %
WBC: 7.2 10*3/uL (ref 3.8–10.8)

## 2023-01-16 LAB — HEMOGLOBIN A1C
Hgb A1c MFr Bld: 5.7 % of total Hgb — ABNORMAL HIGH (ref <5.7)
Mean Plasma Glucose: 117 mg/dL
eAG (mmol/L): 6.5 mmol/L

## 2023-01-16 LAB — TSH: TSH: 0.17 mIU/L — ABNORMAL LOW

## 2023-01-21 ENCOUNTER — Encounter: Payer: Self-pay | Admitting: Behavioral Health

## 2023-01-21 ENCOUNTER — Ambulatory Visit (INDEPENDENT_AMBULATORY_CARE_PROVIDER_SITE_OTHER): Payer: 59 | Admitting: Behavioral Health

## 2023-01-21 DIAGNOSIS — F331 Major depressive disorder, recurrent, moderate: Secondary | ICD-10-CM

## 2023-01-21 DIAGNOSIS — F411 Generalized anxiety disorder: Secondary | ICD-10-CM | POA: Diagnosis not present

## 2023-01-21 MED ORDER — AUVELITY 45-105 MG PO TBCR
EXTENDED_RELEASE_TABLET | ORAL | 1 refills | Status: DC
Start: 2023-01-21 — End: 2023-04-12

## 2023-01-21 MED ORDER — ARIPIPRAZOLE 10 MG PO TABS
10.0000 mg | ORAL_TABLET | Freq: Every day | ORAL | 1 refills | Status: DC
Start: 2023-01-21 — End: 2023-05-10

## 2023-01-21 NOTE — Progress Notes (Signed)
[facility]  Patient ID: Joyce [name],  MRN: [id number]  PCP: Merri Brunette, MD  Date of Evaluation: 01/21/2023 Time spent:30 minutes  Chief Complaint:  Chief Complaint   Depression; Anxiety; Follow-up; Patient Education     HISTORY/CURRENT STATUS: HPI  22 year old african [place] female presents to this office for follow up. She is a little more talkative this visit and not as flat but still feeling unacceptable level of depression. She is open to trying a new anti depressant since she was not able to tolerate Zoloft.  Report anxiety at 2/10 and depression at 5/10.   She says that she is not currently suicidal and contracts for safety. Denies Mania, No psychosis, No  current SI/HI.   No previous psychiatric medication failures.   Individual Medical History/ Review of Systems: Changes? :No   Allergies: Patient has no known allergies.  Current Medications:  Current Outpatient Medications:    acetaminophen (TYLENOL) 500 MG tablet, Take 1,000 mg by mouth every 6 (six) hours as needed for mild pain., Disp: , Rfl:    ARIPiprazole (ABILIFY) 10 MG tablet, Take 1 tablet (10 mg total) by mouth daily., Disp: 30 tablet, Rfl: 2   ARIPiprazole (ABILIFY) 10 MG tablet, Take 1 tablet (10 mg total) by mouth daily., Disp: 30 tablet, Rfl: 1   ARIPiprazole (ABILIFY) 5 MG tablet, Take 1 tablet (5 mg total) by mouth daily., Disp: 30 tablet, Rfl: 1   Dextromethorphan-buPROPion ER (AUVELITY) 45-105 MG TBCR, Take one tablet by mouth twice daily at least 8 hours in between doses, Disp: 60 tablet, Rfl: 1   levothyroxine (SYNTHROID) 100 MCG tablet, Take 100 mcg by mouth every morning., Disp: , Rfl:    sertraline (ZOLOFT) 50 MG tablet, Take 1/2 tablet 25 mg by mouth in the am after breakfast for 7 days, then take one whole tablet 50 mg total daily, Disp: 30 tablet, Rfl: 1 Medication Side Effects: none  Family Medical/ Social History: Changes?   MENTAL HEALTH EXAM:  There were no vitals  taken for this visit.There is no height or weight on file to calculate BMI.  General Appearance: Casual, Neat, and Well Groomed  Eye Contact:  Good  Speech:  Clear and Coherent  Volume:  Normal  Mood:  Anxious, Depressed, and Dysphoric  Affect:  Appropriate  Thought Process:  Coherent  Orientation:  Full (Time, Place, and Person)  Thought Content: Logical   Suicidal Thoughts:  No  Homicidal Thoughts:  No  Memory:  WNL  Judgement:  Good  Insight:  Good  Psychomotor Activity:  Normal  Concentration:  Concentration: Good  Recall:  Good  Fund of Knowledge: Good  Language: Good  Assets:  Desire for Improvement  ADL's:  Intact  Cognition: WNL  Prognosis:  Good    DIAGNOSES: No diagnosis found.  Receiving Psychotherapy: No    RECOMMENDATIONS:   Greater than 50% of 30 min face to face time with patient was spent on counseling and coordination of care. Reinforced again the importance of taking medication exactly as prescribed. She completed labs and I reviewed with her today. TSH was low at 0.17. I advised her to schedule with PCP promptly. Her A1C was 5.7.   Continue Auvelity  90-210 daily Will follow up in 8 weeks to reassess  Continue Abilify to 10  mg by mouth daily until next visit.  Will report any worsening symptoms or side effects promptly Provided emergency contact information  Discussed potential metabolic side effects associated with atypical  antipsychotics, as well as potential risk for movement side effects. Advised pt to contact office if movement side effects occur.   Reviewed PDMP   Joan Flores, NP

## 2023-02-09 ENCOUNTER — Ambulatory Visit: Payer: 59 | Admitting: Behavioral Health

## 2023-02-18 ENCOUNTER — Encounter: Payer: Self-pay | Admitting: Behavioral Health

## 2023-02-18 ENCOUNTER — Ambulatory Visit (INDEPENDENT_AMBULATORY_CARE_PROVIDER_SITE_OTHER): Payer: 59 | Admitting: Behavioral Health

## 2023-02-18 DIAGNOSIS — F411 Generalized anxiety disorder: Secondary | ICD-10-CM

## 2023-02-18 DIAGNOSIS — F330 Major depressive disorder, recurrent, mild: Secondary | ICD-10-CM

## 2023-02-18 DIAGNOSIS — F39 Unspecified mood [affective] disorder: Secondary | ICD-10-CM | POA: Diagnosis not present

## 2023-02-18 NOTE — Progress Notes (Signed)
[facility]  Patient ID: RAMON RIDLING,  MRN: [id number]  PCP: Merri Brunette, MD  Date of Evaluation: 02/18/2023 Time spent:30 minutes  Chief Complaint:  Chief Complaint   Anxiety; Depression; Follow-up; Patient Education     HISTORY/CURRENT STATUS: HPI  22 year old african [place] female presents to this office for follow up. She is a little more talkative this visit and not as flat. Says her moods have been a little better. She hasn't heard back from PCP yet about her TSH levels. She says she will call today.  Auvelity is helping and she is happy with her medication right now.   Report anxiety at 2/10 and depression at 4/10.   She says that she is not currently suicidal and contracts for safety. Denies Mania, No psychosis, No  current SI/HI.   No previous psychiatric medication failures.    Individual Medical History/ Review of Systems: Changes? :No   Allergies: Patient has no known allergies.  Current Medications:  Current Outpatient Medications:    acetaminophen (TYLENOL) 500 MG tablet, Take 1,000 mg by mouth every 6 (six) hours as needed for mild pain., Disp: , Rfl:    ARIPiprazole (ABILIFY) 10 MG tablet, Take 1 tablet (10 mg total) by mouth daily., Disp: 30 tablet, Rfl: 2   ARIPiprazole (ABILIFY) 10 MG tablet, Take 1 tablet (10 mg total) by mouth daily., Disp: 30 tablet, Rfl: 1   ARIPiprazole (ABILIFY) 5 MG tablet, Take 1 tablet (5 mg total) by mouth daily., Disp: 30 tablet, Rfl: 1   Dextromethorphan-buPROPion ER (AUVELITY) 45-105 MG TBCR, Take one tablet by mouth twice daily at least 8 hours in between doses, Disp: 60 tablet, Rfl: 1   levothyroxine (SYNTHROID) 100 MCG tablet, Take 100 mcg by mouth every morning., Disp: , Rfl:  Medication Side Effects: none  Family Medical/ Social History: Changes? No  MENTAL HEALTH EXAM:  There were no vitals taken for this visit.There is no height or weight on file to calculate BMI.  General Appearance: Casual, Neat,  and Well Groomed  Eye Contact:  Good  Speech:  Clear and Coherent  Volume:  Normal  Mood:  Anxious, Depressed, and Dysphoric  Affect:  Depressed  Thought Process:  Coherent  Orientation:  Full (Time, Place, and Person)  Thought Content: Logical   Suicidal Thoughts:  No  Homicidal Thoughts:  No  Memory:  WNL  Judgement:  Good  Insight:  Good  Psychomotor Activity:  Normal  Concentration:  Concentration: Good  Recall:  Good  Fund of Knowledge: Good  Language: Good  Assets:  Desire for Improvement  ADL's:  Intact  Cognition: WNL  Prognosis:  Good    DIAGNOSES: No diagnosis found.  Receiving Psychotherapy: No    RECOMMENDATIONS:   Greater than 50% of 30 min face to face time with patient was spent on counseling and coordination of care. Reinforced again the importance of taking medication exactly as prescribed. Follow up with her about TSH was low at 0.17. Said she followed up with PCP but has not addressed her TSH yet. Says she needs to call them back.   Continue Auvelity  90-210 daily Continue Abilify to 10  mg by mouth daily until next visit.  To follow up in 8 weeks to reassess.  Will report any worsening symptoms or side effects promptly Provided emergency contact information  Discussed potential metabolic side effects associated with atypical antipsychotics, as well as potential risk for movement side effects. Advised pt to contact office if  movement side effects occur.   Reviewed PDMP    Joan Flores, NP

## 2023-04-12 ENCOUNTER — Other Ambulatory Visit: Payer: Self-pay | Admitting: Behavioral Health

## 2023-04-12 DIAGNOSIS — F331 Major depressive disorder, recurrent, moderate: Secondary | ICD-10-CM

## 2023-04-20 ENCOUNTER — Ambulatory Visit: Payer: 59 | Admitting: Behavioral Health

## 2023-05-10 ENCOUNTER — Ambulatory Visit: Payer: 59 | Admitting: Behavioral Health

## 2023-05-10 ENCOUNTER — Encounter: Payer: Self-pay | Admitting: Behavioral Health

## 2023-05-10 ENCOUNTER — Telehealth: Payer: Self-pay

## 2023-05-10 DIAGNOSIS — F331 Major depressive disorder, recurrent, moderate: Secondary | ICD-10-CM | POA: Diagnosis not present

## 2023-05-10 DIAGNOSIS — F411 Generalized anxiety disorder: Secondary | ICD-10-CM

## 2023-05-10 MED ORDER — ARIPIPRAZOLE 10 MG PO TABS
10.0000 mg | ORAL_TABLET | Freq: Every day | ORAL | 1 refills | Status: AC
Start: 2023-05-10 — End: ?

## 2023-05-10 NOTE — Progress Notes (Signed)
[facility]  Patient ID: Joyce [name],  MRN: [id number]  PCP: Merri Brunette, MD  Date of Evaluation: 05/10/2023 Time spent:30 minutes  Chief Complaint:  Chief Complaint   Depression; Anxiety; Follow-up; Patient Education; Medication Refill; Stress     HISTORY/CURRENT STATUS: HPI  22 year old african [place] female presents to this office for follow up. She is a little more talkative this visit and not as flat. Says her moods have been a little better. She stopped taking her medication two weeks ago. Says she ran out.  She is flat again.  Report anxiety at 2/10 and depression at 4/10.   She says that she is not currently suicidal and contracts for safety. Denies Mania, No psychosis, No  current SI/HI.   No previous psychiatric medication failures.       Individual Medical History/ Review of Systems: Changes? :No   Allergies: Patient has no known allergies.  Current Medications:  Current Outpatient Medications:    acetaminophen (TYLENOL) 500 MG tablet, Take 1,000 mg by mouth every 6 (six) hours as needed for mild pain., Disp: , Rfl:    ARIPiprazole (ABILIFY) 10 MG tablet, Take 1 tablet (10 mg total) by mouth daily., Disp: 30 tablet, Rfl: 2   ARIPiprazole (ABILIFY) 10 MG tablet, Take 1 tablet (10 mg total) by mouth daily., Disp: 30 tablet, Rfl: 1   ARIPiprazole (ABILIFY) 5 MG tablet, Take 1 tablet (5 mg total) by mouth daily., Disp: 30 tablet, Rfl: 1   AUVELITY 45-105 MG TBCR, TAKE ONE TABLET BY MOUTH TWICE DAILY AT LEAST 8 HOURS IN BETWEEN DOSES, Disp: 60 tablet, Rfl: 1   levothyroxine (SYNTHROID) 100 MCG tablet, Take 100 mcg by mouth every morning., Disp: , Rfl:  Medication Side Effects: none  Family Medical/ Social History: Changes? No  MENTAL HEALTH EXAM:  There were no vitals taken for this visit.There is no height or weight on file to calculate BMI.  General Appearance: Casual and Neat  Eye Contact:  Good  Speech:  Clear and Coherent  Volume:   Normal  Mood:  Anxious, Depressed, and Dysphoric  Affect:  Congruent, Depressed, Flat, and Anxious  Thought Process:  Coherent  Orientation:  Full (Time, Place, and Person)  Thought Content: Logical   Suicidal Thoughts:  No  Homicidal Thoughts:  No  Memory:  WNL  Judgement:  Good  Insight:  Good  Psychomotor Activity:  Normal  Concentration:  Concentration: Good  Recall:  Good  Fund of Knowledge: Good  Language: Good  Assets:  Desire for Improvement  ADL's:  Intact  Cognition: WNL  Prognosis:  Good    DIAGNOSES:    ICD-10-CM   1. Generalized anxiety disorder  F41.1 ARIPiprazole (ABILIFY) 10 MG tablet    2. Moderate episode of recurrent major depressive disorder (HCC)  F33.1 ARIPiprazole (ABILIFY) 10 MG tablet      Receiving Psychotherapy:   RECOMMENDATIONS:   Greater than 50% of 30 min face to face time with patient was spent on counseling and coordination of care. Reinforced again the importance of taking medication exactly as prescribed. She stopped taking medication again and says she ran out. She did not call office for assistance. I expressed to her that this was a pattern and she has stopped her medication with the same negative results at least 3-4 other time in the last couple years. I explained that she was going to have to be compliant for me to continue to provide safe and adequate care.  Followed  up with PCP for thyroid.  She will restart her medication tomorrow. Continue Auvelity  90-210 daily Continue Abilify to 10  mg by mouth daily until next visit.  To follow up in 8 weeks to reassess.  Will report any worsening symptoms or side effects promptly Provided emergency contact information  Discussed potential metabolic side effects associated with atypical antipsychotics, as well as potential risk for movement side effects. Advised pt to contact office if movement side effects occur.   Reviewed PDMP      Joan Flores, NP

## 2023-05-10 NOTE — Telephone Encounter (Signed)
Prior Authorization submitted with [facility] for Auvelity 45-105 MG #60, pending response

## 2023-05-11 NOTE — Telephone Encounter (Signed)
Prior Approval received with [facility] for Auvelity 45-105 mg effective through 11/10/2023, PA# [id number]

## 2023-06-14 ENCOUNTER — Other Ambulatory Visit: Payer: Self-pay | Admitting: Behavioral Health

## 2023-06-14 DIAGNOSIS — F331 Major depressive disorder, recurrent, moderate: Secondary | ICD-10-CM

## 2023-06-14 DIAGNOSIS — F411 Generalized anxiety disorder: Secondary | ICD-10-CM

## 2023-06-17 ENCOUNTER — Inpatient Hospital Stay (HOSPITAL_COMMUNITY)
Admission: AD | Admit: 2023-06-17 | Discharge: 2023-06-23 | DRG: 885 | Disposition: A | Discharge status: Home / Self Care | Payer: 59 | Source: Intra-hospital | Attending: Psychiatry | Admitting: Psychiatry

## 2023-06-17 ENCOUNTER — Encounter (HOSPITAL_COMMUNITY): Payer: Self-pay | Admitting: Family Medicine

## 2023-06-17 ENCOUNTER — Ambulatory Visit (INDEPENDENT_AMBULATORY_CARE_PROVIDER_SITE_OTHER)
Admission: EM | Admit: 2023-06-17 | Discharge: 2023-06-17 | Disposition: A | Discharge status: Other Acute Inpatient Hospital | Payer: 59 | Source: Home / Self Care | Attending: Emergency Medicine | Admitting: Emergency Medicine

## 2023-06-17 ENCOUNTER — Other Ambulatory Visit: Payer: Self-pay

## 2023-06-17 DIAGNOSIS — Z813 Family history of other psychoactive substance abuse and dependence: Secondary | ICD-10-CM

## 2023-06-17 DIAGNOSIS — Z79890 Hormone replacement therapy (postmenopausal): Secondary | ICD-10-CM

## 2023-06-17 DIAGNOSIS — R45851 Suicidal ideations: Secondary | ICD-10-CM

## 2023-06-17 DIAGNOSIS — Z833 Family history of diabetes mellitus: Secondary | ICD-10-CM

## 2023-06-17 DIAGNOSIS — F419 Anxiety disorder, unspecified: Secondary | ICD-10-CM | POA: Diagnosis present

## 2023-06-17 DIAGNOSIS — F332 Major depressive disorder, recurrent severe without psychotic features: Secondary | ICD-10-CM | POA: Diagnosis present

## 2023-06-17 DIAGNOSIS — F314 Bipolar disorder, current episode depressed, severe, without psychotic features: Principal | ICD-10-CM | POA: Diagnosis present

## 2023-06-17 DIAGNOSIS — F411 Generalized anxiety disorder: Secondary | ICD-10-CM

## 2023-06-17 DIAGNOSIS — F331 Major depressive disorder, recurrent, moderate: Secondary | ICD-10-CM

## 2023-06-17 DIAGNOSIS — F339 Major depressive disorder, recurrent, unspecified: Secondary | ICD-10-CM

## 2023-06-17 DIAGNOSIS — E039 Hypothyroidism, unspecified: Secondary | ICD-10-CM | POA: Diagnosis present

## 2023-06-17 DIAGNOSIS — Z8249 Family history of ischemic heart disease and other diseases of the circulatory system: Secondary | ICD-10-CM

## 2023-06-17 DIAGNOSIS — F3130 Bipolar disorder, current episode depressed, mild or moderate severity, unspecified: Secondary | ICD-10-CM

## 2023-06-17 DIAGNOSIS — Z91148 Patient's other noncompliance with medication regimen for other reason: Secondary | ICD-10-CM | POA: Diagnosis not present

## 2023-06-17 DIAGNOSIS — Z79899 Other long term (current) drug therapy: Secondary | ICD-10-CM

## 2023-06-17 DIAGNOSIS — Z818 Family history of other mental and behavioral disorders: Secondary | ICD-10-CM

## 2023-06-17 HISTORY — DX: Prediabetes: R73.03

## 2023-06-17 LAB — COMPREHENSIVE METABOLIC PANEL
ALT: 17 U/L (ref 0–44)
AST: 23 U/L (ref 15–41)
Albumin: 3.7 g/dL (ref 3.5–5.0)
Alkaline Phosphatase: 92 U/L (ref 38–126)
Anion gap: 11 (ref 5–15)
BUN: 11 mg/dL (ref 6–20)
CO2: 24 mmol/L (ref 22–32)
Calcium: 9.2 mg/dL (ref 8.9–10.3)
Chloride: 103 mmol/L (ref 98–111)
Creatinine, Ser: 0.81 mg/dL (ref 0.44–1.00)
GFR, Estimated: >60 mL/min (ref >60)
Glucose, Bld: 77 mg/dL (ref 70–99)
Potassium: 3.8 mmol/L (ref 3.5–5.1)
Sodium: 138 mmol/L (ref 135–145)
Total Bilirubin: 0.7 mg/dL (ref 0.3–1.2)
Total Protein: 7.9 g/dL (ref 6.5–8.1)

## 2023-06-17 LAB — CBC WITH DIFFERENTIAL/PLATELET
Abs Immature Granulocytes: 0.03 10*3/uL (ref 0.00–0.07)
Basophils Absolute: 0.1 10*3/uL (ref 0.0–0.1)
Basophils Relative: 1 %
Eosinophils Absolute: 0.2 10*3/uL (ref 0.0–0.5)
Eosinophils Relative: 1 %
HCT: 39.8 % (ref 36.0–46.0)
Hemoglobin: 12.4 g/dL (ref 12.0–15.0)
Immature Granulocytes: 0 %
Lymphocytes Relative: 30 %
Lymphs Abs: 3.4 10*3/uL (ref 0.7–4.0)
MCH: 26.5 pg (ref 26.0–34.0)
MCHC: 31.2 g/dL (ref 30.0–36.0)
MCV: 85 fL (ref 80.0–100.0)
Monocytes Absolute: 0.7 10*3/uL (ref 0.1–1.0)
Monocytes Relative: 7 %
Neutro Abs: 6.7 10*3/uL (ref 1.7–7.7)
Neutrophils Relative %: 61 %
Platelets: 277 10*3/uL (ref 150–400)
RBC: 4.68 MIL/uL (ref 3.87–5.11)
RDW: 15.9 % — ABNORMAL HIGH (ref 11.5–15.5)
WBC: 11.1 10*3/uL — ABNORMAL HIGH (ref 4.0–10.5)
nRBC: 0 % (ref 0.0–0.2)

## 2023-06-17 LAB — POCT URINE DRUG SCREEN - MANUAL ENTRY (I-SCREEN)
POC Amphetamine UR: NOT DETECTED
POC Buprenorphine (BUP): NOT DETECTED
POC Cocaine UR: NOT DETECTED
POC Marijuana UR: NOT DETECTED
POC Methadone UR: NOT DETECTED
POC Methamphetamine UR: NOT DETECTED
POC Morphine: NOT DETECTED
POC Oxazepam (BZO): NOT DETECTED
POC Oxycodone UR: NOT DETECTED
POC Secobarbital (BAR): NOT DETECTED

## 2023-06-17 LAB — URINALYSIS, ROUTINE W REFLEX MICROSCOPIC
Bilirubin Urine: NEGATIVE
Glucose, UA: NEGATIVE mg/dL
Hgb urine dipstick: NEGATIVE
Ketones, ur: NEGATIVE mg/dL
Leukocytes,Ua: NEGATIVE
Nitrite: NEGATIVE
Protein, ur: NEGATIVE mg/dL
Specific Gravity, Urine: 1.008 (ref 1.005–1.030)
pH: 5 (ref 5.0–8.0)

## 2023-06-17 LAB — HEMOGLOBIN A1C
Hgb A1c MFr Bld: 5.6 % (ref 4.8–5.6)
Mean Plasma Glucose: 114.02 mg/dL

## 2023-06-17 LAB — POCT PREGNANCY, URINE: Preg Test, Ur: NEGATIVE

## 2023-06-17 LAB — LIPID PANEL
Cholesterol: 119 mg/dL (ref 0–200)
HDL: 59 mg/dL (ref >40)
LDL Cholesterol: 48 mg/dL (ref 0–99)
Total CHOL/HDL Ratio: 2 {ratio}
Triglycerides: 58 mg/dL (ref <150)
VLDL: 12 mg/dL (ref 0–40)

## 2023-06-17 LAB — TSH: TSH: 1.462 u[IU]/mL (ref 0.350–4.500)

## 2023-06-17 MED ORDER — LEVOTHYROXINE SODIUM 112 MCG PO TABS
112.0000 ug | ORAL_TABLET | Freq: Every day | ORAL | Status: DC
  Administered 2023-06-18 – 2023-06-23 (×6): 112 ug via ORAL
  Filled 2023-06-17 (×9): qty 1

## 2023-06-17 MED ORDER — MAGNESIUM HYDROXIDE 400 MG/5ML PO SUSP: 30.0000 mL | Freq: Every day | ORAL | Status: DC | PRN

## 2023-06-17 MED ORDER — ALUM & MAG HYDROXIDE-SIMETH 200-200-20 MG/5ML PO SUSP
30.0000 mL | ORAL | Status: DC | PRN
  Filled 2023-06-17: qty 30

## 2023-06-17 MED ORDER — ALUM & MAG HYDROXIDE-SIMETH 200-200-20 MG/5ML PO SUSP: 30.0000 mL | ORAL | Status: DC | PRN

## 2023-06-17 MED ORDER — ACETAMINOPHEN 325 MG PO TABS: 650.0000 mg | ORAL_TABLET | Freq: Four times a day (QID) | ORAL | Status: DC | PRN

## 2023-06-17 MED ORDER — DIPHENHYDRAMINE HCL 50 MG/ML IJ SOLN: 50.0000 mg | Freq: Three times a day (TID) | INTRAMUSCULAR | Status: DC | PRN

## 2023-06-17 MED ORDER — HALOPERIDOL 5 MG PO TABS: 5.0000 mg | ORAL_TABLET | Freq: Three times a day (TID) | ORAL | Status: DC | PRN

## 2023-06-17 MED ORDER — DIPHENHYDRAMINE HCL 25 MG PO CAPS: 50.0000 mg | ORAL_CAPSULE | Freq: Three times a day (TID) | ORAL | Status: DC | PRN

## 2023-06-17 MED ORDER — TRAZODONE HCL 50 MG PO TABS: 50.0000 mg | ORAL_TABLET | Freq: Every evening | ORAL | Status: DC | PRN

## 2023-06-17 MED ORDER — DEXTROMETHORPHAN-BUPROPION ER 45-105 MG PO TBCR
1.0000 | EXTENDED_RELEASE_TABLET | Freq: Two times a day (BID) | ORAL | Status: DC
  Administered 2023-06-17: 1 via ORAL

## 2023-06-17 MED ORDER — HALOPERIDOL LACTATE 5 MG/ML IJ SOLN: 5.0000 mg | Freq: Three times a day (TID) | INTRAMUSCULAR | Status: DC | PRN

## 2023-06-17 MED ORDER — HYDROXYZINE HCL 25 MG PO TABS
25.0000 mg | ORAL_TABLET | Freq: Three times a day (TID) | ORAL | Status: DC | PRN
  Administered 2023-06-17 – 2023-06-20 (×2): 25 mg via ORAL
  Filled 2023-06-17 (×2): qty 1

## 2023-06-17 MED ORDER — HYDROXYZINE HCL 25 MG PO TABS
25.0000 mg | ORAL_TABLET | Freq: Three times a day (TID) | ORAL | Status: DC | PRN
  Administered 2023-06-17: 25 mg via ORAL
  Filled 2023-06-17: qty 1

## 2023-06-17 MED ORDER — LEVOTHYROXINE SODIUM 112 MCG PO TABS: 112.0000 ug | ORAL_TABLET | Freq: Every day | ORAL | Status: DC

## 2023-06-17 MED ORDER — ARIPIPRAZOLE 10 MG PO TABS: 10.0000 mg | ORAL_TABLET | Freq: Every day | ORAL | Status: DC

## 2023-06-17 MED ORDER — DEXTROMETHORPHAN-BUPROPION ER 45-105 MG PO TBCR
1.0000 | EXTENDED_RELEASE_TABLET | Freq: Two times a day (BID) | ORAL | Status: DC
  Administered 2023-06-19 – 2023-06-23 (×9): 1 via ORAL

## 2023-06-17 MED ORDER — LORAZEPAM 1 MG PO TABS: 2.0000 mg | ORAL_TABLET | Freq: Three times a day (TID) | ORAL | Status: DC | PRN

## 2023-06-17 MED ORDER — TRAZODONE HCL 50 MG PO TABS
50.0000 mg | ORAL_TABLET | Freq: Every evening | ORAL | Status: DC | PRN
  Administered 2023-06-17 – 2023-06-18 (×2): 50 mg via ORAL
  Filled 2023-06-17: qty 1

## 2023-06-17 MED ORDER — LORAZEPAM 2 MG/ML IJ SOLN: 2.0000 mg | Freq: Three times a day (TID) | INTRAMUSCULAR | Status: DC | PRN

## 2023-06-17 MED ORDER — ARIPIPRAZOLE 10 MG PO TABS
10.0000 mg | ORAL_TABLET | Freq: Every day | ORAL | Status: DC
  Administered 2023-06-18 – 2023-06-23 (×6): 10 mg via ORAL
  Filled 2023-06-17 (×7): qty 1

## 2023-06-17 NOTE — Progress Notes (Addendum)
Pt was accepted to [facility] TODAY 06/17/2023; Bed Assignment 303-1 PENDING Labs  Pt meets inpatient criteria per Roderic Ovens  Attending Physician will be Dr. Phineas Inches, MD  Report can be called to:  -Adult unit: [phone]  Pt can arrive after: [facility] will coordinate with care team.  Care Team notified: Day [facility] [place], Evening [facility] [place], Night [facility] Kim Brooks,RN, Kimberly Harris,NP, Eric Isbanioly,RN, Bethany Teola [facility] [place], [place] 06/17/2023 @ 6:30 PM

## 2023-06-17 NOTE — ED Notes (Signed)
Report given to Jillian RN@[facility]  adult unit

## 2023-06-17 NOTE — Progress Notes (Signed)
Patient arrived to [facility] from [facility] voluntarily. Patient is depressed, flat affect, poor eye contact. During assessment, patient exhibiting anxiety as evidenced by nervous shaking of the right leg. Patient states that she just started feeling really sad and she doesn't know why. States that she takes 3 prescription medications that include Synthroid, Abilify and another medication that she doesn't remember. Patient denies substance use. Passive SI with plan to take "a bunch of pills". Patient was given PRN hydroxyzine and trazodone at bedtime.
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
## 2023-06-17 NOTE — ED Notes (Signed)
Lab called and said her blood was clotted and needed another CBC/DIFF re collected

## 2023-06-17 NOTE — Progress Notes (Signed)
   06/17/23 0958  [facility] Triage Screening (Walk-ins at [facility] only)  How Did You Hear About [place]? Family/Friend  What Is the Reason for Your Visit/Call Today? Patient presents to the [facility] with her father.  Patient has a history of depression and is seen on an OP basis by Judie Grieve at the [facility] OP [facility].  She states that she that she has not been taking her medications as prescribed and she has grown increasingly depressed.  Patient states that she has been having suicidal ideations to OD on pills.  She has no prior suicide attempts.  She denies HI/Psychosis and has not history of substance use.  She states that her last hospitalization was two years ago at [facility].  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  Patient is presenting with a very flat affect.  Patient is urgent due to her suicidal ideation with plan.  How Long Has This Been Causing You Problems? 1 wk - 1 month  Have You Recently Had Any Thoughts About Hurting Yourself? Yes  How long ago did you have thoughts about hurting yourself? suicidal today with plan to overdose  Are You Planning to Commit Suicide/Harm Yourself At This time? No  Have you Recently Had Thoughts About Hurting Someone Karolee Ohs? No  Are You Planning To Harm Someone At This Time? No  Are you currently experiencing any auditory, visual or other hallucinations? No  Have You Used Any Alcohol or Drugs in the Past 24 Hours? No  Do you have any current medical co-morbidities that require immediate attention? No  Clinician description of patient physical appearance/behavior: Patient has slumped posture, avoiding eye contact, flat affect.  What Do You Feel Would Help You the Most Today? Treatment for Depression or other mood problem  If access to [facility] Urgent Care was not available, would you have sought care in the Emergency Department? Yes  Determination of Need Urgent (48 hours)  Options For Referral [facility] Urgent Care;Inpatient Hospitalization
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
## 2023-06-17 NOTE — ED Notes (Signed)
Pt is laying down at this time crying in bed. Declined medication.   Awaiting bed assignment at [facility].
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
## 2023-06-17 NOTE — ED Provider Notes (Signed)
[facility] Care Continuous Assessment Admission H&P  Date: 06/17/23 Patient Name: Joyce [name] MRN: [id number] Chief Complaint: " I am very sad"   Diagnoses:  Final diagnoses:  MDD (major depressive disorder), recurrent episode, with atypical features (HCC)  Suicidal ideations    HPI:   Joyce [name] 22 y.o., female patient presented to [facility] as a walk in voluntary accompanied by father T(Tim: [phone]) with complaints of sadness  Joyce [name], 22 y.o., female patient seen face to face by this provider, consulted with Dr. Lucianne Muss; and chart reviewed on 06/17/23.   On evaluation Joyce [name]  states that she has been feeling sad for a "long time", father corroborates stating that patient has been depressed and sad "since high school". Patient denies being bullied in high school, however father states that she was an "outcast" in high school. Patient endorses suicidal thought with a plan ot overdose on her pills.Patient speaks clearly however speaks in a very low tone. Most responses are monosyllabic. Father believes this recent episode of extreme sadness was precipitated by patient feeling rejected by a "boyfriend" that she met online on a dating app. Patient ,per father, feels "worthless and unlovable". Patient has a full time job working at  [employer]".  Patient has been working and has not missed days until today. Patient states that she likes her job. Father states that patient has worked many different jobs and "doesn't stay long at any job". Patient has no history of violence. Patient denies substance use or alcohol use. Patients states that sometimes she feels paranoid and crys sometimes. Patient states that feels irritable at times. Patient is unable to identify any specific stressors or triggers. Per father and patient there in no history of abuse or trauma.  Patient was previously hospitalized at [facility] in 2022.  Patient gets outpatient services at [facility],  provider Arlys John prescribes Abilify and Auvelity for patient. Patient also has a script for levothyroxine, but does not know who prescribes this medication. Father states that patient is not taking her medications since "April or May". Father states that there are firearms in the house that are secured. Father states that patient's bedroom at home is filled with clothes, very disordered,  states "she is some kind of hoarder and her car is a mess and full of stuff as well".  When asked about leisure activities patient stated that she works at [employer]" on the weekends. 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    During evaluation Joyce [name] is sitting in no acute distress.  She is alert, oriented x 4, flat, cooperative and attentive. Her mood is sad and hopeless  with congruent affect.  She has normal speech decreased volume of speech, and appropriate behavior.  Objectively there is no evidence of psychosis/mania or delusional thinking.  Patient is able to converse coherently, appeared distracted, was not pre-occupied.  She denies homicidal ideation, and denies engaging in self harming behaviors. Patient endorse paranoia but does not elaborate. Patient is actively suicidal with a plan to overdose on her medication.  Patient meets criteria for inpatient psychiatric treatment.  Total Time spent with patient: 30 minutes  Musculoskeletal  Strength & Muscle Tone: within normal limits Gait & Station: normal Patient leans: N/A  Psychiatric Specialty Exam  Presentation General Appearance:  Appropriate for Environment  Eye Contact: Fair  Speech: Clear and Coherent  Speech Volume: Decreased  Handedness: Right  Mood and Affect  Mood: Depressed; Worthless  Affect: Civil Service fast streamer  Thought Processes: Coherent  Descriptions of Associations:Intact  Orientation:Full (Time, Place and Person)  Thought  Content:Logical  Diagnosis of Schizophrenia or Schizoaffective disorder in past: No   Hallucinations:Hallucinations: None  Ideas of Reference:None  Suicidal Thoughts:Suicidal Thoughts: Yes, Passive SI Passive Intent and/or Plan: With Plan  Homicidal Thoughts:Homicidal Thoughts: No   Sensorium  Memory: Immediate Good; Recent Good; Remote Fair  Judgment: Fair  Insight: Fair   Art therapist  Concentration: Good  Attention Span: Good  Recall: Fair  Fund of Knowledge: Good  Language: Fair   Psychomotor Activity  Psychomotor Activity: Psychomotor Activity: Normal   Assets  Assets: Housing; Health and safety inspector; Physical Health; Social Support   Sleep  Sleep: Sleep: Fair Number of Hours of Sleep: 5   Nutritional Assessment (For OBS and FBC admissions only) Has the patient had a weight loss or gain of 10 pounds or more in the last 3 months?: No Has the patient had a decrease in food intake/or appetite?: No Does the patient have dental problems?: No Does the patient have eating habits or behaviors that may be indicators of an eating disorder including binging or inducing vomiting?: No Has the patient recently lost weight without trying?: 0 Has the patient been eating poorly because of a decreased appetite?: 0 Malnutrition Screening Tool Score: 0    Physical Exam Constitutional:      Appearance: She is obese.  HENT:     Head: Normocephalic.     Nose: Nose normal.  Pulmonary:     Effort: Pulmonary effort is normal.  Musculoskeletal:        General: Normal range of motion.  Neurological:     Mental Status: She is alert.    Review of Systems  Constitutional:  Negative for fever.  HENT:  Negative for hearing loss.   Respiratory:  Negative for cough.   Cardiovascular:  Negative for chest pain.  Gastrointestinal:  Negative for heartburn, nausea and vomiting.  Skin:  Negative for itching and rash.    Blood pressure 119/86, pulse  61, temperature 98.7 F (37.1 C), temperature source Oral, resp. rate 18, SpO2 100%. There is no height or weight on file to calculate BMI.  Past Psychiatric History: Yes patient was hospitalized at [facility] in 2022 for depression.    Is the patient at risk to self? Yes  Has the patient been a risk to self in the past 6 months? No .    Has the patient been a risk to self within the distant past? No   Is the patient a risk to others? No   Has the patient been a risk to others in the past 6 months? No   Has the patient been a risk to others within the distant past? No   Past Medical History: Patient has history of hypothyroidism which is being treated with levothyroxin.  Family History: Father has history of depression and one suicide attempt. Father takes medication and is in therapy of rhis depression.  Mother has history of depression and also takes medication for depression.   Social History: Patient lives with her mother and father, she works full time in childcare. No pending legal charges. 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   Last Labs:  Admission on 06/17/2023  Component Date Value Ref Range Status   Preg Test, Ur 06/17/2023 NEGATIVE  NEGATIVE Final  Comment:        THE SENSITIVITY OF THIS METHODOLOGY IS >24 mIU/mL     Allergies: Patient has no known allergies.  Medications:  Facility Ordered Medications  Medication   acetaminophen (TYLENOL) tablet 650 mg   alum & mag hydroxide-simeth (MAALOX/MYLANTA) 200-200-20 MG/5ML suspension 30 mL   magnesium hydroxide (MILK OF MAGNESIA) suspension 30 mL   hydrOXYzine (ATARAX) tablet 25 mg   traZODone (DESYREL) tablet 50 mg   [START ON 06/18/2023] ARIPiprazole (ABILIFY) tablet 10 mg   [START ON 06/18/2023] levothyroxine (SYNTHROID) tablet 112 mcg   Dextromethorphan-buPROPion ER 45-105 MG TBCR 1 tablet   PTA Medications  Medication Sig   AUVELITY 45-105 MG TBCR TAKE ONE TABLET BY  MOUTH TWICE DAILY AT LEAST 8 HOURS IN BETWEEN DOSES   ARIPiprazole (ABILIFY) 10 MG tablet TAKE 1 TABLET BY MOUTH EVERY DAY   levothyroxine (SYNTHROID) 112 MCG tablet Take 112 mcg by mouth daily.      Medical Decision Making  Patient is recommended for inpatient treatment for mood stabilization and medication management. Patient will be in observation while awaiting inpatient placement. Patient is voluntary.   Lab Orders         CBC with Differential/Platelet         Comprehensive metabolic panel         TSH         Hemoglobin A1c         Lipid panel         Urinalysis, Routine w reflex microscopic -Urine, Clean Catch         POC urine preg, ED         POCT Urine Drug Screen - (I-Screen)         Pregnancy, urine POC    EKG   Meds ordered this encounter  Medications   acetaminophen (TYLENOL) tablet 650 mg   alum & mag hydroxide-simeth (MAALOX/MYLANTA) 200-200-20 MG/5ML suspension 30 mL   magnesium hydroxide (MILK OF MAGNESIA) suspension 30 mL   hydrOXYzine (ATARAX) tablet 25 mg   traZODone (DESYREL) tablet 50 mg   ARIPiprazole (ABILIFY) tablet 10 mg   levothyroxine (SYNTHROID) tablet 112 mcg   Dextromethorphan-buPROPion ER 45-105 MG TBCR 1 tablet       Recommendations  Based on my evaluation the patient does not appear to have an emergency medical condition.  Donato Heinz, NP 06/17/23  12:24 PM

## 2023-06-17 NOTE — ED Notes (Signed)
Pt sleeping@this time breathing even and unlabored will continue to monitor for safety 

## 2023-06-17 NOTE — ED Notes (Signed)
Pt is talking with provider now.

## 2023-06-17 NOTE — Progress Notes (Signed)
   06/17/23 2335  Psych Admission Type (Psych Patients Only)  Admission Status Voluntary  Psychosocial Assessment  Patient Complaints Anxiety;Depression  Eye Contact Fair  Facial Expression Flat;Sad  Affect Anxious;Sad;Flat  Speech Logical/coherent  Interaction Cautious;Guarded  Motor Activity Slow  Appearance/Hygiene In scrubs  Behavior Characteristics Cooperative;Calm  Mood Depressed;Pleasant  Aggressive Behavior  Effect No apparent injury  Thought Process  Coherency WDL  Content WDL  Delusions None reported or observed  Perception WDL  Hallucination None reported or observed  Judgment WDL  Confusion WDL  Danger to Self  Current suicidal ideation? Passive  Self-Injurious Behavior No self-injurious ideation or behavior indicators observed or expressed   Agreement Not to Harm Self Yes  Description of Agreement verbal  Danger to Others  Danger to Others None reported or observed

## 2023-06-17 NOTE — ED Notes (Signed)
Pt presents as a vol admission from home.  Has flat affect and depressed mood.  Reports chronic thoughts of self harm including overdosing on medication, " I wonder what it would be like to take a bunch of pills and not wake up".   Pt searched with no contraband found.  Labs and EKG performed. Pt was escorted to [facility] OBS side and offered food and something to drink.  Pt currently sitting on bed in view of nursing station. Will cont to monitor for safety.

## 2023-06-18 ENCOUNTER — Encounter (HOSPITAL_COMMUNITY): Payer: Self-pay

## 2023-06-18 DIAGNOSIS — F332 Major depressive disorder, recurrent severe without psychotic features: Secondary | ICD-10-CM | POA: Diagnosis not present

## 2023-06-18 DIAGNOSIS — F419 Anxiety disorder, unspecified: Secondary | ICD-10-CM | POA: Diagnosis present

## 2023-06-18 NOTE — Group Note (Signed)
Recreation Therapy Group Note   Group Topic:Problem Solving  Group Date: 06/18/2023 Start Time: 0930 End Time: 0950 Facilitators: Rhylen Shaheen-McCall, LRT,CTRS Location: [address] Dayroom   Group Topic: Communication, Team Building, Problem Solving  Goal Area(s) Addresses:  Patient will effectively work with peer towards shared goal.  Patient will identify skills used to make activity successful.  Patient will identify how skills used during activity can be applied to reach post d/c goals.   Intervention: STEM Activity- Glass blower/designer  Group Description: Tallest Pharmacist, community. In teams of 5-6, patients were given 11 craft pipe cleaners. Using the materials provided, patients were instructed to compete again the opposing team(s) to build the tallest free-standing structure from floor level. The activity was timed; difficulty increased by Clinical research associate as Production designer, theatre/television/film continued.  Systematically resources were removed with additional directions for example, placing one arm behind their back, working in silence, and shape stipulations. LRT facilitated post-activity discussion reviewing team processes and necessary communication skills involved in completion. Patients were encouraged to reflect how the skills utilized, or not utilized, in this activity can be incorporated to positively impact support systems post discharge.  Education: Pharmacist, community, Scientist, physiological, Discharge Planning   Education Outcome: Acknowledges education/In group clarification offered/Needs additional education.    Affect/Mood: Appropriate   Participation Level: Engaged   Participation Quality: Independent   Behavior: Appropriate   Speech/Thought Process: Focused   Insight: Good   Judgement: Good   Modes of Intervention: STEM Activity   Patient Response to Interventions:  Engaged   Education Outcome:  In group clarification offered    Clinical Observations/Individualized Feedback: Pt  attended and participated in group session.     Plan: Continue to engage patient in RT group sessions 2-3x/week.   Lashonda Sonneborn-McCall, LRT,CTRS 06/18/2023 11:43 AM

## 2023-06-18 NOTE — Progress Notes (Signed)
Joyce [name], is sad and soft spoken but she is cooperative and participating in activities with peers Denies currently SI/HI/AVH Voices no complaints

## 2023-06-18 NOTE — BHH Suicide Risk Assessment (Signed)
[facility] Admission Suicide Risk Assessment   Nursing information obtained from:  Patient Demographic factors:  Adolescent or young adult Current Mental Status:  Suicidal ideation indicated by patient Loss Factors:  Financial problems / change in socioeconomic status Historical Factors:  NA Risk Reduction Factors:  Living with another person, especially a relative, Positive social support  Total Time spent with patient: 1 hour Principal Problem: MDD (major depressive disorder), recurrent severe, without psychosis (HCC) Diagnosis:  Principal Problem:   MDD (major depressive disorder), recurrent severe, without psychosis (HCC) Active Problems:   Anxiety disorder, unspecified  Subjective Data: See H&P  Continued Clinical Symptoms:  Alcohol Use Disorder Identification Test Final Score (AUDIT): 0 The "Alcohol Use Disorders Identification Test", Guidelines for Use in Primary Care, Second Edition.  World Science writer [facility]). Score between 0-7:  no or low risk or alcohol related problems. Score between 8-15:  moderate risk of alcohol related problems. Score between 16-19:  high risk of alcohol related problems. Score 20 or above:  warrants further diagnostic evaluation for alcohol dependence and treatment.   CLINICAL FACTORS:   Depression:   Anhedonia Hopelessness Impulsivity Insomnia Severe   Musculoskeletal: Strength & Muscle Tone: within normal limits Gait & Station: normal Patient leans: N/A  Psychiatric Specialty Exam:  Presentation  General Appearance:  Appropriate for Environment  Eye Contact: Fair  Speech: Clear and Coherent  Speech Volume: Decreased  Handedness: Right   Mood and Affect  Mood: Depressed; Worthless  Affect: Civil Service fast streamer  Thought Processes: Coherent  Descriptions of Associations:Intact  Orientation:Full (Time, Place and Person)  Thought Content:Logical  History of Schizophrenia/Schizoaffective  disorder:No  Duration of Psychotic Symptoms:No data recorded Hallucinations:Hallucinations: None  Ideas of Reference:None  Suicidal Thoughts:Suicidal Thoughts: Yes, Passive SI Passive Intent and/or Plan: With Plan  Homicidal Thoughts:Homicidal Thoughts: No   Sensorium  Memory: Immediate Good; Recent Good; Remote Fair  Judgment: Fair  Insight: Fair   Art therapist  Concentration: Good  Attention Span: Good  Recall: Fair  Fund of Knowledge: Good  Language: Fair   Psychomotor Activity  Psychomotor Activity: Psychomotor Activity: Normal   Assets  Assets: Housing; Health and safety inspector; Physical Health; Social Support   Sleep  Sleep: Sleep: Fair Number of Hours of Sleep: 5    Physical Exam: Physical Exam ROS Blood pressure 102/66, pulse (!) 113, temperature 97.7 F (36.5 C), temperature source Oral, resp. rate 17, height 5\' 9"  (1.753 m), weight 101.9 kg, SpO2 100%. Body mass index is 33.17 kg/m.   COGNITIVE FEATURES THAT CONTRIBUTE TO RISK:  Polarized thinking    SUICIDE RISK:   Moderate:  Frequent suicidal ideation with limited intensity, and duration, some specificity in terms of plans, no associated intent, good self-control, limited dysphoria/symptomatology, some risk factors present, and identifiable protective factors, including available and accessible social support.  PLAN OF CARE: See H&P  I certify that inpatient services furnished can reasonably be expected to improve the patient's condition.   Lambros Cerro Abbott Pao, MD 06/18/2023, 1:38 PM

## 2023-06-18 NOTE — Plan of Care (Signed)
  Problem: Education: Goal: Mental status will improve Outcome: Progressing   Problem: Activity: Goal: Interest or engagement in activities will improve Outcome: Progressing   Problem: Education: Goal: Knowledge of [facility] Education information/materials will improve Outcome: Not Progressing Goal: Emotional status will improve Outcome: Not Progressing Goal: Verbalization of understanding the information provided will improve Outcome: Not Progressing
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
## 2023-06-18 NOTE — H&P (Signed)
Psychiatric Admission Assessment Adult  Patient Identification: Joyce [name] MRN:  [id number] Date of Evaluation:  06/18/2023  Chief Complaint:  Bipolar 1 disorder, depressed, severe (HCC) [F31.4]  History of Present Illness:  Joyce [name] is a 22 y.o., female with a past psychiatric history significant for MDD, anxiety who presents to the [facility] from behavioral health urgent care for evaluation and management of worsening depression and SI.  According to outside records, the patient patient presented to behavioral health urgent care building reporting worsening depression and active SI with a plan to overdose on medications.  Initial assessment on 06/18/2023, patient was evaluated on the inpatient unit, the patient reports diagnosed with depression about 2 years ago, reports worsening depressed mood over the past 2 to 3 weeks, reports active SI wanting to harm herself with a plan to overdose on medication this is started about 1 to 2 weeks ago and got more intense so her father took her to urgent care building for help.  She reports over the past 1 to 2 weeks decreased energy and motivation, fair appetite and concentration, admits to feeling hopeless helpless and worthless, she denies any current active SI intention or plan and able to contract for safety in the hospital but reports current passive SI wishing self dead.  She denies HI or AVH she denies paranoia or other delusions.  She denies symptoms consistent with mania or hypomania or PTSD.  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  Outpatient follow-up at  [facility] with last visit in August 2026 diagnosed with depression and anxiety with outpatient medication including Abilify 10 mg and Auvelity 45/105 mg twice daily.  Psych meds prior to admission:  Noncompliant with medications at home Abilify 10 mg and Auvelity 45/105 mg twice daily. Sleep Sleep:Sleep: Fair Number of Hours of Sleep: 5 For interrupted sleep at home  Collateral information: Spoke to patient's father Mr. Ansleigh Safer at [phone] who agreed with patient's information reported and added stressors as noted above, he confirmed no symptoms consistent with mania or hypomania, he also confirmed patient has been noncompliant with her psychotropic medications only takes it a few times a week.  Past Psychiatric History:  Prior Psychiatric diagnoses: Depression and anxiety Past Psychiatric Hospitalizations: Emergency room visit for increased depression once in 2022 but no psychiatric hospitalization  History of self mutilation: None Past suicide attempts: None Past history of HI, violent or aggressive behavior: None  Past Psychiatric medications trials: Per chart review Prozac was tried in the past History of ECT/TMS: None  Outpatient psychiatric Follow up: Follows up at [facility] Prior Outpatient Therapy: Father reports patient has been having inconsistent outpatient therapy, recently [facility] therapy [facility], so the counselor they are only once with plan to continue there after discharge    Is the patient at risk to self? Yes.    Has the patient been a risk to self in the past 6 months? No.  Has the patient been a risk to self within the distant past? No.  Is the patient a risk to others? No.  Has the patient been a risk to  others in the past 6 months? No.  Has the patient been a risk to others within the distant past? No.    Substance Use History: Non-smoker, denies alcohol use, denies illicit drug use currently or in the past  Alcohol Screening: 1. How often do  you have a drink containing alcohol?: Never 2. How many drinks containing alcohol do you have on a typical day when you are drinking?: 1 or 2 3. How often do you have six or more drinks on one occasion?: Never AUDIT-C Score: 0 4. How often during the last year have you found that you were not able to stop drinking once you had started?: Never 5. How often during the last year have you failed to do what was normally expected from you because of drinking?: Never 6. How often during the last year have you needed a first drink in the morning to get yourself going after a heavy drinking session?: Never 7. How often during the last year have you had a feeling of guilt of remorse after drinking?: Never 8. How often during the last year have you been unable to remember what happened the night before because you had been drinking?: Never 9. Have you or someone else been injured as a result of your drinking?: No 10. Has a relative or friend or a doctor or another health worker been concerned about your drinking or suggested you cut down?: No Alcohol Use Disorder Identification Test Final Score (AUDIT): 0  Substance Abuse History in the last 12 months:  No.   Tobacco Screening: Non-smoker    Past Medical/Surgical History:  Past Medical History:  Diagnosis Date   Constipation    Hypopituitarism (HCC)    Left foot drop    Pre-diabetes    Thyroid disease     Past Surgical History:  Procedure Laterality Date   CHOLECYSTECTOMY N/A 01/14/2022   Procedure: LAPAROSCOPIC CHOLECYSTECTOMY;  Surgeon: Stechschulte, Hyman Hopes, MD;  Location: [facility] OR;  Service: General;  Laterality: N/A;    Family History:  Family History  Problem Relation Age of Onset   Anxiety disorder Mother    Depression Mother    Hypertension Mother    Depression Father    Healthy Father    Drug abuse Brother    Hypertension Paternal Grandmother    Diabetes Paternal Grandmother     Family Psychiatric History:  Psychiatric  illness: Father and mother both have depression Suicide: Per patient's report father attempted suicide once Substance Abuse: None reported  Social History:  Social History   Substance and Sexual Activity  Alcohol Use Not Currently     Social History   Substance and Sexual Activity  Drug Use Never    Living situation: Lives with her parents in [place] Social support: Parents are supportive Marital Status: Never married Children: No children Education: 1 year of college, quit for financial problems 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   Allergies:  No Known Allergies  Lab Results:  Results for orders placed or performed during the hospital encounter of 06/17/23 (from the past 48 hour(s))  Pregnancy, urine POC     Status: None   Collection Time: 06/17/23 12:05 PM  Result Value Ref Range   Preg Test, Ur NEGATIVE NEGATIVE    Comment:  THE SENSITIVITY OF THIS METHODOLOGY IS >24 mIU/mL   Comprehensive metabolic panel     Status: None   Collection Time: 06/17/23 12:34 PM  Result Value Ref Range   Sodium 138 135 - 145 mmol/L   Potassium 3.8 3.5 - 5.1 mmol/L   Chloride 103 98 - 111 mmol/L   CO2 24 22 - 32 mmol/L   Glucose, Bld 77 70 - 99 mg/dL    Comment: Glucose reference range applies only to samples taken after fasting for at least 8 hours.   BUN 11 6 - 20 mg/dL   Creatinine, Ser 1.30 0.44 - 1.00 mg/dL   Calcium 9.2 8.9 - 86.5 mg/dL   Total Protein 7.9 6.5 - 8.1 g/dL   Albumin 3.7 3.5 - 5.0 g/dL   AST 23 15 - 41 U/L   ALT 17 0 - 44 U/L   Alkaline Phosphatase 92 38 - 126 U/L   Total Bilirubin 0.7 0.3 - 1.2 mg/dL   GFR, Estimated >78 >46 mL/min    Comment: (NOTE) Calculated using the CKD-EPI Creatinine Equation (2021)    Anion gap 11 5 - 15     Comment: Performed at [facility], [address], [place], [place] [address]  Lipid panel     Status: None   Collection Time: 06/17/23 12:34 PM  Result Value Ref Range   Cholesterol 119 0 - 200 mg/dL   Triglycerides 58 <284 mg/dL   HDL 59 >13 mg/dL   Total CHOL/HDL Ratio 2.0 RATIO   VLDL 12 0 - 40 mg/dL   LDL Cholesterol 48 0 - 99 mg/dL    Comment:        Total Cholesterol/HDL:CHD Risk Coronary Heart Disease Risk Table                     Men   Women  1/2 Average Risk   3.4   3.3  Average Risk       5.0   4.4  2 X Average Risk   9.6   7.1  3 X Average Risk  23.4   11.0        Use the calculated Patient Ratio above and the CHD Risk Table to determine the patient's CHD Risk.        ATP III CLASSIFICATION (LDL):  <100     mg/dL   Optimal  244-010  mg/dL   Near or Above                    Optimal  130-159  mg/dL   Borderline  272-536  mg/dL   High  >644     mg/dL   Very High Performed at [facility], [address], [place], [place] [address]   TSH     Status: None   Collection Time: 06/17/23 12:34 PM  Result Value Ref Range   TSH 1.462 0.350 - 4.500 uIU/mL    Comment: Performed by a 3rd Generation assay with a functional sensitivity of <=0.01 uIU/mL. Performed at [facility], [address] [place], [place] [address]   Urinalysis, Routine w reflex microscopic -Urine, Clean Catch     Status: None   Collection Time: 06/17/23 12:35 PM  Result Value Ref Range   Color, Urine YELLOW YELLOW   APPearance CLEAR CLEAR   Specific Gravity, Urine 1.008 1.005 - 1.030   pH 5.0 5.0 - 8.0   Glucose, UA NEGATIVE NEGATIVE mg/dL  Hgb urine dipstick NEGATIVE NEGATIVE   Bilirubin Urine NEGATIVE NEGATIVE   Ketones, ur NEGATIVE NEGATIVE mg/dL   Protein, ur NEGATIVE NEGATIVE mg/dL   Nitrite NEGATIVE NEGATIVE   Leukocytes,Ua NEGATIVE NEGATIVE    Comment: Performed at [facility], [address], [place], [place] [address]  POCT Urine Drug Screen - (I-Screen)      Status: Normal   Collection Time: 06/17/23 12:35 PM  Result Value Ref Range   POC Amphetamine UR None Detected NONE DETECTED (Cut Off Level 1000 ng/mL)   POC Secobarbital (BAR) None Detected NONE DETECTED (Cut Off Level 300 ng/mL)   POC Buprenorphine (BUP) None Detected NONE DETECTED (Cut Off Level 10 ng/mL)   POC Oxazepam (BZO) None Detected NONE DETECTED (Cut Off Level 300 ng/mL)   POC Cocaine UR None Detected NONE DETECTED (Cut Off Level 300 ng/mL)   POC Methamphetamine UR None Detected NONE DETECTED (Cut Off Level 1000 ng/mL)   POC Morphine None Detected NONE DETECTED (Cut Off Level 300 ng/mL)   POC Methadone UR None Detected NONE DETECTED (Cut Off Level 300 ng/mL)   POC Oxycodone UR None Detected NONE DETECTED (Cut Off Level 100 ng/mL)   POC Marijuana UR None Detected NONE DETECTED (Cut Off Level 50 ng/mL)  CBC with Differential/Platelet     Status: Abnormal   Collection Time: 06/17/23  9:16 PM  Result Value Ref Range   WBC 11.1 (H) 4.0 - 10.5 K/uL   RBC 4.68 3.87 - 5.11 MIL/uL   Hemoglobin 12.4 12.0 - 15.0 g/dL   HCT 47.8 29.5 - 62.1 %   MCV 85.0 80.0 - 100.0 fL   MCH 26.5 26.0 - 34.0 pg   MCHC 31.2 30.0 - 36.0 g/dL   RDW 30.8 (H) 65.7 - 84.6 %   Platelets 277 150 - 400 K/uL   nRBC 0.0 0.0 - 0.2 %   Neutrophils Relative % 61 %   Neutro Abs 6.7 1.7 - 7.7 K/uL   Lymphocytes Relative 30 %   Lymphs Abs 3.4 0.7 - 4.0 K/uL   Monocytes Relative 7 %   Monocytes Absolute 0.7 0.1 - 1.0 K/uL   Eosinophils Relative 1 %   Eosinophils Absolute 0.2 0.0 - 0.5 K/uL   Basophils Relative 1 %   Basophils Absolute 0.1 0.0 - 0.1 K/uL   Immature Granulocytes 0 %   Abs Immature Granulocytes 0.03 0.00 - 0.07 K/uL    Comment: Performed at [facility], [address] [place], [place] [address]  Hemoglobin A1c     Status: None   Collection Time: 06/17/23  9:16 PM  Result Value Ref Range   Hgb A1c MFr Bld 5.6 4.8 - 5.6 %    Comment: (NOTE) Pre diabetes:          5.7%-6.4%  Diabetes:               >6.4%  Glycemic control for   <7.0% adults with diabetes    Mean Plasma Glucose 114.02 mg/dL    Comment: Performed at [facility], [address], [place], [place] [address]    Blood Alcohol level:  No results found for: "ETH"  Metabolic Disorder Labs:  Lab Results  Component Value Date   HGBA1C 5.6 06/17/2023   MPG 114.02 06/17/2023   MPG 117 01/15/2023   No results found for: "PROLACTIN" Lab Results  Component Value Date   CHOL 119 06/17/2023   TRIG 58 06/17/2023   HDL 59 06/17/2023  CHOLHDL 2.0 06/17/2023   VLDL 12 06/17/2023   LDLCALC 48 06/17/2023   LDLCALC 37 01/15/2023    Current Medications: Current Facility-Administered Medications  Medication Dose Route Frequency Provider Last Rate Last Admin   acetaminophen (TYLENOL) tablet 650 mg  650 mg Oral Q6H PRN Bing Neighbors, NP       acetaminophen (TYLENOL) tablet 650 mg  650 mg Oral Q6H PRN Bing Neighbors, NP       alum & mag hydroxide-simeth (MAALOX/MYLANTA) 200-200-20 MG/5ML suspension 30 mL  30 mL Oral Q4H PRN Bing Neighbors, NP       alum & mag hydroxide-simeth (MAALOX/MYLANTA) 200-200-20 MG/5ML suspension 30 mL  30 mL Oral Q4H PRN Bing Neighbors, NP       ARIPiprazole (ABILIFY) tablet 10 mg  10 mg Oral Daily Bing Neighbors, NP   10 mg at 06/18/23 1610   Dextromethorphan-buPROPion ER 45-105 MG TBCR 1 tablet  1 tablet Oral BID Bing Neighbors, NP       diphenhydrAMINE (BENADRYL) capsule 50 mg  50 mg Oral TID PRN Bing Neighbors, NP       Or   diphenhydrAMINE (BENADRYL) injection 50 mg  50 mg Intramuscular TID PRN Bing Neighbors, NP       haloperidol (HALDOL) tablet 5 mg  5 mg Oral TID PRN Bing Neighbors, NP       Or   haloperidol lactate (HALDOL) injection 5 mg  5 mg Intramuscular TID PRN Bing Neighbors, NP       hydrOXYzine (ATARAX) tablet 25 mg  25 mg Oral TID PRN Bing Neighbors, NP   25 mg at 06/17/23 2319   levothyroxine (SYNTHROID) tablet 112 mcg   112 mcg Oral Q0600 Bing Neighbors, NP   112 mcg at 06/18/23 9604   LORazepam (ATIVAN) tablet 2 mg  2 mg Oral TID PRN Bing Neighbors, NP       Or   LORazepam (ATIVAN) injection 2 mg  2 mg Intramuscular TID PRN Bing Neighbors, NP       magnesium hydroxide (MILK OF MAGNESIA) suspension 30 mL  30 mL Oral Daily PRN Bing Neighbors, NP       magnesium hydroxide (MILK OF MAGNESIA) suspension 30 mL  30 mL Oral Daily PRN Bing Neighbors, NP       traZODone (DESYREL) tablet 50 mg  50 mg Oral QHS PRN Bing Neighbors, NP   50 mg at 06/17/23 2319    PTA Medications: Medications Prior to Admission  Medication Sig Dispense Refill Last Dose   ARIPiprazole (ABILIFY) 10 MG tablet TAKE 1 TABLET BY MOUTH EVERY DAY 90 tablet 0    AUVELITY 45-105 MG TBCR TAKE ONE TABLET BY MOUTH TWICE DAILY AT LEAST 8 HOURS IN BETWEEN DOSES 60 tablet 1    levothyroxine (SYNTHROID) 112 MCG tablet Take 112 mcg by mouth daily.       Musculoskeletal: Strength & Muscle Tone: within normal limits Gait & Station: normal Patient leans: N/A   Physical Findings: AIMS: Facial and Oral Movements Muscles of Facial Expression: None, normal Lips and Perioral Area: None, normal Jaw: None, normal Tongue: None, normal,Extremity Movements Upper (arms, wrists, hands, fingers): None, normal Lower (legs, knees, ankles, toes): None, normal, Trunk Movements Neck, shoulders, hips: None, normal, Overall Severity Severity of abnormal movements (highest score from questions above): None, normal Incapacitation due to abnormal movements: None, normal Patient's awareness of abnormal movements (rate only patient's  report): No Awareness, Dental Status Current problems with teeth and/or dentures?: No Does patient usually wear dentures?: No  CIWA:    COWS:     Psychiatric Specialty Exam:  General Appearance: Appears at stated age, dressed casually  Behavior: Withdrawn and guarded but calm in general  Psychomotor Activity:  Moderate psychomotor retardation noted, no agitation  Eye Contact: Limited Speech: Decreased amount, decreased tone and volume, increased latency   Mood: Moderately dysphoric Affect: Sad flat affect  Thought Process: Linear and goal-directed Descriptions of Associations: Intact yet concrete Thought Content: Hallucinations: Denies AH, VH, does not appear responding to stimuli Delusions: No paranoia  Suicidal Thoughts: Admits to passive SI wishing self dead but denies active SI, intention, plan, agrees to contract for safety in the hospital Homicidal Thoughts: Denies HI, intention, plan   Alertness/Orientation: Alert and fully oriented  Insight: poor Judgment: poor  Memory: Fair  Chartered certified accountant: Fair Attention Span: Fair Recall: [employer] of Knowledge: Fair   Physical Exam:  Physical Exam Vitals and nursing note reviewed.  Constitutional:      Appearance: Normal appearance.  HENT:     Head: Normocephalic and atraumatic.     Nose: Nose normal.  Eyes:     Extraocular Movements: Extraocular movements intact.  Pulmonary:     Effort: Pulmonary effort is normal.  Musculoskeletal:        General: Normal range of motion.     Cervical back: Normal range of motion.  Neurological:     General: No focal deficit present.     Mental Status: She is alert and oriented to person, place, and time. Mental status is at baseline.    Review of Systems  All other systems reviewed and are negative.  Blood pressure 102/66, pulse (!) 113, temperature 97.7 F (36.5 C), temperature source Oral, resp. rate 17, height 5\' 9"  (1.753 m), weight 101.9 kg, SpO2 100%. Body mass index is 33.17 kg/m.   Assets  Assets:Housing; Financial Resources/Insurance; Physical Health; Social Support    Treatment Plan Summary: Daily contact with patient to assess and evaluate symptoms and progress in treatment and Medication management  ASSESSMENT:  Principal Diagnosis: MDD (major  depressive disorder), recurrent severe, without psychosis (HCC) Diagnosis:  Principal Problem:   MDD (major depressive disorder), recurrent severe, without psychosis (HCC) Active Problems:   Anxiety disorder, unspecified   PLAN: Safety and Monitoring:  -- Voluntary admission to inpatient psychiatric unit for safety, stabilization and treatment  -- Daily contact with patient to assess and evaluate symptoms and progress in treatment  -- Patient's case to be discussed in multi-disciplinary team meeting  -- Observation Level : q15 minute checks  -- Vital signs:  q12 hours  -- Precautions: suicide, elopement, and assault  2. Medications:   Restarted home medication Auvelity 45/105 mg twice daily for depression, monitor efficacy and safety and adjust accordingly, noncompliance was reported at home  Restarted home medication Abilify 10 mg daily for mood and depression, noncompliance was reported at home  Restarted home medication Synthroid 112 mcg for hypothyroidism  Start trazodone 50 mg at bedtime as needed for sleep  Start Atarax 25 mg 3 times daily as needed for anxiety    The risks/benefits/side-effects/alternatives to this medication were discussed in detail with the patient and time was given for questions. The patient consents to medication trial.    -- Metabolic profile and EKG monitoring obtained while on an atypical antipsychotic (BMI: Lipid Panel: HbgA1c: QTc:)      3. Labs  Reviewed: CMP no significant abnormalities noted CBC no significant abnormalities noted except for slightly elevated blood blood cell count, hemoglobin A1c 5.6, urinalysis no UTI, pregnancy test negative, TSH within normal level, UDS negative.      Lab ordered: Repeat CBC 10/4, fasting lipid panel 10/4  4. Group and Therapy: -- Encouraged patient to participate in unit milieu and in scheduled group therapies  5. Discharge Planning:   -- Social work and case management to assist with discharge planning  and identification of hospital follow-up needs prior to discharge  -- Estimated LOS: 5-7 days  -- Discharge Concerns: Need to establish a safety plan; Medication compliance and effectiveness  -- Discharge Goals: Return home with outpatient referrals for mental health follow-up including medication management/psychotherapy   The patient is agreeable with the medication plan, as above. We will monitor the patient's response to pharmacologic treatment, and adjust medications as necessary. Patient is encouraged to participate in group therapy while admitted to the psychiatric unit. We will address other chronic and acute stressors, which contributed to the patient's worsening depression and SI, in order to reduce the risk of self-harm at discharge.   Physician Treatment Plan for Primary Diagnosis: MDD (major depressive disorder), recurrent severe, without psychosis (HCC) Long Term Goal(s): Improvement in symptoms so as ready for discharge  Short Term Goals: Ability to identify changes in lifestyle to reduce recurrence of condition will improve, Ability to verbalize feelings will improve, Ability to disclose and discuss suicidal ideas, and Ability to demonstrate self-control will improve   I certify that inpatient services furnished can reasonably be expected to improve the patient's condition.    Total Time Spent in Direct Patient Care:  I personally spent 55 minutes on the unit in direct patient care. The direct patient care time included face-to-face time with the patient, reviewing the patient's chart, communicating with other professionals, and coordinating care. Greater than 50% of this time was spent in counseling or coordinating care with the patient regarding goals of hospitalization, psycho-education, and discharge planning needs.    Lebert Lovern Abbott Pao, MD 10/4/20241:44 PM

## 2023-06-18 NOTE — Plan of Care (Signed)
  Problem: Education: Goal: Knowledge of [facility] Education information/materials will improve Outcome: Progressing Goal: Emotional status will improve Outcome: Progressing Goal: Mental status will improve Outcome: Progressing Goal: Verbalization of understanding the information provided will improve Outcome: Progressing   

## 2023-06-18 NOTE — Group Note (Signed)
Date:  06/18/2023 Time:  10:59 AM  Group Topic/Focus:  Goals Group:   The focus of this group is to help patients establish daily goals to achieve during treatment and discuss how the patient can incorporate goal setting into their daily lives to aide in recovery.    Participation Level:  Active  Participation Quality:  Appropriate  Affect:  Appropriate  Cognitive:  Alert  Insight: Appropriate  Engagement in Group:  Engaged  Modes of Intervention:  Discussion  Additional Comments:  NA  Beckie Busing 06/18/2023, 10:59 AM

## 2023-06-18 NOTE — [facility] Group Notes (Signed)
[facility] Group Notes:  (Nursing/MHT/Case Management/Adjunct)  Date:  06/18/2023  Time:  10:13 PM  Type of Therapy:    Wrap Up Group    Participation Level:  Did Not Attend    Joyce [name] 06/18/2023, 10:13 PM

## 2023-06-18 NOTE — BH IP Treatment Plan (Signed)
Interdisciplinary Treatment and Diagnostic Plan Update  06/18/2023 Time of Session: 10:30 AM Joyce [name] MRN: [id number]  Principal Diagnosis: Bipolar 1 disorder, depressed, severe (HCC)  Secondary Diagnoses: Principal Problem:   Bipolar 1 disorder, depressed, severe (HCC)   Current Medications:  Current Facility-Administered Medications  Medication Dose Route Frequency Provider Last Rate Last Admin   acetaminophen (TYLENOL) tablet 650 mg  650 mg Oral Q6H PRN Bing Neighbors, NP       acetaminophen (TYLENOL) tablet 650 mg  650 mg Oral Q6H PRN Bing Neighbors, NP       alum & mag hydroxide-simeth (MAALOX/MYLANTA) 200-200-20 MG/5ML suspension 30 mL  30 mL Oral Q4H PRN Bing Neighbors, NP       alum & mag hydroxide-simeth (MAALOX/MYLANTA) 200-200-20 MG/5ML suspension 30 mL  30 mL Oral Q4H PRN Bing Neighbors, NP       ARIPiprazole (ABILIFY) tablet 10 mg  10 mg Oral Daily Bing Neighbors, NP   10 mg at 06/18/23 4098   Dextromethorphan-buPROPion ER 45-105 MG TBCR 1 tablet  1 tablet Oral BID Bing Neighbors, NP       diphenhydrAMINE (BENADRYL) capsule 50 mg  50 mg Oral TID PRN Bing Neighbors, NP       Or   diphenhydrAMINE (BENADRYL) injection 50 mg  50 mg Intramuscular TID PRN Bing Neighbors, NP       haloperidol (HALDOL) tablet 5 mg  5 mg Oral TID PRN Bing Neighbors, NP       Or   haloperidol lactate (HALDOL) injection 5 mg  5 mg Intramuscular TID PRN Bing Neighbors, NP       hydrOXYzine (ATARAX) tablet 25 mg  25 mg Oral TID PRN Bing Neighbors, NP   25 mg at 06/17/23 2319   levothyroxine (SYNTHROID) tablet 112 mcg  112 mcg Oral Q0600 Bing Neighbors, NP   112 mcg at 06/18/23 1191   LORazepam (ATIVAN) tablet 2 mg  2 mg Oral TID PRN Bing Neighbors, NP       Or   LORazepam (ATIVAN) injection 2 mg  2 mg Intramuscular TID PRN Bing Neighbors, NP       magnesium hydroxide (MILK OF MAGNESIA) suspension 30 mL  30 mL Oral Daily PRN Bing Neighbors, NP       magnesium hydroxide (MILK OF MAGNESIA) suspension 30 mL  30 mL Oral Daily PRN Bing Neighbors, NP       traZODone (DESYREL) tablet 50 mg  50 mg Oral QHS PRN Bing Neighbors, NP   50 mg at 06/17/23 2319   PTA Medications: Medications Prior to Admission  Medication Sig Dispense Refill Last Dose   ARIPiprazole (ABILIFY) 10 MG tablet TAKE 1 TABLET BY MOUTH EVERY DAY 90 tablet 0    AUVELITY 45-105 MG TBCR TAKE ONE TABLET BY MOUTH TWICE DAILY AT LEAST 8 HOURS IN BETWEEN DOSES 60 tablet 1    levothyroxine (SYNTHROID) 112 MCG tablet Take 112 mcg by mouth daily.       Patient Stressors:    Patient Strengths:    Treatment Modalities: Medication Management, Group therapy, Case management,  1 to 1 session with clinician, Psychoeducation, Recreational therapy.   Physician Treatment Plan for Primary Diagnosis: Bipolar 1 disorder, depressed, severe (HCC) Long Term Goal(s):     Short Term Goals:    Medication Management: Evaluate patient's response, side effects, and tolerance of medication regimen.  Therapeutic Interventions: 1  to 1 sessions, Unit Group sessions and Medication administration.  Evaluation of Outcomes: Not Progressing  Physician Treatment Plan for Secondary Diagnosis: Principal Problem:   Bipolar 1 disorder, depressed, severe (HCC)  Long Term Goal(s):     Short Term Goals:       Medication Management: Evaluate patient's response, side effects, and tolerance of medication regimen.  Therapeutic Interventions: 1 to 1 sessions, Unit Group sessions and Medication administration.  Evaluation of Outcomes: Not Progressing   RN Treatment Plan for Primary Diagnosis: Bipolar 1 disorder, depressed, severe (HCC) Long Term Goal(s): Knowledge of disease and therapeutic regimen to maintain health will improve  Short Term Goals: Ability to remain free from injury will improve, Ability to verbalize frustration and anger appropriately will improve, Ability to  demonstrate self-control, Ability to participate in decision making will improve, Ability to verbalize feelings will improve, Ability to disclose and discuss suicidal ideas, Ability to identify and develop effective coping behaviors will improve, and Compliance with prescribed medications will improve  Medication Management: RN will administer medications as ordered by provider, will assess and evaluate patient's response and provide education to patient for prescribed medication. RN will report any adverse and/or side effects to prescribing provider.  Therapeutic Interventions: 1 on 1 counseling sessions, Psychoeducation, Medication administration, Evaluate responses to treatment, Monitor vital signs and CBGs as ordered, Perform/monitor CIWA, COWS, AIMS and Fall Risk screenings as ordered, Perform wound care treatments as ordered.  Evaluation of Outcomes: Progressing   LCSW Treatment Plan for Primary Diagnosis: Bipolar 1 disorder, depressed, severe (HCC) Long Term Goal(s): Safe transition to appropriate next level of care at discharge, Engage patient in therapeutic group addressing interpersonal concerns.  Short Term Goals: Engage patient in aftercare planning with referrals and resources, Increase social support, Increase ability to appropriately verbalize feelings, Increase emotional regulation, Facilitate acceptance of mental health diagnosis and concerns, Facilitate patient progression through stages of change regarding substance use diagnoses and concerns, Identify triggers associated with mental health/substance abuse issues, and Increase skills for wellness and recovery  Therapeutic Interventions: Assess for all discharge needs, 1 to 1 time with Social worker, Explore available resources and support systems, Assess for adequacy in community support network, Educate family and significant other(s) on suicide prevention, Complete Psychosocial Assessment, Interpersonal group therapy.  Evaluation  of Outcomes: Progressing   Progress in Treatment: Attending groups: Yes. Participating in groups: Yes. Taking medication as prescribed: Yes. Toleration medication: Yes. Family/Significant other contact made: No, will contact:  Whoever pt gives CSW permission to contact Patient understands diagnosis: Yes. Discussing patient identified problems/goals with staff: Yes. Medical problems stabilized or resolved: Yes. Denies suicidal/homicidal ideation: Yes. Issues/concerns per patient self-inventory: No.   New problem(s) identified: No, Describe:  None reported  New Short Term/Long Term Goal(s):  medication stabilization, elimination of SI thoughts, development of comprehensive mental wellness plan.    Patient Goals:  "  to get better, be less sad and depressed; seek medication and therapy services "   Discharge Plan or Barriers: Patient recently admitted. CSW will continue to follow and assess for appropriate referrals and possible discharge planning.    Reason for Continuation of Hospitalization: Depression Medication stabilization Suicidal ideation  Estimated Length of Stay: 5-7 days   Last 3 [place] Suicide Severity Risk Score: Flowsheet Row Ad[facility] (Current) [facility] Most recent reading at 06/17/2023 11:35 PM ED from 06/17/2023 in [facility] Most recent reading at 06/17/2023 11:53 AM ED to Hosp-Admission (Discharged) from  01/14/2022 in [facility]  SURGICAL Most recent reading at 01/13/2022 10:06 PM  C-SSRS RISK CATEGORY Low Risk High Risk No Risk       Last PHQ 2/9 Scores:    06/17/2023   10:57 AM 09/02/2021    4:48 AM  Depression screen PHQ 2/9  Decreased Interest 2 2  Down, Depressed, Hopeless 3 2  PHQ - 2 Score 5 4  Altered sleeping 2 2  Tired, decreased energy 2 2  Change in appetite 2 0  Feeling bad or failure about yourself  3 2  Trouble concentrating 3  2  Moving slowly or fidgety/restless 3 1  Suicidal thoughts 3 1  PHQ-9 Score 23 14  Difficult doing work/chores  Extremely dIfficult    Scribe for Treatment Team: Isabella Bowens, LCSWA 06/18/2023 1:26 PM

## 2023-06-19 DIAGNOSIS — F332 Major depressive disorder, recurrent severe without psychotic features: Secondary | ICD-10-CM | POA: Diagnosis not present

## 2023-06-19 LAB — CBC WITH DIFFERENTIAL/PLATELET
Abs Immature Granulocytes: 0.03 10*3/uL (ref 0.00–0.07)
Basophils Absolute: 0.1 10*3/uL (ref 0.0–0.1)
Basophils Relative: 1 %
Eosinophils Absolute: 0.2 10*3/uL (ref 0.0–0.5)
Eosinophils Relative: 2 %
HCT: 40.5 % (ref 36.0–46.0)
Hemoglobin: 12.3 g/dL (ref 12.0–15.0)
Immature Granulocytes: 0 %
Lymphocytes Relative: 32 %
Lymphs Abs: 3.6 10*3/uL (ref 0.7–4.0)
MCH: 27.2 pg (ref 26.0–34.0)
MCHC: 30.4 g/dL (ref 30.0–36.0)
MCV: 89.6 fL (ref 80.0–100.0)
Monocytes Absolute: 0.7 10*3/uL (ref 0.1–1.0)
Monocytes Relative: 7 %
Neutro Abs: 6.6 10*3/uL (ref 1.7–7.7)
Neutrophils Relative %: 58 %
Platelets: 284 10*3/uL (ref 150–400)
RBC: 4.52 MIL/uL (ref 3.87–5.11)
RDW: 16 % — ABNORMAL HIGH (ref 11.5–15.5)
WBC: 11.2 10*3/uL — ABNORMAL HIGH (ref 4.0–10.5)
nRBC: 0 % (ref 0.0–0.2)

## 2023-06-19 LAB — LIPID PANEL
Cholesterol: 114 mg/dL (ref 0–200)
HDL: 58 mg/dL (ref >40)
LDL Cholesterol: 47 mg/dL (ref 0–99)
Total CHOL/HDL Ratio: 2 {ratio}
Triglycerides: 43 mg/dL (ref <150)
VLDL: 9 mg/dL (ref 0–40)

## 2023-06-19 MED ORDER — TRAZODONE HCL 100 MG PO TABS
100.0000 mg | ORAL_TABLET | Freq: Every evening | ORAL | Status: DC | PRN
  Administered 2023-06-19 – 2023-06-22 (×4): 100 mg via ORAL
  Filled 2023-06-19 (×4): qty 1

## 2023-06-19 NOTE — [facility] Suicide Risk Assessment (Signed)
[facility] INPATIENT:  Family/Significant Other Suicide Prevention Education  Suicide Prevention Education:  Contact Attempts: (Father) Montine Hight [phone], (name of family member/significant other) has been identified by the patient as the family member/significant other with whom the patient will be residing, and identified as the person(s) who will aid the patient in the event of a mental health crisis.  With written consent from the patient, two attempts were made to provide suicide prevention education, prior to and/or following the patient's discharge.  We were unsuccessful in providing suicide prevention education.  A suicide education pamphlet was given to the patient to share with family/significant other.  Date and time of first attempt:  06/19/2023  /11:22am Date and time of second attempt:  CSW  team to complete   Joyce [name] 06/19/2023, 11:23 AM

## 2023-06-19 NOTE — Progress Notes (Addendum)
D. Pt presented with a sad affect/ depressed mood- poor eye contact during interaction this am- reported that she was upset due to not being able to reach her parents. Pt currently denies SI/HI and AVH   A. Labs and vitals monitored. Pt given and educated on medications. Pt supported emotionally and encouraged to express concerns and ask questions. Pt encouraged to stay out of her room and attend groups R. Pt remains safe with 15 minute checks. Will continue POC.    06/19/23 0900  Psych Admission Type (Psych Patients Only)  Admission Status Voluntary  Psychosocial Assessment  Patient Complaints Worrying;Depression;Anxiety  Eye Contact Brief  Facial Expression Sad  Affect Sad  Speech Logical/coherent  Interaction Guarded  Motor Activity Slow  Appearance/Hygiene Unremarkable  Behavior Characteristics Cooperative;Calm  Mood Depressed  Aggressive Behavior  Effect No apparent injury  Thought Process  Coherency WDL  Content WDL  Delusions None reported or observed  Perception WDL  Hallucination None reported or observed  Judgment WDL  Confusion WDL  Danger to Self  Current suicidal ideation? Denies  Agreement Not to Harm Self Yes  Description of Agreement agreed to contact staff before acting on harmful thoughts  Danger to Others  Danger to Others None reported or observed

## 2023-06-19 NOTE — [facility] Suicide Risk Assessment (Signed)
[facility] INPATIENT:  Family/Significant Other Suicide Prevention Education  Suicide Prevention Education:  Education Completed; Kalese Ensz [phone],  (name of family member/significant other) has been identified by the patient as the family member/significant other with whom the patient will be residing, and identified as the person(s) who will aid the patient in the event of a mental health crisis (suicidal ideations/suicide attempt).    Mother called CSW after message was left for father, stated that phone calls are going straight to voicemail.  CSW spoke with patient and obtained verbal consent to talk with mother and to provide the telephone number and required access code.  Mother reports that there are firearms in the home, but they are locked up and patient never has access to them.  Other information was provided, as indicated below.  With verbal consent from the patient, the family member/significant other has been provided the following suicide prevention education, prior to the and/or following the discharge of the patient.  The suicide prevention education provided includes the following: Suicide risk factors Suicide prevention and interventions National Suicide Hotline telephone number [facility] assessment telephone number [facility] Emergency Assistance 911 [facility] and/or Residential Mobile Crisis Unit telephone number  Request made of family/significant other to: Remove weapons (e.g., guns, rifles, knives), all items previously/currently identified as safety concern.   Remove drugs/medications (over-the-counter, prescriptions, illicit drugs), all items previously/currently identified as a safety concern.  The family member/significant other verbalizes understanding of the suicide prevention education information provided.  The family member/significant other agrees to remove the items of safety concern listed above.  Carloyn Jaeger  [place] 06/19/2023, 2:47 PM

## 2023-06-19 NOTE — Progress Notes (Signed)
   06/18/23 2100  Psych Admission Type (Psych Patients Only)  Admission Status Voluntary  Psychosocial Assessment  Patient Complaints Anxiety  Eye Contact Fair  Facial Expression Flat;Sad  Affect Anxious;Sad  Speech Logical/coherent  Interaction Hostile  Motor Activity Slow  Appearance/Hygiene In scrubs  Behavior Characteristics Cooperative;Calm  Aggressive Behavior  Effect No apparent injury  Thought Process  Coherency WDL  Content WDL  Delusions None reported or observed  Perception WDL  Hallucination None reported or observed  Judgment WDL  Confusion WDL  Danger to Self  Current suicidal ideation?  (Denies)  Agreement Not to Harm Self Yes  Description of Agreement Verbal   Pt. Remained stable throughout the night. No changes noted from earlier assessment.

## 2023-06-19 NOTE — Group Note (Signed)
Date:  06/19/2023 Time:  5:33 PM  Group Topic/Focus:  Wellness Toolbox:   The focus of this group is to discuss various aspects of wellness, balancing those aspects and exploring ways to increase the ability to experience wellness.  Patients will create a wellness toolbox for use upon discharge.    Participation Level:  Active  Participation Quality:  Appropriate  Affect:  Blunted  Cognitive:  Appropriate  Insight: Good  Engagement in Group:  Limited  Modes of Intervention:  Exploration  Additional Comments:     Reymundo Poll 06/19/2023, 5:33 PM

## 2023-06-19 NOTE — Plan of Care (Signed)
  Problem: Activity: Goal: Interest or engagement in activities will improve Outcome: Progressing

## 2023-06-19 NOTE — Progress Notes (Signed)
   06/19/23 0544  15 Minute Checks  Location Bedroom  Visual Appearance Calm  Behavior Sleeping  Sleep (Behavioral Health Patients Only)  Calculate sleep? (Click Yes once per 24 hr at 0600 safety check) Yes  Documented sleep last 24 hours 8

## 2023-06-19 NOTE — Progress Notes (Signed)
[facility] MD Progress Note  06/19/2023 10:57 AM FATIMA FEDIE  MRN:  [id number]   Reason for Admission:  CYTHINA MICKELSEN is a 22 y.o., female with a past psychiatric history significant for MDD, anxiety who presents to the [facility] from behavioral health urgent care for evaluation and management of worsening depression and SI.  According to outside records, the patient patient presented to behavioral health urgent care building reporting worsening depression and active SI with a plan to overdose on medications.The patient is currently on Hospital Day 2.   Chart Review from last 24 hours:  The patient's chart was reviewed and nursing notes were reviewed. The patient's case was discussed in multidisciplinary team meeting. Per [facility] patient is compliant with her medications on the unit including dextromethorphan/bupropion twice daily starting this morning, also compliant with Abilify 10 mg daily since yesterday and Synthroid 112 mcg daily.  As needed Atarax 25 mg was used as needed 10/3 at 11 PM, as needed trazodone was used on 10/3 and 10/4  Information Obtained Today During Patient Interview: The patient was seen and evaluated on the unit. On assessment today the patient reports feeling "okay I guess" she reports still feeling depressed and anxious but "slightly better" she scales her depressed mood 7 out of 1010 being the worst today compared to 10 out of 10 at time of admission.  She reports her improvement is mainly secondary to attending groups and interacting in the milieu.  She denies side effect to current medications, she reports interrupted sleep last night despite taking trazodone.  She reports fair appetite, reports her friend visited yesterday and she attempted to contact her parents by phone but he did not answer which made her feel depressed and had a crying spell secondary to that.  She denies any passive or active SI intention or plan reporting last time was yesterday morning,  denies HI or AVH.  Denies symptoms consistent with mania or hypomania or PTSD.  Sleep  Remains interrupted  Principal Problem: MDD (major depressive disorder), recurrent severe, without psychosis (HCC) Diagnosis: Principal Problem:   MDD (major depressive disorder), recurrent severe, without psychosis (HCC) Active Problems:   Anxiety disorder, unspecified    Past Psychiatric History:  Prior Psychiatric diagnoses: Depression and anxiety Past Psychiatric Hospitalizations: Emergency room visit for increased depression once in 2022 but no psychiatric hospitalization   History of self mutilation: None Past suicide attempts: None Past history of HI, violent or aggressive behavior: None   Past Psychiatric medications trials: Per chart review Prozac was tried in the past History of ECT/TMS: None   Outpatient psychiatric Follow up: Follows up at [facility] Prior Outpatient Therapy: Father reports patient has been having inconsistent outpatient therapy, recently [facility], so the counselor they are only once with plan to continue there after discharge  Past Medical History:  Past Medical History:  Diagnosis Date   Constipation    Hypopituitarism (HCC)    Left foot drop    Pre-diabetes    Thyroid disease     Past Surgical History:  Procedure Laterality Date   CHOLECYSTECTOMY N/A 01/14/2022   Procedure: LAPAROSCOPIC CHOLECYSTECTOMY;  Surgeon: Stechschulte, Hyman Hopes, MD;  Location: [facility] OR;  Service: General;  Laterality: N/A;   Family History:  Family History  Problem Relation Age of Onset   Anxiety disorder Mother    Depression Mother    Hypertension Mother    Depression Father    Healthy Father    Drug abuse  Brother    Hypertension Paternal Grandmother    Diabetes Paternal Grandmother    Family Psychiatric  History:  Psychiatric illness: Father and mother both have depression Suicide: Per patient's report father attempted suicide once Substance Abuse: None  reported Social History:  Living situation: [place] with her parents in [place] Social support: Parents are supportive Marital Status: Never married Children: No children Education: 1 year of college, quit for financial problems 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  Current Medications: Current Facility-Administered Medications  Medication Dose Route Frequency Provider Last Rate Last Admin   acetaminophen (TYLENOL) tablet 650 mg  650 mg Oral Q6H PRN Bing Neighbors, NP       acetaminophen (TYLENOL) tablet 650 mg  650 mg Oral Q6H PRN Bing Neighbors, NP       alum & mag hydroxide-simeth (MAALOX/MYLANTA) 200-200-20 MG/5ML suspension 30 mL  30 mL Oral Q4H PRN Bing Neighbors, NP       alum & mag hydroxide-simeth (MAALOX/MYLANTA) 200-200-20 MG/5ML suspension 30 mL  30 mL Oral Q4H PRN Bing Neighbors, NP       ARIPiprazole (ABILIFY) tablet 10 mg  10 mg Oral Daily Bing Neighbors, NP   10 mg at 06/19/23 0813   Dextromethorphan-buPROPion ER 45-105 MG TBCR 1 tablet  1 tablet Oral BID Bing Neighbors, NP   1 tablet at 06/19/23 1610   diphenhydrAMINE (BENADRYL) capsule 50 mg  50 mg Oral TID PRN Bing Neighbors, NP       Or   diphenhydrAMINE (BENADRYL) injection 50 mg  50 mg Intramuscular TID PRN Bing Neighbors, NP       haloperidol (HALDOL) tablet 5 mg  5 mg Oral TID PRN Bing Neighbors, NP       Or   haloperidol lactate (HALDOL) injection 5 mg  5 mg Intramuscular TID PRN Bing Neighbors, NP       hydrOXYzine (ATARAX) tablet 25 mg  25 mg Oral TID PRN Bing Neighbors, NP   25 mg at 06/17/23 2319   levothyroxine (SYNTHROID) tablet 112 mcg  112 mcg Oral Q0600 Bing Neighbors, NP   112 mcg at 06/19/23 0610   LORazepam (ATIVAN) tablet 2 mg  2 mg Oral TID  PRN Bing Neighbors, NP       Or   LORazepam (ATIVAN) injection 2 mg  2 mg Intramuscular TID PRN Bing Neighbors, NP       magnesium hydroxide (MILK OF MAGNESIA) suspension 30 mL  30 mL Oral Daily PRN Bing Neighbors, NP       magnesium hydroxide (MILK OF MAGNESIA) suspension 30 mL  30 mL Oral Daily PRN Bing Neighbors, NP       traZODone (DESYREL) tablet 50 mg  50 mg Oral QHS PRN Bing Neighbors, NP   50 mg at 06/18/23 2125    Lab Results:  Results for orders placed or performed during the hospital encounter of 06/17/23 (from the past 48 hour(s))  CBC with Differential/Platelet     Status: Abnormal   Collection Time: 06/19/23  6:57 AM  Result Value Ref Range   WBC 11.2 (H) 4.0 - 10.5 K/uL   RBC 4.52 3.87 - 5.11 MIL/uL   Hemoglobin 12.3 12.0 - 15.0  g/dL   HCT 40.9 81.1 - 91.4 %   MCV 89.6 80.0 - 100.0 fL   MCH 27.2 26.0 - 34.0 pg   MCHC 30.4 30.0 - 36.0 g/dL   RDW 78.2 (H) 95.6 - 21.3 %   Platelets 284 150 - 400 K/uL   nRBC 0.0 0.0 - 0.2 %   Neutrophils Relative % 58 %   Neutro Abs 6.6 1.7 - 7.7 K/uL   Lymphocytes Relative 32 %   Lymphs Abs 3.6 0.7 - 4.0 K/uL   Monocytes Relative 7 %   Monocytes Absolute 0.7 0.1 - 1.0 K/uL   Eosinophils Relative 2 %   Eosinophils Absolute 0.2 0.0 - 0.5 K/uL   Basophils Relative 1 %   Basophils Absolute 0.1 0.0 - 0.1 K/uL   Immature Granulocytes 0 %   Abs Immature Granulocytes 0.03 0.00 - 0.07 K/uL    Comment: Performed at [facility], [address], [place], [place] [address]  Lipid panel     Status: None   Collection Time: 06/19/23  6:57 AM  Result Value Ref Range   Cholesterol 114 0 - 200 mg/dL   Triglycerides 43 <846 mg/dL   HDL 58 >96 mg/dL   Total CHOL/HDL Ratio 2.0 RATIO   VLDL 9 0 - 40 mg/dL   LDL Cholesterol 47 0 - 99 mg/dL    Comment:        Total Cholesterol/HDL:CHD Risk Coronary Heart Disease Risk Table                     Men   Women  1/2 Average Risk   3.4   3.3  Average Risk       5.0    4.4  2 X Average Risk   9.6   7.1  3 X Average Risk  23.4   11.0        Use the calculated Patient Ratio above and the CHD Risk Table to determine the patient's CHD Risk.        ATP III CLASSIFICATION (LDL):  <100     mg/dL   Optimal  295-284  mg/dL   Near or Above                    Optimal  130-159  mg/dL   Borderline  132-440  mg/dL   High  >102     mg/dL   Very High Performed at [facility], [address] [place], [place] [address]     Blood Alcohol level:  No results found for: "ETH"  Metabolic Disorder Labs: Lab Results  Component Value Date   HGBA1C 5.6 06/17/2023   MPG 114.02 06/17/2023   MPG 117 01/15/2023   No results found for: "PROLACTIN" Lab Results  Component Value Date   CHOL 114 06/19/2023   TRIG 43 06/19/2023   HDL 58 06/19/2023   CHOLHDL 2.0 06/19/2023   VLDL 9 06/19/2023   LDLCALC 47 06/19/2023   LDLCALC 48 06/17/2023    Physical Findings: AIMS: Facial and Oral Movements Muscles of Facial Expression: None, normal Lips and Perioral Area: None, normal Jaw: None, normal Tongue: None, normal,Extremity Movements Upper (arms, wrists, hands, fingers): None, normal Lower (legs, knees, ankles, toes): None, normal, Trunk Movements Neck, shoulders, hips: None, normal, Overall Severity Severity of abnormal movements (highest score from questions above): None, normal Incapacitation due to abnormal movements: None, normal Patient's awareness of abnormal movements (rate only patient's report): No Awareness,  Dental Status Current problems with teeth and/or dentures?: No Does patient usually wear dentures?: No  CIWA:    COWS:     Musculoskeletal: Strength & Muscle Tone: within normal limits Gait & Station: normal Patient leans: N/A  Psychiatric Specialty Exam:  General Appearance: Appears at stated age, dressed casually   Behavior: Withdrawn and guarded but calm in general   Psychomotor Activity: Moderate psychomotor  retardation noted, no agitation   Eye Contact: Limited Speech: Decreased amount, decreased tone and volume, slightly less delayed and more fluent than yesterday     Mood: Moderately dysphoric Affect: Sad flat affect, slightly better   Thought Process: Linear and goal-directed Descriptions of Associations: Intact yet concrete Thought Content: Hallucinations: Denies AH, VH, does not appear responding to stimuli Delusions: No paranoia  Suicidal Thoughts: Admits to passive SI wishing self dead but denies active SI, intention, plan, agrees to contract for safety in the hospital Homicidal Thoughts: Denies HI, intention, plan    Alertness/Orientation: Alert and fully oriented   Insight: Limited Judgment: Limited   Memory: Fair   Art therapist  Concentration: Fair Attention Span: Fair Recall: [employer] of Knowledge: Fair    Assets  Assets: Housing; Health and safety inspector; Physical Health; Social Support    Physical Exam: Physical Exam Vitals and nursing note reviewed.    Review of Systems  All other systems reviewed and are negative.  Blood pressure (!) 100/45, pulse 98, temperature 98.1 F (36.7 C), temperature source Oral, resp. rate 17, height 5\' 9"  (1.753 m), weight 101.9 kg, SpO2 100%. Body mass index is 33.17 kg/m.   Treatment Plan Summary: Daily contact with patient to assess and evaluate symptoms and progress in treatment and Medication management  ASSESSMENT:  Diagnoses / Active Problems: Principal Problem: MDD (major depressive disorder), recurrent severe, without psychosis (HCC) Diagnosis: Principal Problem:   MDD (major depressive disorder), recurrent severe, without psychosis (HCC) Active Problems:   Anxiety disorder, unspecified   PLAN: Safety and Monitoring:  -- Voluntary admission to inpatient psychiatric unit for safety, stabilization and treatment  -- Daily contact with patient to assess and evaluate symptoms and progress in  treatment  -- Patient's case to be discussed in multi-disciplinary team meeting  -- Observation Level : q15 minute checks  -- Vital signs:  q12 hours  -- Precautions: suicide, elopement, and assault  2.  Continue home medication Auvelity 45/105 mg twice daily for depression, monitor efficacy and safety and adjust accordingly, noncompliance was reported at home            Continue home medication Abilify 10 mg daily for mood and depression, noncompliance was reported at home            Continue home medication Synthroid 112 mcg for hypothyroidism             Titrate trazodone from 50-100 mg at bedtime as needed for sleep             Continue Atarax 25 mg 3 times daily as needed for anxiety      The risks/benefits/side-effects/alternatives to this medication were discussed in detail with the patient and time was given for questions. The patient consents to medication trial.                -- Metabolic profile and EKG monitoring obtained while on an atypical antipsychotic (BMI: Lipid Panel: HbgA1c: QTc:)  3. Labs Reviewed: CMP no significant abnormalities noted CBC no significant abnormalities noted except for slightly elevated blood blood cell count, hemoglobin A1c 5.6, urinalysis no UTI, pregnancy test negative, TSH within normal level, UDS negative. CBC 10/5, white blood cell count remains slightly elevated 11.2 Lipid panel 10/5 within normal level     Lab ordered: None   4. Group and Therapy: -- Encouraged patient to participate in unit milieu and in scheduled group therapies   5. Discharge Planning:              -- Social work and case management to assist with discharge planning and identification of hospital follow-up needs prior to discharge             -- Estimated LOS: 5-7 days             -- Discharge Concerns: Need to establish a safety plan; Medication compliance and effectiveness             -- Discharge Goals: Return home with outpatient referrals  for mental health follow-up including medication management/psychotherapy     The patient is agreeable with the medication plan, as above. We will monitor the patient's response to pharmacologic treatment, and adjust medications as necessary. Patient is encouraged to participate in group therapy while admitted to the psychiatric unit. We will address other chronic and acute stressors, which contributed to the patient's worsening depression and SI, in order to reduce the risk of self-harm at discharge.     Physician Treatment Plan for Primary Diagnosis: MDD (major depressive disorder), recurrent severe, without psychosis (HCC) Long Term Goal(s): Improvement in symptoms so as ready for discharge   Short Term Goals: Ability to identify changes in lifestyle to reduce recurrence of condition will improve, Ability to verbalize feelings will improve, Ability to disclose and discuss suicidal ideas, and Ability to demonstrate self-control will improve     I certify that inpatient services furnished can reasonably be expected to improve the patient's condition.       Total Time Spent in Direct Patient Care:  I personally spent 35 minutes on the unit in direct patient care. The direct patient care time included face-to-face time with the patient, reviewing the patient's chart, communicating with other professionals, and coordinating care. Greater than 50% of this time was spent in counseling or coordinating care with the patient regarding goals of hospitalization, psycho-education, and discharge planning needs.   Raynisha Avilla Abbott Pao, MD 06/19/2023, 10:57 AM

## 2023-06-19 NOTE — Group Note (Unsigned)
Date:  06/20/2023 Time:  1:29 AM  Group Topic/Focus:  Wrap-Up Group:   The focus of this group is to help patients review their daily goal of treatment and discuss progress on daily workbooks.    Participation Level:  Active  Participation Quality:  Appropriate and Sharing  Affect:  Appropriate  Cognitive:  Appropriate  Insight: Appropriate  Engagement in Group:  Engaged  Modes of Intervention:  Activity and Socialization  Additional Comments:  The patient stated that her day stated off "bad". The patient stated that she was "tired" and that she couldn't get in contact with her parents. However, the patient stated that her day did "get better". The patient stated that she eventually was able to talk to her mom and that made her feel "happy" and that she also spoke with her brother who she stated was very "supportive". The patient stated that her goal is to "get better". The patient rated her day a 8/10. The patient participated in the ice breaker group activity.   Kennieth Francois 06/20/2023, 1:29 AM

## 2023-06-19 NOTE — Group Note (Signed)
Date:  06/19/2023 Time:  10:45 AM  Group Topic/Focus:  Goals Group:   The focus of this group is to help patients establish daily goals to achieve during treatment and discuss how the patient can incorporate goal setting into their daily lives to aide in recovery.    Participation Level:  Active  Participation Quality:  Appropriate  Affect:  Appropriate  Cognitive:  Appropriate  Insight: Appropriate  Engagement in Group:  Engaged  Modes of Intervention:  Discussion    Additional Comments:     Reymundo Poll 06/19/2023, 10:45 AM

## 2023-06-20 DIAGNOSIS — F332 Major depressive disorder, recurrent severe without psychotic features: Secondary | ICD-10-CM | POA: Diagnosis not present

## 2023-06-20 NOTE — Plan of Care (Signed)
  Problem: Education: Goal: Knowledge of [facility] Education information/materials will improve Outcome: Progressing   Problem: Education: Goal: Emotional status will improve Outcome: Progressing   Problem: Education: Goal: Mental status will improve Outcome: Progressing   Problem: Safety: Goal: Periods of time without injury will increase Outcome: Progressing

## 2023-06-20 NOTE — [facility] Group Notes (Signed)
[facility] Group Notes:  (Nursing/MHT/Case Management/Adjunct)  Date:  06/20/2023  Time:  2:50 PM  Type of Therapy:  Psychoeducational Skills  Participation Level:  Active  Participation Quality:  Appropriate  Affect:  Appropriate  Cognitive:  Alert and Appropriate  Insight:  Appropriate  Engagement in Group:  Engaged  Modes of Intervention:  Discussion, Education, and Exploration  Summary of Progress/Problems: A mental health wellness podcast was play ''[name] On Purpose'' with themes of recognizing impulsive behaviors and how to implement the 90 second rule. Patients were then educated on positive reframing and how to recognize negative thinking patterns. Pt attended and was appropriate.   Joyce [name] 06/20/2023, 2:50 PM

## 2023-06-20 NOTE — Progress Notes (Signed)
   06/20/23 1200  Psych Admission Type (Psych Patients Only)  Admission Status Voluntary  Psychosocial Assessment  Patient Complaints Depression  Eye Contact Brief  Facial Expression Sad  Affect Sad  Speech Logical/coherent  Interaction Guarded  Motor Activity Slow  Appearance/Hygiene Unremarkable  Behavior Characteristics Cooperative;Calm  Mood Depressed  Aggressive Behavior  Effect No apparent injury  Thought Process  Coherency WDL  Content WDL  Delusions None reported or observed  Perception WDL  Hallucination None reported or observed  Judgment WDL  Confusion WDL  Danger to Self  Current suicidal ideation? Denies  Agreement Not to Harm Self Yes  Description of Agreement agreed to contact staff before acting on harmful thoughts  Danger to Others  Danger to Others None reported or observed

## 2023-06-20 NOTE — Progress Notes (Signed)
   06/20/23 0600  15 Minute Checks  Location Bedroom  Visual Appearance Calm  Behavior Sleeping  Sleep (Behavioral Health Patients Only)  Calculate sleep? (Click Yes once per 24 hr at 0600 safety check) Yes  Documented sleep last 24 hours 7.25

## 2023-06-20 NOTE — Progress Notes (Signed)
[facility] MD Progress Note  06/20/2023 9:55 AM Joyce [name]  MRN:  [id number]   Reason for Admission:  Joyce [name] is a 22 y.o., female with a past psychiatric history significant for MDD, anxiety who presents to the [facility] from behavioral health urgent care for evaluation and management of worsening depression and SI.  According to outside records, the patient patient presented to behavioral health urgent care building reporting worsening depression and active SI with a plan to overdose on medications.The patient is currently on Hospital Day 3.   Chart Review from last 24 hours:  The patient's chart was reviewed and nursing notes were reviewed. The patient's case was discussed in multidisciplinary team meeting. Per [facility] patient is compliant with her medications on the unit including dextromethorphan/bupropion twice daily starting this morning, also compliant with Abilify 10 mg daily since yesterday and Synthroid 112 mcg daily.  As needed Atarax 25 mg was used as needed 10/3 at 11 PM, as needed trazodone 100 mg was used on 10/5  Information Obtained Today During Patient Interview: The patient was seen and evaluated on the unit.  Patient continues to present withdrawn but seems to be more fluent and less depressed, she was observed several times yesterday and today in the dayroom interacting with the milieu.  She does report feeling "little better" she denies wishing self dead any longer with last time 2 days ago she denies passive or active SI intention or plan she is contributing her improvement to taking medication but still having depressed mood on and off with occasional thoughts of "I am not good enough" she scales her depression 5 out of 1010 being the worst compared to 10 out of 10 at time of admission.  She reports fair sleep and appetite, she denies HI or AVH.  She reports had a phone call with her mother yesterday and mother was very supportive.  She denies side effect to  current medication regimen and agrees to continue with compliance after discharge.  Sleep  Remains interrupted  Principal Problem: MDD (major depressive disorder), recurrent severe, without psychosis (HCC) Diagnosis: Principal Problem:   MDD (major depressive disorder), recurrent severe, without psychosis (HCC) Active Problems:   Anxiety disorder, unspecified    Past Psychiatric History:  Prior Psychiatric diagnoses: Depression and anxiety Past Psychiatric Hospitalizations: Emergency room visit for increased depression once in 2022 but no psychiatric hospitalization   History of self mutilation: None Past suicide attempts: None Past history of HI, violent or aggressive behavior: None   Past Psychiatric medications trials: Per chart review Prozac was tried in the past History of ECT/TMS: None   Outpatient psychiatric Follow up: Follows up at [facility] Prior Outpatient Therapy: Father reports patient has been having inconsistent outpatient therapy, recently [facility], so the counselor they are only once with plan to continue there after discharge  Past Medical History:  Past Medical History:  Diagnosis Date   Constipation    Hypopituitarism (HCC)    Left foot drop    Pre-diabetes    Thyroid disease     Past Surgical History:  Procedure Laterality Date   CHOLECYSTECTOMY N/A 01/14/2022   Procedure: LAPAROSCOPIC CHOLECYSTECTOMY;  Surgeon: Stechschulte, Hyman Hopes, MD;  Location: [facility] OR;  Service: General;  Laterality: N/A;   Family History:  Family History  Problem Relation Age of Onset   Anxiety disorder Mother    Depression Mother    Hypertension Mother    Depression Father    Healthy  Father    Drug abuse Brother    Hypertension Paternal Grandmother    Diabetes Paternal Grandmother    Family Psychiatric  History:  Psychiatric illness: Father and mother both have depression Suicide: Per patient's report father attempted suicide once Substance Abuse:  None reported Social History:  Living situation: Lives with her parents in [place] Social support: Parents are supportive Marital Status: Never married Children: No children Education: 1 year of college, quit for financial problems 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  Current Medications: Current Facility-Administered Medications  Medication Dose Route Frequency Provider Last Rate Last Admin   acetaminophen (TYLENOL) tablet 650 mg  650 mg Oral Q6H PRN Bing Neighbors, NP       acetaminophen (TYLENOL) tablet 650 mg  650 mg Oral Q6H PRN Bing Neighbors, NP       alum & mag hydroxide-simeth (MAALOX/MYLANTA) 200-200-20 MG/5ML suspension 30 mL  30 mL Oral Q4H PRN Bing Neighbors, NP       alum & mag hydroxide-simeth (MAALOX/MYLANTA) 200-200-20 MG/5ML suspension 30 mL  30 mL Oral Q4H PRN Bing Neighbors, NP       ARIPiprazole (ABILIFY) tablet 10 mg  10 mg Oral Daily Bing Neighbors, NP   10 mg at 06/20/23 0754   Dextromethorphan-buPROPion ER 45-105 MG TBCR 1 tablet  1 tablet Oral BID Bing Neighbors, NP   1 tablet at 06/20/23 0754   diphenhydrAMINE (BENADRYL) capsule 50 mg  50 mg Oral TID PRN Bing Neighbors, NP       Or   diphenhydrAMINE (BENADRYL) injection 50 mg  50 mg Intramuscular TID PRN Bing Neighbors, NP       haloperidol (HALDOL) tablet 5 mg  5 mg Oral TID PRN Bing Neighbors, NP       Or   haloperidol lactate (HALDOL) injection 5 mg  5 mg Intramuscular TID PRN Bing Neighbors, NP       hydrOXYzine (ATARAX) tablet 25 mg  25 mg Oral TID PRN Bing Neighbors, NP   25 mg at 06/17/23 2319   levothyroxine (SYNTHROID) tablet 112 mcg  112 mcg Oral Q0600 Bing Neighbors, NP   112 mcg at 06/20/23 1610   LORazepam (ATIVAN) tablet 2 mg  2 mg Oral  TID PRN Bing Neighbors, NP       Or   LORazepam (ATIVAN) injection 2 mg  2 mg Intramuscular TID PRN Bing Neighbors, NP       magnesium hydroxide (MILK OF MAGNESIA) suspension 30 mL  30 mL Oral Daily PRN Bing Neighbors, NP       magnesium hydroxide (MILK OF MAGNESIA) suspension 30 mL  30 mL Oral Daily PRN Bing Neighbors, NP       traZODone (DESYREL) tablet 100 mg  100 mg Oral QHS PRN Sarita Bottom, MD   100 mg at 06/19/23 2117    Lab Results:  Results for orders placed or performed during the hospital encounter of 06/17/23 (from the past 48 hour(s))  CBC with Differential/Platelet     Status: Abnormal   Collection Time: 06/19/23  6:57 AM  Result Value Ref Range   WBC 11.2 (H) 4.0 - 10.5 K/uL   RBC 4.52 3.87 - 5.11 MIL/uL  Hemoglobin 12.3 12.0 - 15.0 g/dL   HCT 09.8 11.9 - 14.7 %   MCV 89.6 80.0 - 100.0 fL   MCH 27.2 26.0 - 34.0 pg   MCHC 30.4 30.0 - 36.0 g/dL   RDW 82.9 (H) 56.2 - 13.0 %   Platelets 284 150 - 400 K/uL   nRBC 0.0 0.0 - 0.2 %   Neutrophils Relative % 58 %   Neutro Abs 6.6 1.7 - 7.7 K/uL   Lymphocytes Relative 32 %   Lymphs Abs 3.6 0.7 - 4.0 K/uL   Monocytes Relative 7 %   Monocytes Absolute 0.7 0.1 - 1.0 K/uL   Eosinophils Relative 2 %   Eosinophils Absolute 0.2 0.0 - 0.5 K/uL   Basophils Relative 1 %   Basophils Absolute 0.1 0.0 - 0.1 K/uL   Immature Granulocytes 0 %   Abs Immature Granulocytes 0.03 0.00 - 0.07 K/uL    Comment: Performed at [facility], [address], [place], [place] [address]  Lipid panel     Status: None   Collection Time: 06/19/23  6:57 AM  Result Value Ref Range   Cholesterol 114 0 - 200 mg/dL   Triglycerides 43 <469 mg/dL   HDL 58 >62 mg/dL   Total CHOL/HDL Ratio 2.0 RATIO   VLDL 9 0 - 40 mg/dL   LDL Cholesterol 47 0 - 99 mg/dL    Comment:        Total Cholesterol/HDL:CHD Risk Coronary Heart Disease Risk Table                     Men   Women  1/2 Average Risk   3.4   3.3  Average Risk        5.0   4.4  2 X Average Risk   9.6   7.1  3 X Average Risk  23.4   11.0        Use the calculated Patient Ratio above and the CHD Risk Table to determine the patient's CHD Risk.        ATP III CLASSIFICATION (LDL):  <100     mg/dL   Optimal  952-841  mg/dL   Near or Above                    Optimal  130-159  mg/dL   Borderline  324-401  mg/dL   High  >027     mg/dL   Very High Performed at [facility], [address], [place], [place] [address]     Blood Alcohol level:  No results found for: "ETH"  Metabolic Disorder Labs: Lab Results  Component Value Date   HGBA1C 5.6 06/17/2023   MPG 114.02 06/17/2023   MPG 117 01/15/2023   No results found for: "PROLACTIN" Lab Results  Component Value Date   CHOL 114 06/19/2023   TRIG 43 06/19/2023   HDL 58 06/19/2023   CHOLHDL 2.0 06/19/2023   VLDL 9 06/19/2023   LDLCALC 47 06/19/2023   LDLCALC 48 06/17/2023    Physical Findings: AIMS: Facial and Oral Movements Muscles of Facial Expression: None, normal Lips and Perioral Area: None, normal Jaw: None, normal Tongue: None, normal,Extremity Movements Upper (arms, wrists, hands, fingers): None, normal Lower (legs, knees, ankles, toes): None, normal, Trunk Movements Neck, shoulders, hips: None, normal, Overall Severity Severity of abnormal movements (highest score from questions above): None, normal Incapacitation due to abnormal movements: None, normal Patient's awareness of abnormal movements (rate  only patient's report): No Awareness, Dental Status Current problems with teeth and/or dentures?: No Does patient usually wear dentures?: No  CIWA:    COWS:     Musculoskeletal: Strength & Muscle Tone: within normal limits Gait & Station: normal Patient leans: N/A  Psychiatric Specialty Exam:  General Appearance: Appears at stated age, dressed in hospital gown   Behavior: Withdrawn and guarded but calm in general, seems more fluent   Psychomotor  Activity: Mild to moderate psychomotor retardation noted, no agitation   Eye Contact: Improved yet limited Speech: Still decreased amount, tone and volume but improved since yesterday, minimal delay compared to time of admission     Mood: Moderately dysphoric Affect: Sad flat affect, slightly better   Thought Process: Linear and goal-directed Descriptions of Associations: Intact yet concrete Thought Content: Hallucinations: Denies AH, VH, does not appear responding to stimuli Delusions: No paranoia  Suicidal Thoughts: Admits to passive SI wishing self dead but denies active SI, intention, plan, agrees to contract for safety in the hospital Homicidal Thoughts: Denies HI, intention, plan    Alertness/Orientation: Alert and fully oriented   Insight: Improved yet Limited Judgment: Improved yet Limited   Memory: Chief Executive Officer  Concentration: Fair Attention Span: Fair Recall: [employer] of Knowledge: Fair    Assets  Assets: Housing; Health and safety inspector; Physical Health; Social Support    Physical Exam: Physical Exam Vitals and nursing note reviewed.    Review of Systems  All other systems reviewed and are negative.  Blood pressure 106/76, pulse (!) 119, temperature 98.2 F (36.8 C), temperature source Oral, resp. rate 17, height 5\' 9"  (1.753 m), weight 101.9 kg, SpO2 100%. Body mass index is 33.17 kg/m.   Treatment Plan Summary: Daily contact with patient to assess and evaluate symptoms and progress in treatment and Medication management  ASSESSMENT:  Diagnoses / Active Problems: Principal Problem: MDD (major depressive disorder), recurrent severe, without psychosis (HCC) Diagnosis: Principal Problem:   MDD (major depressive disorder), recurrent severe, without psychosis (HCC) Active Problems:   Anxiety disorder, unspecified   PLAN: Safety and Monitoring:  -- Voluntary admission to inpatient psychiatric unit for safety, stabilization  and treatment  -- Daily contact with patient to assess and evaluate symptoms and progress in treatment  -- Patient's case to be discussed in multi-disciplinary team meeting  -- Observation Level : q15 minute checks  -- Vital signs:  q12 hours  -- Precautions: suicide, elopement, and assault  2.  Continue home medication Auvelity 45/105 mg twice daily for depression, monitor efficacy and safety and adjust accordingly, noncompliance was reported at home            Continue home medication Abilify 10 mg daily for mood and depression, noncompliance was reported at home            Continue home medication Synthroid 112 mcg for hypothyroidism            Continue trazodone 100 mg at bedtime as needed for sleep             Continue Atarax 25 mg 3 times daily as needed for anxiety      The risks/benefits/side-effects/alternatives to this medication were discussed in detail with the patient and time was given for questions. The patient consents to medication trial.                -- Metabolic profile and EKG monitoring obtained while on an atypical antipsychotic (BMI: Lipid Panel: HbgA1c: QTc:)  3. Labs Reviewed: CMP no significant abnormalities noted CBC no significant abnormalities noted except for slightly elevated blood blood cell count, hemoglobin A1c 5.6, urinalysis no UTI, pregnancy test negative, TSH within normal level, UDS negative. CBC 10/5, white blood cell count remains slightly elevated 11.2 Lipid panel 10/5 within normal level     Lab ordered: None   4. Group and Therapy: -- Encouraged patient to participate in unit milieu and in scheduled group therapies   5. Discharge Planning:              -- Social work and case management to assist with discharge planning and identification of hospital follow-up needs prior to discharge             -- Estimated LOS: 5-7 days             -- Discharge Concerns: Need to establish a safety plan; Medication compliance and  effectiveness             -- Discharge Goals: Return home with outpatient referrals for mental health follow-up including medication management/psychotherapy     The patient is agreeable with the medication plan, as above. We will monitor the patient's response to pharmacologic treatment, and adjust medications as necessary. Patient is encouraged to participate in group therapy while admitted to the psychiatric unit. We will address other chronic and acute stressors, which contributed to the patient's worsening depression and SI, in order to reduce the risk of self-harm at discharge.     Physician Treatment Plan for Primary Diagnosis: MDD (major depressive disorder), recurrent severe, without psychosis (HCC) Long Term Goal(s): Improvement in symptoms so as ready for discharge   Short Term Goals: Ability to identify changes in lifestyle to reduce recurrence of condition will improve, Ability to verbalize feelings will improve, Ability to disclose and discuss suicidal ideas, and Ability to demonstrate self-control will improve     I certify that inpatient services furnished can reasonably be expected to improve the patient's condition.       Total Time Spent in Direct Patient Care:  I personally spent 35 minutes on the unit in direct patient care. The direct patient care time included face-to-face time with the patient, reviewing the patient's chart, communicating with other professionals, and coordinating care. Greater than 50% of this time was spent in counseling or coordinating care with the patient regarding goals of hospitalization, psycho-education, and discharge planning needs.   Rudolpho Claxton Abbott Pao, MD 06/20/2023, 9:55 AM

## 2023-06-20 NOTE — Group Note (Unsigned)
Date:  06/20/2023 Time:  2:06 PM  Group Topic/Focus:  Goals Group:   The focus of this group is to help patients establish daily goals to achieve during treatment and discuss how the patient can incorporate goal setting into their daily lives to aide in recovery.     Participation Level:  {B[facility] PARTICIPATION WGNFA:21308}  Participation Quality:  {B[facility] PARTICIPATION QUALITY:22265}  Affect:  {BHH AFFECT:22266}  Cognitive:  {B[facility] COGNITIVE:22267}  Insight: {BHH Insight2:20797}  Engagement in Group:  {BHH ENGAGEMENT IN MVHQI:69629}  Modes of Intervention:  {BHH MODES OF INTERVENTION:22269}  Additional Comments:  ***  Beckie Busing 06/20/2023, 2:06 PM

## 2023-06-20 NOTE — Plan of Care (Signed)
  Problem: Education: Goal: Emotional status will improve Outcome: Progressing   

## 2023-06-20 NOTE — B[facility] Group Notes (Signed)
Participation Level:  Active   Description of Group:   In this group, patients shared and discussed the importance of acknowledging the elements in their lives for which they are grateful and how this can positively impact their mood.  The group discussed how bringing the positive elements of their lives to the forefront of their minds can help with recovery from any illness, physical or mental.  An exercise was done as a group in which a list was made of gratitude items in order to encourage participants to consider other potential positives in their lives.  Therapeutic Goals: Patients will identify one or more item for which they are grateful in each of 6 categories:  people, experiences, things, places, skills, and other. Patients will discuss how it is possible to seek out gratitude in even bad situations. Patients will explore other possible items of gratitude that they could remember.   Summary of Patient Progress:  The patient shared that she is grateful for family.  Patient's reaction to the group was insightful and reflective.  Therapeutic Modalities:   Solution-Focused Therapy Activity

## 2023-06-20 NOTE — Progress Notes (Signed)
Patient signed a 72 hour request for discharge 06/20/23 @ 1330

## 2023-06-20 NOTE — Progress Notes (Signed)
   06/19/23 2120  Psych Admission Type (Psych Patients Only)  Admission Status Voluntary  Psychosocial Assessment  Patient Complaints Depression;Hopelessness  Eye Contact Brief  Facial Expression Sad  Affect Anxious  Speech Logical/coherent  Interaction Guarded;Minimal  Motor Activity Slow  Appearance/Hygiene Unremarkable  Behavior Characteristics Cooperative;Calm  Mood Depressed  Aggressive Behavior  Effect No apparent injury  Thought Process  Coherency WDL  Content WDL  Delusions None reported or observed  Perception WDL  Hallucination None reported or observed  Judgment WDL  Confusion WDL  Danger to Self  Current suicidal ideation? Denies  Agreement Not to Harm Self Yes  Description of Agreement Verbal  Danger to Others  Danger to Others None reported or observed

## 2023-06-21 DIAGNOSIS — F332 Major depressive disorder, recurrent severe without psychotic features: Secondary | ICD-10-CM | POA: Diagnosis not present

## 2023-06-21 NOTE — B[facility] Group Notes (Signed)
[facility] Group Notes:  (Nursing/MHT/Case Management/Adjunct)  Date:  06/21/2023  Time:  9:42 PM  Type of Therapy:   Wrap-up group  Participation Level:  Active  Participation Quality:  Appropriate  Affect:  Appropriate  Cognitive:  Appropriate  Insight:  Appropriate  Engagement in Group:  Engaged  Modes of Intervention:  Education  Summary of Progress/Problems: Pt goal to get D/C date. Rated day 9/10.   Noah Delaine 06/21/2023, 9:42 PM

## 2023-06-21 NOTE — Plan of Care (Signed)
°  Problem: Education: °Goal: Emotional status will improve °Outcome: Progressing °Goal: Mental status will improve °Outcome: Progressing °  °Problem: Activity: °Goal: Interest or engagement in activities will improve °Outcome: Progressing °  °

## 2023-06-21 NOTE — Progress Notes (Signed)
   06/20/23 2150  Psych Admission Type (Psych Patients Only)  Admission Status Voluntary/72 hour document signed  Date 72 hour document signed  06/20/23  Time 72 hour document signed  1330  Provider Notified (First and Last Name) (see details for LINK to note) Joyce [name]  Psychosocial Assessment  Patient Complaints Anxiety;Depression  Eye Contact Brief  Facial Expression Sad  Affect Sad  Speech Logical/coherent  Interaction Guarded  Motor Activity Slow  Appearance/Hygiene Unremarkable  Behavior Characteristics Cooperative;Calm  Mood Depressed  Aggressive Behavior  Effect No apparent injury  Thought Process  Coherency WDL  Content WDL  Delusions None reported or observed  Perception WDL  Hallucination None reported or observed  Judgment WDL  Confusion None  Danger to Self  Current suicidal ideation? Denies  Agreement Not to Harm Self Yes  Description of Agreement verbal  Danger to Others  Danger to Others None reported or observed

## 2023-06-21 NOTE — Progress Notes (Addendum)
Pt denied SI/HI/AVH this morning. Pt denied having anxiety or depression this morning. Pt reports that she is ready to be discharged and would like to talk with the doctor about when that will be. MD notified. Pt has been pleasant, calm, and cooperative throughout the shift. RN provided support and encouragement to patient. Pt given scheduled medications as prescribed. Q15 min checks verified for safety. Patient verbally contracts for safety. Patient compliant with medications and treatment plan. Patient is interacting well on the unit. Pt is safe on the unit.   06/21/23 0842  Psych Admission Type (Psych Patients Only)  Admission Status Voluntary/72 hour document signed  Psychosocial Assessment  Patient Complaints None  Eye Contact Brief  Facial Expression Flat  Affect Sad  Speech Logical/coherent  Interaction Guarded  Motor Activity Slow  Appearance/Hygiene Unremarkable  Behavior Characteristics Cooperative;Calm  Mood Depressed  Thought Process  Coherency WDL  Content WDL  Delusions None reported or observed  Perception WDL  Hallucination None reported or observed  Judgment Impaired  Confusion None  Danger to Self  Current suicidal ideation? Denies  Description of Suicide Plan No plan  Self-Injurious Behavior No self-injurious ideation or behavior indicators observed or expressed   Agreement Not to Harm Self Yes  Description of Agreement Verbal  Danger to Others  Danger to Others None reported or observed

## 2023-06-21 NOTE — Progress Notes (Signed)
[facility] MD Progress Note  06/21/2023 10:16 AM Joyce [name]  MRN:  [id number]   Reason for Admission:  Joyce [name] is a 22 y.o., female with a past psychiatric history significant for MDD, anxiety who presents to the [facility] from behavioral health urgent care for evaluation and management of worsening depression and SI.  According to outside records, the patient patient presented to behavioral health urgent care building reporting worsening depression and active SI with a plan to overdose on medications.The patient is currently on Hospital Day 4.   Chart Review from last 24 hours:  The patient's chart was reviewed and nursing notes were reviewed. The patient's case was discussed in multidisciplinary team meeting. Per [facility] patient is compliant with her medications on the unit including dextromethorphan/bupropion twice daily starting this morning, also compliant with Abilify 10 mg daily since yesterday and Synthroid 112 mcg daily.  As needed Atarax 25 mg was used as needed 10/3 at 11 PM and 10/6 at 9 PM, as needed trazodone 100 mg was used on 10/5 and 10/6  Information Obtained Today During Patient Interview: The patient was seen and evaluated on the unit.  Patient presents with better eye contact, more fluent speech and improved affect, she was reported by staff to be observed very interactive with other peers and occasionally smiling.  Patient reports had a good day yesterday and in general she describes feeling "better" she notes taking her medications on regular basis is helpful and she identifies very improved depressed mood skinning her depressed mood 2 out of 1010 being the worst, denies anxiety, denies passive or active SI intention or plan reports last time had passive SI was at least 2 to 3 days ago.  She denies HI or AVH, she reports fair sleep and appetite.  She is able to identify importance of compliance with medication after discharge, she signed 72 hours discharge  request yesterday and it was explained to her that that we will continue to monitor her and assess discharge planning.  She reports she had a phone call with her grandmother and mother but they did not visit.  She was encouraged to have her family visit her tonight if possible for them to assist staff to assess improvement and baseline approach and discuss further regarding discharge plan. Sleep  Improved sleep reported  Principal Problem: MDD (major depressive disorder), recurrent severe, without psychosis (HCC) Diagnosis: Principal Problem:   MDD (major depressive disorder), recurrent severe, without psychosis (HCC) Active Problems:   Anxiety disorder, unspecified    Past Psychiatric History:  Prior Psychiatric diagnoses: Depression and anxiety Past Psychiatric Hospitalizations: Emergency room visit for increased depression once in 2022 but no psychiatric hospitalization   History of self mutilation: None Past suicide attempts: None Past history of HI, violent or aggressive behavior: None   Past Psychiatric medications trials: Per chart review Prozac was tried in the past History of ECT/TMS: None   Outpatient psychiatric Follow up: Follows up at [facility] Prior Outpatient Therapy: Father reports patient has been having inconsistent outpatient therapy, recently [facility], so the counselor they are only once with plan to continue there after discharge  Past Medical History:  Past Medical History:  Diagnosis Date   Constipation    Hypopituitarism (HCC)    Left foot drop    Pre-diabetes    Thyroid disease     Past Surgical History:  Procedure Laterality Date   CHOLECYSTECTOMY N/A 01/14/2022   Procedure: LAPAROSCOPIC CHOLECYSTECTOMY;  Surgeon:  Stechschulte, Hyman Hopes, MD;  Location: [facility] OR;  Service: General;  Laterality: N/A;   Family History:  Family History  Problem Relation Age of Onset   Anxiety disorder Mother    Depression Mother    Hypertension Mother     Depression Father    Healthy Father    Drug abuse Brother    Hypertension Paternal Grandmother    Diabetes Paternal Grandmother    Family Psychiatric  History:  Psychiatric illness: Father and mother both have depression Suicide: Per patient's report father attempted suicide once Substance Abuse: None reported Social History:  Living situation: [place] her [place] in [place] Social support: Parents are supportive Marital Status: Never married Children: No children Education: 1 year of college, quit for financial problems 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  Current Medications: Current Facility-Administered Medications  Medication Dose Route Frequency Provider Last Rate Last Admin   acetaminophen (TYLENOL) tablet 650 mg  650 mg Oral Q6H PRN Bing Neighbors, NP       acetaminophen (TYLENOL) tablet 650 mg  650 mg Oral Q6H PRN Bing Neighbors, NP       alum & mag hydroxide-simeth (MAALOX/MYLANTA) 200-200-20 MG/5ML suspension 30 mL  30 mL Oral Q4H PRN Bing Neighbors, NP       alum & mag hydroxide-simeth (MAALOX/MYLANTA) 200-200-20 MG/5ML suspension 30 mL  30 mL Oral Q4H PRN Bing Neighbors, NP       ARIPiprazole (ABILIFY) tablet 10 mg  10 mg Oral Daily Bing Neighbors, NP   10 mg at 06/21/23 0737   Dextromethorphan-buPROPion ER 45-105 MG TBCR 1 tablet  1 tablet Oral BID Bing Neighbors, NP   1 tablet at 06/21/23 8295   diphenhydrAMINE (BENADRYL) capsule 50 mg  50 mg Oral TID PRN Bing Neighbors, NP       Or   diphenhydrAMINE (BENADRYL) injection 50 mg  50 mg Intramuscular TID PRN Bing Neighbors, NP       haloperidol (HALDOL) tablet 5 mg  5 mg Oral TID PRN Bing Neighbors, NP       Or   haloperidol lactate (HALDOL) injection 5 mg  5 mg  Intramuscular TID PRN Bing Neighbors, NP       hydrOXYzine (ATARAX) tablet 25 mg  25 mg Oral TID PRN Bing Neighbors, NP   25 mg at 06/20/23 2124   levothyroxine (SYNTHROID) tablet 112 mcg  112 mcg Oral Q0600 Bing Neighbors, NP   112 mcg at 06/21/23 6213   LORazepam (ATIVAN) tablet 2 mg  2 mg Oral TID PRN Bing Neighbors, NP       Or   LORazepam (ATIVAN) injection 2 mg  2 mg Intramuscular TID PRN Bing Neighbors, NP       magnesium hydroxide (MILK OF MAGNESIA) suspension 30 mL  30 mL Oral Daily PRN Bing Neighbors, NP       magnesium hydroxide (MILK OF MAGNESIA) suspension 30 mL  30 mL Oral Daily PRN Bing Neighbors, NP       traZODone (DESYREL) tablet 100 mg  100 mg Oral QHS PRN Sarita Bottom, MD   100 mg at 06/20/23 2124    Lab Results:  No results found for this or any previous  visit (from the past 48 hour(s)).   Blood Alcohol level:  No results found for: "ETH"  Metabolic Disorder Labs: Lab Results  Component Value Date   HGBA1C 5.6 06/17/2023   MPG 114.02 06/17/2023   MPG 117 01/15/2023   No results found for: "PROLACTIN" Lab Results  Component Value Date   CHOL 114 06/19/2023   TRIG 43 06/19/2023   HDL 58 06/19/2023   CHOLHDL 2.0 06/19/2023   VLDL 9 06/19/2023   LDLCALC 47 06/19/2023   LDLCALC 48 06/17/2023    Physical Findings: AIMS: Facial and Oral Movements Muscles of Facial Expression: None, normal Lips and Perioral Area: None, normal Jaw: None, normal Tongue: None, normal,Extremity Movements Upper (arms, wrists, hands, fingers): None, normal Lower (legs, knees, ankles, toes): None, normal, Trunk Movements Neck, shoulders, hips: None, normal, Overall Severity Severity of abnormal movements (highest score from questions above): None, normal Incapacitation due to abnormal movements: None, normal Patient's awareness of abnormal movements (rate only patient's report): No Awareness, Dental Status Current problems with teeth and/or  dentures?: No Does patient usually wear dentures?: No  CIWA:    COWS:     Musculoskeletal: Strength & Muscle Tone: within normal limits Gait & Station: normal Patient leans: N/A  Psychiatric Specialty Exam:  General Appearance: Appears at stated age, dressed in hospital gown   Behavior: Much less withdrawn, more cooperative   Psychomotor Activity: Slight psychomotor retardation if any, no psychomotor agitation noted   Eye Contact: Improved, fair Speech: Improved amount, tone and volume, much better fluent speech today     Mood: Mildly dysphoric, improved Affect: Restricted sad affect, improved   Thought Process: Linear and goal-directed Descriptions of Associations: Intact  Thought Content: Hallucinations: Denies AH, VH, does not appear responding to stimuli Delusions: No paranoia  Suicidal Thoughts: Denies passive or active SI, intention, plan Homicidal Thoughts: Denies HI, intention, plan    Alertness/Orientation: Alert and fully oriented   Insight: Improved yet Limited Judgment: Improved yet Limited   Memory: Fair   Art therapist  Concentration: Fair Attention Span: Fair Recall: [employer] of Knowledge: Fair    Assets  Assets: Housing; Health and safety inspector; Physical Health; Social Support    Physical Exam: Physical Exam Vitals and nursing note reviewed.    Review of Systems  All other systems reviewed and are negative.  Blood pressure 102/74, pulse 89, temperature 98.1 F (36.7 C), temperature source Oral, resp. rate 17, height 5\' 9"  (1.753 m), weight 101.9 kg, SpO2 100%. Body mass index is 33.17 kg/m.   Treatment Plan Summary: Daily contact with patient to assess and evaluate symptoms and progress in treatment and Medication management  ASSESSMENT:  Diagnoses / Active Problems: Principal Problem: MDD (major depressive disorder), recurrent severe, without psychosis (HCC) Diagnosis: Principal Problem:   MDD (major depressive  disorder), recurrent severe, without psychosis (HCC) Active Problems:   Anxiety disorder, unspecified   PLAN: Safety and Monitoring:  -- Voluntary admission to inpatient psychiatric unit for safety, stabilization and treatment  -- Daily contact with patient to assess and evaluate symptoms and progress in treatment  -- Patient's case to be discussed in multi-disciplinary team meeting  -- Observation Level : q15 minute checks  -- Vital signs:  q12 hours  -- Precautions: suicide, elopement, and assault  2.  Continue home medication Auvelity 45/105 mg twice daily for depression, monitor efficacy and safety and adjust accordingly, noncompliance was reported at home            Continue home medication Abilify 10  mg daily for mood and depression, noncompliance was reported at home            Continue home medication Synthroid 112 mcg for hypothyroidism            Continue trazodone 100 mg at bedtime as needed for sleep             Continue Atarax 25 mg 3 times daily as needed for anxiety      The risks/benefits/side-effects/alternatives to this medication were discussed in detail with the patient and time was given for questions. The patient consents to medication trial.                -- Metabolic profile and EKG monitoring obtained while on an atypical antipsychotic (BMI: Lipid Panel: HbgA1c: QTc:)                            3. Labs Reviewed: CMP no significant abnormalities noted CBC no significant abnormalities noted except for slightly elevated blood blood cell count, hemoglobin A1c 5.6, urinalysis no UTI, pregnancy test negative, TSH within normal level, UDS negative. CBC 10/5, white blood cell count remains slightly elevated 11.2 Lipid panel 10/5 within normal level     Lab ordered: None   4. Group and Therapy: -- Encouraged patient to participate in unit milieu and in scheduled group therapies   5. Discharge Planning:              -- Social work and case management to assist with  discharge planning and identification of hospital follow-up needs prior to discharge             -- Estimated LOS: 5-7 days             -- Discharge Concerns: Need to establish a safety plan; Medication compliance and effectiveness             -- Discharge Goals: Return home with outpatient referrals for mental health follow-up including medication management/psychotherapy     The patient is agreeable with the medication plan, as above. We will monitor the patient's response to pharmacologic treatment, and adjust medications as necessary. Patient is encouraged to participate in group therapy while admitted to the psychiatric unit. We will address other chronic and acute stressors, which contributed to the patient's worsening depression and SI, in order to reduce the risk of self-harm at discharge.     Physician Treatment Plan for Primary Diagnosis: MDD (major depressive disorder), recurrent severe, without psychosis (HCC) Long Term Goal(s): Improvement in symptoms so as ready for discharge   Short Term Goals: Ability to identify changes in lifestyle to reduce recurrence of condition will improve, Ability to verbalize feelings will improve, Ability to disclose and discuss suicidal ideas, and Ability to demonstrate self-control will improve     I certify that inpatient services furnished can reasonably be expected to improve the patient's condition.       Total Time Spent in Direct Patient Care:  I personally spent 35 minutes on the unit in direct patient care. The direct patient care time included face-to-face time with the patient, reviewing the patient's chart, communicating with other professionals, and coordinating care. Greater than 50% of this time was spent in counseling or coordinating care with the patient regarding goals of hospitalization, psycho-education, and discharge planning needs.   Jeniece Hannis Abbott Pao, MD 06/21/2023, 10:16 AM

## 2023-06-21 NOTE — Group Note (Signed)
Occupational Therapy Group Note  Group Topic:Coping Skills  Group Date: 06/21/2023 Start Time: 1430 End Time: 1500 Facilitators: Ted Mcalpine, OT   Group Description: Group encouraged increased engagement and participation through discussion and activity focused on "Coping Ahead." Patients were split up into teams and selected a card from a stack of positive coping strategies. Patients were instructed to act out/charade the coping skill for other peers to guess and receive points for their team. Discussion followed with a focus on identifying additional positive coping strategies and patients shared how they were going to cope ahead over the weekend while continuing hospitalization stay.  Therapeutic Goal(s): Identify positive vs negative coping strategies. Identify coping skills to be used during hospitalization vs coping skills outside of hospital/at home Increase participation in therapeutic group environment and promote engagement in treatment   Participation Level: Minimal   Participation Quality: Independent   Behavior: Appropriate   Speech/Thought Process: Barely audible   Affect/Mood: Flat   Insight: Fair   Judgement: Fair      Modes of Intervention: Education  Patient Response to Interventions:  Attentive   Plan: Continue to engage patient in OT groups 2 - 3x/week.  06/21/2023  Ted Mcalpine, OT  Kerrin Champagne, OT

## 2023-06-21 NOTE — Progress Notes (Signed)
   06/21/23 0543  15 Minute Checks  Location Bedroom  Visual Appearance Calm  Behavior Sleeping  Sleep (Behavioral Health Patients Only)  Calculate sleep? (Click Yes once per 24 hr at 0600 safety check) Yes  Documented sleep last 24 hours 7.5

## 2023-06-21 NOTE — Group Note (Unsigned)
Date:  06/21/2023 Time:  1:59 AM  Group Topic/Focus:  Wrap-Up Group:   The focus of this group is to help patients review their daily goal of treatment and discuss progress on daily workbooks.    Participation Level:  Active  Participation Quality:  Appropriate and Sharing  Affect:  Appropriate  Cognitive:  Appropriate  Insight: Appropriate and Limited  Engagement in Group:  Engaged  Modes of Intervention:  Activity and Socialization  Additional Comments:  The patient stated that she had a good day. The patient shared that she spoke with her best friend on the phone and had a good conversation with her. The patient did not give much insight on day. The patient rated her day a 9.5/10. The patient participated in the group activity at the end of the group.   Joyce [name] 06/21/2023, 1:59 AM

## 2023-06-21 NOTE — Group Note (Signed)
Recreation Therapy Group Note   Group Topic:Stress Management  Group Date: 06/21/2023 Start Time: 1000 End Time: 1020 Facilitators: Liandro Thelin-McCall, LRT,CTRS Location: [address] Dayroom   Group Topic: Stress Management   Goal Area(s) Addresses:  Patient will actively participate in stress management techniques presented during session.  Patient will successfully identify benefit of practicing stress management post d/c.   Intervention: Relaxation exercise with ambient sound and script   Group Description: Guided Imagery. LRT provided education, instruction, and demonstration on practice of visualization via guided imagery. Patient was asked to participate in the technique introduced during session. LRT debriefed including topics of mindfulness, stress management and specific scenarios each patient could use these techniques. Patients were given suggestions of ways to access scripts post d/c and encouraged to explore Youtube and other apps available on smartphones, tablets, and computers.  Education:  Stress Management, Discharge Planning.   Education Outcome: Acknowledges education   Affect/Mood: Appropriate   Participation Level: Engaged   Participation Quality: Independent   Behavior: Appropriate   Speech/Thought Process: Focused   Insight: Good   Judgement: Good   Modes of Intervention: Script   Patient Response to Interventions:  Engaged   Education Outcome:  In group clarification offered    Clinical Observations/Individualized Feedback: Pt attended and participated in group session. Pt identified being in her room with a bunch of chinchillas as her peaceful place.    Plan: Continue to engage patient in RT group sessions 2-3x/week.   Sundai Probert-McCall, LRT,CTRS 06/21/2023 1:27 PM

## 2023-06-22 DIAGNOSIS — F332 Major depressive disorder, recurrent severe without psychotic features: Secondary | ICD-10-CM | POA: Diagnosis not present

## 2023-06-22 NOTE — Group Note (Signed)
LCSW Group Therapy Note   Group Date: 06/22/2023 Start Time: 1100 End Time: 1200  Type of Therapy and Topic:  Group Therapy: Are You Under Stress?!?!  Participation Level:  Active  Description of Group: This process group involved patients examining stress and how it impacts their lives. Distress and eustress definitions were explained and patients identified both good and bad stressors in their lives. Accompanying worksheet was used to help patients identify the physical and emotional symptoms of stress and techniques/copings skills they utilize to help reduce these symptoms.    Therapeutic Goals:  1.  Patients will talk about their experiences with distress and eustress. 2. Patients will identify stressors in their lives and the physical and emotional  symptoms they experience while under stress. 3. Patients to identify actions/coping skills that they utilize to help ameliorate  symptoms of stress.  Summary of Patient Progress:   Pt attended group  Therapeutic Modalities:  Cognitive Behavioral Therapy Solution-Focused Therapy  Izell  LCSW 06/22/2023 2:45 PM

## 2023-06-22 NOTE — [facility] Group Notes (Signed)
[facility] Group Notes:  (Nursing/MHT/Case Management/Adjunct)  Date:  06/22/2023  Time:  2:05 PM  Type of Therapy:  Psychoeducational Skills  Participation Level:  Active  Participation Quality:  Appropriate  Affect:  Appropriate  Cognitive:  Alert and Appropriate  Insight:  Appropriate  Engagement in Group:  Engaged  Modes of Intervention:  Discussion, Education, and Exploration  Summary of Progress/Problems:  Psychoeducational group discussing recognizing mental decompensation warning signs and how to implement healthy coping skills to promote mental wellbeing. Pt attended and was appropriate.   Joyce [name] 06/22/2023, 2:05 PM

## 2023-06-22 NOTE — Progress Notes (Signed)
D: Patient is alert, oriented, and cooperative. Denies SI, HI, AVH, and verbally contracts for safety. Patient reports she slept fair last night with sleeping medication. Patient reports her appetite as good, energy level as normal, and concentration as good. Patient rates her depression 1/10, hopelessness 1/10, and anxiety 1/10. Patient denies physical symptoms/pain.    A: Scheduled medications administered per MD order. Support provided. Patient educated on safety on the unit and medications. Routine safety checks every 15 minutes. Patient stated understanding to tell nurse about any new physical symptoms. Patient understands to tell staff of any needs.     R: No adverse drug reactions noted. Patient remains safe at this time and will continue to monitor.    06/22/23 1000  Psych Admission Type (Psych Patients Only)  Admission Status Voluntary/72 hour document signed  Date 72 hour document signed  06/20/23  Time 72 hour document signed  1330  Psychosocial Assessment  Patient Complaints None  Eye Contact Brief  Facial Expression Flat  Affect Sad  Speech Logical/coherent  Interaction Guarded  Motor Activity Slow  Appearance/Hygiene Unremarkable  Behavior Characteristics Cooperative;Calm  Mood Depressed  Thought Process  Coherency WDL  Content WDL  Delusions None reported or observed  Perception WDL  Hallucination None reported or observed  Judgment Impaired  Confusion None  Danger to Self  Current suicidal ideation? Denies  Self-Injurious Behavior No self-injurious ideation or behavior indicators observed or expressed   Agreement Not to Harm Self Yes  Description of Agreement verbal  Danger to Others  Danger to Others None reported or observed

## 2023-06-22 NOTE — [facility] Group Notes (Signed)
The focus of this group is to help patients establish daily goals to achieve during treatment and discuss how the patient can incorporate goal setting into their daily lives to aide in recovery.    Scale 1 to 10:  1    Goal:  Discharge planning

## 2023-06-22 NOTE — Progress Notes (Signed)
   06/21/23 2301  Psych Admission Type (Psych Patients Only)  Admission Status Voluntary/72 hour document signed  Psychosocial Assessment  Patient Complaints Depression  Eye Contact Brief  Facial Expression Flat  Affect Sad  Speech Logical/coherent  Interaction Guarded  Motor Activity Slow  Appearance/Hygiene Unremarkable  Behavior Characteristics Cooperative  Mood Depressed  Thought Process  Coherency WDL  Content WDL  Delusions None reported or observed  Perception WDL  Hallucination None reported or observed  Judgment Impaired  Confusion None  Danger to Self  Current suicidal ideation? Denies  Agreement Not to Harm Self Yes  Description of Agreement verbal  Danger to Others  Danger to Others None reported or observed

## 2023-06-22 NOTE — Progress Notes (Signed)
[facility] MD Progress Note  06/22/2023 2:31 PM Joyce [name]  MRN:  [id number] Subjective:   Joyce [name] was seen today in her room on rounds. She feels that she is going to be ready for discharge tomorrow and does not want to stay for further evaluation and treatment. She has no complaints of medications side effects. She enjoyed the pet therapy group most. She denies hallucinations, thoughts of harm to self or others. She plans to return to home. She feels that most of her issues with coping and independence stem from her parents.   Principal Problem: MDD (major depressive disorder), recurrent severe, without psychosis (HCC) Diagnosis: Principal Problem:   MDD (major depressive disorder), recurrent severe, without psychosis (HCC) Active Problems:   Anxiety disorder, unspecified  Total Time spent with patient: 20 minutes  Past Psychiatric History: Prior Psychiatric diagnoses: Depression and anxiety Past Psychiatric Hospitalizations: Emergency room visit for increased depression once in 2022 but no psychiatric hospitalization   History of self mutilation: None Past suicide attempts: None Past history of HI, violent or aggressive behavior: None   Past Psychiatric medications trials: Per chart review Prozac was tried in the past History of ECT/TMS: None   Outpatient psychiatric Follow up: Follows up at [facility] Prior Outpatient Therapy: Father reports patient has been having inconsistent outpatient therapy, recently [facility], so the counselor they are only once with plan to continue there after discharge  Past Medical History:  Past Medical History:  Diagnosis Date   Constipation    Hypopituitarism (HCC)    Left foot drop    Pre-diabetes    Thyroid disease     Past Surgical History:  Procedure Laterality Date   CHOLECYSTECTOMY N/A 01/14/2022   Procedure: LAPAROSCOPIC CHOLECYSTECTOMY;  Surgeon: Stechschulte, Hyman Hopes, MD;  Location: [facility] OR;  Service: General;   Laterality: N/A;   Family History:  Family History  Problem Relation Age of Onset   Anxiety disorder Mother    Depression Mother    Hypertension Mother    Depression Father    Healthy Father    Drug abuse Brother    Hypertension Paternal Grandmother    Diabetes Paternal Grandmother    Family Psychiatric  History: Psychiatric illness: Father and mother both have depression Suicide: Per patient's report father attempted suicide once Substance Abuse: None reported Social History:  Social History   Substance and Sexual Activity  Alcohol Use Not Currently     Social History   Substance and Sexual Activity  Drug Use Never    Social History   Socioeconomic History   Marital status: Single    Spouse name: Not on file   Number of children: 0   Years of education: college student now   [employer] education level: Not on file  Occupational History   Occupation: Consulting civil engineer    Comment: Works at [employer]  Tobacco Use   Smoking status: Never   Smokeless tobacco: Never  Vaping Use   Vaping status: Never Used  Substance and Sexual Activity   Alcohol use: Not Currently   Drug use: Never   Sexual activity: Never  Other Topics Concern   Not on file  Social History Narrative   Lives at home with parents.   Right-handed.   No daily caffeine use.   Loves to draw, and write stories.    Social Determinants of Health   Financial Resource Strain: Not on file  Food Insecurity: No Food Insecurity (06/17/2023)   Hunger Vital Sign  Worried About Programme researcher, broadcasting/film/video in the Last Year: Never true    Ran Out of Food in the Last Year: Never true  Transportation Needs: No Transportation Needs (06/17/2023)   PRAPARE - Administrator, Civil Service (Medical): No    Lack of Transportation (Non-Medical): No  Physical Activity: Not on file  Stress: Not on file  Social Connections: Not on file   Additional Social History:                         Sleep:  Fair  Appetite:  Fair  Current Medications: Current Facility-Administered Medications  Medication Dose Route Frequency Provider Last Rate Last Admin   acetaminophen (TYLENOL) tablet 650 mg  650 mg Oral Q6H PRN Bing Neighbors, NP       acetaminophen (TYLENOL) tablet 650 mg  650 mg Oral Q6H PRN Bing Neighbors, NP       alum & mag hydroxide-simeth (MAALOX/MYLANTA) 200-200-20 MG/5ML suspension 30 mL  30 mL Oral Q4H PRN Bing Neighbors, NP       alum & mag hydroxide-simeth (MAALOX/MYLANTA) 200-200-20 MG/5ML suspension 30 mL  30 mL Oral Q4H PRN Bing Neighbors, NP       ARIPiprazole (ABILIFY) tablet 10 mg  10 mg Oral Daily Bing Neighbors, NP   10 mg at 06/22/23 3875   Dextromethorphan-buPROPion ER 45-105 MG TBCR 1 tablet  1 tablet Oral BID Bing Neighbors, NP   1 tablet at 06/22/23 6433   diphenhydrAMINE (BENADRYL) capsule 50 mg  50 mg Oral TID PRN Bing Neighbors, NP       Or   diphenhydrAMINE (BENADRYL) injection 50 mg  50 mg Intramuscular TID PRN Bing Neighbors, NP       haloperidol (HALDOL) tablet 5 mg  5 mg Oral TID PRN Bing Neighbors, NP       Or   haloperidol lactate (HALDOL) injection 5 mg  5 mg Intramuscular TID PRN Bing Neighbors, NP       hydrOXYzine (ATARAX) tablet 25 mg  25 mg Oral TID PRN Bing Neighbors, NP   25 mg at 06/20/23 2124   levothyroxine (SYNTHROID) tablet 112 mcg  112 mcg Oral Q0600 Bing Neighbors, NP   112 mcg at 06/22/23 2951   LORazepam (ATIVAN) tablet 2 mg  2 mg Oral TID PRN Bing Neighbors, NP       Or   LORazepam (ATIVAN) injection 2 mg  2 mg Intramuscular TID PRN Bing Neighbors, NP       magnesium hydroxide (MILK OF MAGNESIA) suspension 30 mL  30 mL Oral Daily PRN Bing Neighbors, NP       magnesium hydroxide (MILK OF MAGNESIA) suspension 30 mL  30 mL Oral Daily PRN Bing Neighbors, NP       traZODone (DESYREL) tablet 100 mg  100 mg Oral QHS PRN Abbott Pao, Nadir, MD   100 mg at 06/21/23 2119    Lab Results:  No results found for this or any previous visit (from the past 48 hour(s)).  Blood Alcohol level:  No results found for: "ETH"  Metabolic Disorder Labs: Lab Results  Component Value Date   HGBA1C 5.6 06/17/2023   MPG 114.02 06/17/2023   MPG 117 01/15/2023   No results found for: "PROLACTIN" Lab Results  Component Value Date   CHOL 114 06/19/2023   TRIG 43 06/19/2023   HDL 58  06/19/2023   CHOLHDL 2.0 06/19/2023   VLDL 9 06/19/2023   LDLCALC 47 06/19/2023   LDLCALC 48 06/17/2023    Physical Findings: AIMS: Facial and Oral Movements Muscles of Facial Expression: None Lips and Perioral Area: None Jaw: None Tongue: None,Extremity Movements Upper (arms, wrists, hands, fingers): None Lower (legs, knees, ankles, toes): None, Trunk Movements Neck, shoulders, hips: None, Global Judgements Severity of abnormal movements overall : None Incapacitation due to abnormal movements: None Patient's awareness of abnormal movements: No Awareness, Dental Status Current problems with teeth and/or dentures?: No Does patient usually wear dentures?: No  CIWA:    COWS:     Musculoskeletal: Strength & Muscle Tone: within normal limits Gait & Station: normal Patient leans: N/A  Psychiatric Specialty Exam:  Presentation  General Appearance:  Appropriate for Environment  Eye Contact: Fair  Speech: Normal Rate  Speech Volume: Normal  Handedness: Right   Mood and Affect  Mood: Anxious  Affect: Appropriate   Thought Process  Thought Processes: Linear  Descriptions of Associations:Intact  Orientation:Full (Time, Place and Person)  Thought Content:Logical  History of Schizophrenia/Schizoaffective disorder:No  Duration of Psychotic Symptoms:No data recorded Hallucinations:Hallucinations: None  Ideas of Reference:None  Suicidal Thoughts:Suicidal Thoughts: No  Homicidal Thoughts:Homicidal Thoughts: No   Sensorium  Memory: Immediate Fair; Recent  Fair  Judgment: Fair  Insight: Fair   Art therapist  Concentration: Fair  Attention Span: Good  Recall: Good  Fund of Knowledge: Good  Language: Good   Psychomotor Activity  Psychomotor Activity: Psychomotor Activity: Normal   Assets  Assets: Communication Skills; Physical Health; Social Support   Sleep  Sleep: Sleep: Fair    Physical Exam: Physical Exam Vitals and nursing note reviewed.  HENT:     Head: Normocephalic and atraumatic.  Eyes:     Extraocular Movements: Extraocular movements intact.  Pulmonary:     Effort: Pulmonary effort is normal.  Musculoskeletal:        General: Normal range of motion.     Cervical back: Normal range of motion.  Neurological:     General: No focal deficit present.     Mental Status: She is alert and oriented to person, place, and time.  Psychiatric:        Behavior: Behavior normal.    Review of Systems  Constitutional:  Negative for fever.  Gastrointestinal:  Negative for constipation, diarrhea, nausea and vomiting.  Musculoskeletal:  Negative for myalgias.  Psychiatric/Behavioral:  Negative for hallucinations and suicidal ideas.    Blood pressure 107/68, pulse 92, temperature 98 F (36.7 C), temperature source Oral, resp. rate 17, height 5\' 9"  (1.753 m), weight 101.9 kg, SpO2 98%. Body mass index is 33.17 kg/m.   Treatment Plan Summary: Daily contact with patient to assess and evaluate symptoms and progress in treatment and Medication management   ASSESSMENT:   Diagnoses / Active Problems: Principal Problem: MDD (major depressive disorder), recurrent severe, without psychosis (HCC) Diagnosis: Principal Problem:   MDD (major depressive disorder), recurrent severe, without psychosis (HCC) Active Problems:   Anxiety disorder, unspecified     PLAN: Safety and Monitoring:             -- Voluntary admission to inpatient psychiatric unit for safety, stabilization and treatment             --  Daily contact with patient to assess and evaluate symptoms and progress in treatment             -- Patient's case to be discussed in multi-disciplinary  team meeting             -- Observation Level : q15 minute checks             -- Vital signs:  q12 hours             -- Precautions: suicide, elopement, and assault   2.  Continue home medication Auvelity 45/105 mg twice daily for depression, monitor efficacy and safety and adjust accordingly, noncompliance was reported at home            Continue home medication Abilify 10 mg daily for mood and depression, noncompliance was reported at home            Continue home medication Synthroid 112 mcg for hypothyroidism            Continue trazodone 100 mg at bedtime as needed for sleep             Continue Atarax 25 mg 3 times daily as needed for anxiety      The risks/benefits/side-effects/alternatives to this medication were discussed in detail with the patient and time was given for questions. The patient consents to medication trial.                -- Metabolic profile and EKG monitoring obtained while on an atypical antipsychotic (BMI: Lipid Panel: HbgA1c: QTc:)                            3. Labs Reviewed: n/a      Lab ordered: None   4. Group and Therapy: -- Encouraged patient to participate in unit milieu and in scheduled group therapies   5. Discharge Planning:              -- Social work and case management to assist with discharge planning and identification of hospital follow-up needs prior to discharge             -- Estimated LOS: 5-7 days             -- Discharge Concerns: Need to establish a safety plan; Medication compliance and effectiveness             -- Discharge Goals: Return home with outpatient referrals for mental health follow-up including medication management/psychotherapy     The patient is agreeable with the medication plan, as above. We will monitor the patient's response to pharmacologic treatment, and adjust  medications as necessary. Patient is encouraged to participate in group therapy while admitted to the psychiatric unit. We will address other chronic and acute stressors, which contributed to the patient's worsening depression and SI, in order to reduce the risk of self-harm at discharge.     Physician Treatment Plan for Primary Diagnosis: MDD (major depressive disorder), recurrent severe, without psychosis (HCC) Long Term Goal(s): Improvement in symptoms so as ready for discharge   Short Term Goals: Ability to identify changes in lifestyle to reduce recurrence of condition will improve, Ability to verbalize feelings will improve, Ability to disclose and discuss suicidal ideas, and Ability to demonstrate self-control will improve     I certify that inpatient services furnished can reasonably be expected to improve the patient's condition.    Roselle Locus, MD 06/22/2023, 2:31 PM

## 2023-06-22 NOTE — Group Note (Signed)
Recreation Therapy Group Note   Group Topic:Animal Assisted Therapy   Group Date: 06/22/2023 Start Time: 1610 End Time: 1035 Facilitators: Hallee Mckenny-McCall, LRT,CTRS Location: [address] Dayroom   Animal-Assisted Activity (AAA) Program Checklist/Progress Notes Patient Eligibility Criteria Checklist & Daily Group note for Rec Tx Intervention  AAA/T Program Assumption of Risk Form signed by Patient/ or Parent Legal Guardian Yes  Patient is free of allergies or severe asthma Yes  Patient reports no fear of animals Yes  Patient reports no history of cruelty to animals Yes  Patient understands his/her participation is voluntary Yes  Patient washes hands before animal contact Yes  Patient washes hands after animal contact Yes  Education: Hand Washing, Appropriate Animal Interaction   Education Outcome: Acknowledges education.    Affect/Mood: Appropriate   Participation Level: Engaged   Participation Quality: Independent   Behavior: Appropriate   Speech/Thought Process: Focused   Insight: Good   Judgement: Good   Modes of Intervention: Teaching laboratory technician   Patient Response to Interventions:  Engaged   Education Outcome:  In group clarification offered    Clinical Observations/Individualized Feedback:  Patient attended session and interacted appropriately with therapy dog and peers. Patient asked appropriate questions about therapy dog and his training. Patient shared stories about their pets at home with group.     Plan: Continue to engage patient in RT group sessions 2-3x/week.   Jabier Deese-McCall, LRT,CTRS 06/22/2023 1:42 PM

## 2023-06-23 ENCOUNTER — Other Ambulatory Visit: Payer: Self-pay | Admitting: Behavioral Health

## 2023-06-23 DIAGNOSIS — F331 Major depressive disorder, recurrent, moderate: Secondary | ICD-10-CM

## 2023-06-23 DIAGNOSIS — F332 Major depressive disorder, recurrent severe without psychotic features: Principal | ICD-10-CM

## 2023-06-23 DIAGNOSIS — F411 Generalized anxiety disorder: Secondary | ICD-10-CM

## 2023-06-23 MED ORDER — TRAZODONE HCL 100 MG PO TABS: 100.0000 mg | ORAL_TABLET | Freq: Every evening | ORAL | 0 refills | Status: DC | PRN

## 2023-06-23 MED ORDER — HYDROXYZINE HCL 25 MG PO TABS: 25.0000 mg | ORAL_TABLET | Freq: Three times a day (TID) | ORAL | 0 refills | Status: DC | PRN

## 2023-06-23 MED ORDER — ARIPIPRAZOLE 10 MG PO TABS: 10.0000 mg | ORAL_TABLET | Freq: Every day | ORAL | 0 refills | Status: DC

## 2023-06-23 MED ORDER — AUVELITY 45-105 MG PO TBCR: 1.0000 | EXTENDED_RELEASE_TABLET | Freq: Two times a day (BID) | ORAL | 0 refills | Status: DC

## 2023-06-23 NOTE — Discharge Summary (Signed)
Physician Discharge Summary Note  Patient:  Joyce [name] is an 22 y.o., female MRN:  [id number] DOB:  2001/02/10 Patient phone:  [phone] (home)  Patient address:   [address] [place] [facility] [address],  Total Time spent with patient: 20 minutes  Date of Admission:  06/17/2023 Date of Discharge: 06/23/2023  Reason for Admission:   History of Present Illness:  Joyce [name] is a 22 y.o., female with a past psychiatric history significant for MDD, anxiety who presents to the [facility] from behavioral health urgent care for evaluation and management of worsening depression and SI.  According to outside records, the patient patient presented to behavioral health urgent care building reporting worsening depression and active SI with a plan to overdose on medications.   Initial assessment on 06/18/2023, patient was evaluated on the inpatient unit, the patient reports diagnosed with depression about 2 years ago, reports worsening depressed mood over the past 2 to 3 weeks, reports active SI wanting to harm herself with a plan to overdose on medication this is started about 1 to 2 weeks ago and got more intense so her father took her to urgent care building for help.  She reports over the past 1 to 2 weeks decreased energy and motivation, fair appetite and concentration, admits to feeling hopeless helpless and worthless, she denies any current active SI intention or plan and able to contract for safety in the hospital but reports current passive SI wishing self dead.  She denies HI or AVH she denies paranoia or other delusions.  She denies symptoms consistent with mania or hypomania or PTSD.   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 but her parents did not feel it would be right for her, her father added over the phone call that she has been stressed out secondary to talking to someone on a dating app but "it did not work  well"  Principal Problem: MDD (major depressive disorder), recurrent severe, without psychosis (HCC) Discharge Diagnoses: Principal Problem:   MDD (major depressive disorder), recurrent severe, without psychosis (HCC) Active Problems:   Anxiety disorder, unspecified   Past Psychiatric History:  Prior Psychiatric diagnoses: Depression and anxiety Past Psychiatric Hospitalizations: Emergency room visit for increased depression once in 2022 but no psychiatric hospitalization   History of self mutilation: None Past suicide attempts: None Past history of HI, violent or aggressive behavior: None   Past Psychiatric medications trials: Per chart review Prozac was tried in the past History of ECT/TMS: None   Outpatient psychiatric Follow up: Follows up at [facility] Prior Outpatient Therapy: Father reports patient has been having inconsistent outpatient therapy, [facility], so the counselor they are only once with plan to continue there after discharge  Past Medical History:  Past Medical History:  Diagnosis Date   Constipation    Hypopituitarism (HCC)    Left foot drop    Pre-diabetes    Thyroid disease     Past Surgical History:  Procedure Laterality Date   CHOLECYSTECTOMY N/A 01/14/2022   Procedure: LAPAROSCOPIC CHOLECYSTECTOMY;  Surgeon: Stechschulte, Hyman Hopes, MD;  Location: [facility] OR;  Service: General;  Laterality: N/A;   Family History:  Family History  Problem Relation Age of Onset   Anxiety disorder Mother    Depression Mother    Hypertension Mother    Depression Father    Healthy Father    Drug abuse Brother    Hypertension Paternal  Grandmother    Diabetes Paternal Grandmother    Family Psychiatric  History: Psychiatric illness: Father and mother both have depression Suicide: Per patient's report father attempted suicide once Substance Abuse: None reported Social History:  Social History   Substance and Sexual Activity  Alcohol Use Not  Currently     Social History   Substance and Sexual Activity  Drug Use Never    Social History   Socioeconomic History   Marital status: Single    Spouse name: Not on file   Number of children: 0   Years of education: college student now   [employer] education level: Not on file  Occupational History   Occupation: Consulting civil engineer    Comment: Works at [employer]  Tobacco Use   Smoking status: Never   Smokeless tobacco: Never  Vaping Use   Vaping status: Never Used  Substance and Sexual Activity   Alcohol use: Not Currently   Drug use: Never   Sexual activity: Never  Other Topics Concern   Not on file  Social History Narrative   Lives at home with parents.   Right-handed.   No daily caffeine use.   Loves to draw, and write stories.    Social Determinants of Health   Financial Resource Strain: Not on file  Food Insecurity: No Food Insecurity (06/17/2023)   Hunger Vital Sign    Worried About Running Out of Food in the Last Year: Never true    Ran Out of Food in the Last Year: Never true  Transportation Needs: No Transportation Needs (06/17/2023)   PRAPARE - Administrator, Civil Service (Medical): No    Lack of Transportation (Non-Medical): No  Physical Activity: Not on file  Stress: Not on file  Social Connections: Not on file    Hospital Course:  During the course of patient's hospitalization, the 15-minute checks were adequate to ensure patient's safety. Patient did not exhibit erratic or aggressive behavior and was compliant with scheduled medication. Patient was recommended for outpatient psychiatry.  At the time of discharge patient is not reporting any acute suicidal/homicidal ideations/AVH, delusional thoughts or paranoia. Patient did not appear to be responding to any internal stimuli. Patient feels more confident about self-care & in managing their mental health problems. Patient currently denies any new issues or concerns. Education and supportive counseling  provided throughout patient's hospital stay & upon discharge.   Today upon discharge evaluation, the patient gives a mood of "good". Patient denies any specific concerns. Patient slept well, appetite good, regular bowel movements. Patient denies any new physical complaints. Patient feels that the medications have been helpful & is in agreement to continue current treatment regimen as recommended. Patient was able to engage in safety planning including plan to return to [facility] or contact emergency services if patient feels unable to maintain their own safety or the safety of others. Patient had no further questions, comments, or concerns. Patient left [facility] with all personal belongings in no apparent distress. Transportation per safe transport to home was arranged for patient.  Physical Findings: AIMS: Facial and Oral Movements Muscles of Facial Expression: None Lips and Perioral Area: None Jaw: None Tongue: None,Extremity Movements Upper (arms, wrists, hands, fingers): None Lower (legs, knees, ankles, toes): None, Trunk Movements Neck, shoulders, hips: None, Global Judgements Severity of abnormal movements overall : None Incapacitation due to abnormal movements: None Patient's awareness of abnormal movements: No Awareness, Dental Status Current problems with teeth and/or dentures?: No Does patient usually wear dentures?: No  CIWA:    COWS:     Musculoskeletal: Strength & Muscle Tone: within normal limits Gait & Station: normal Patient leans: N/A   Psychiatric Specialty Exam:  Presentation  General Appearance:  Appropriate for Environment  Eye Contact: Fair  Speech: Normal Rate  Speech Volume: Normal  Handedness: Right   Mood and Affect  Mood: Anxious  Affect: Appropriate   Thought Process  Thought Processes: Linear  Descriptions of Associations:Intact  Orientation:Full (Time, Place and Person)  Thought Content:Logical  History of  Schizophrenia/Schizoaffective disorder:No  Duration of Psychotic Symptoms:No data recorded Hallucinations:Hallucinations: None  Ideas of Reference:None  Suicidal Thoughts:Suicidal Thoughts: No  Homicidal Thoughts:Homicidal Thoughts: No   Sensorium  Memory: Immediate Fair; Recent Fair  Judgment: Fair  Insight: Fair   Art therapist  Concentration: Fair  Attention Span: Good  Recall: Good  Fund of Knowledge: Good  Language: Good   Psychomotor Activity  Psychomotor Activity: Psychomotor Activity: Normal   Assets  Assets: Communication Skills; Physical Health; Social Support   Sleep  Sleep: Sleep: Fair    Physical Exam: Physical Exam Vitals and nursing note reviewed.  Constitutional:      Appearance: Normal appearance.  HENT:     Head: Normocephalic and atraumatic.  Eyes:     Extraocular Movements: Extraocular movements intact.  Pulmonary:     Effort: Pulmonary effort is normal.  Musculoskeletal:        General: Normal range of motion.     Cervical back: Normal range of motion.  Neurological:     General: No focal deficit present.     Mental Status: She is alert and oriented to person, place, and time.  Psychiatric:        Behavior: Behavior normal.    Review of Systems  Constitutional:  Negative for fever.  HENT:  Negative for hearing loss.   Respiratory:  Negative for cough and shortness of breath.   Cardiovascular:  Negative for chest pain.  Gastrointestinal:  Negative for constipation, diarrhea, nausea and vomiting.  Genitourinary:  Negative for dysuria.  Musculoskeletal:  Negative for myalgias.  Skin:  Negative for rash.  Neurological:  Negative for dizziness, tremors and headaches.  Psychiatric/Behavioral:  Negative for hallucinations and suicidal ideas.    Blood pressure 101/61, pulse (!) 101, temperature 98.5 F (36.9 C), temperature source Oral, resp. rate 17, height 5\' 9"  (1.753 m), weight 101.9 kg, SpO2 100%. Body  mass index is 33.17 kg/m.   Social History   Tobacco Use  Smoking Status Never  Smokeless Tobacco Never   Tobacco Cessation:  N/A, patient does not currently use tobacco products   Blood Alcohol level:  No results found for: "ETH"  Metabolic Disorder Labs:  Lab Results  Component Value Date   HGBA1C 5.6 06/17/2023   MPG 114.02 06/17/2023   MPG 117 01/15/2023   No results found for: "PROLACTIN" Lab Results  Component Value Date   CHOL 114 06/19/2023   TRIG 43 06/19/2023   HDL 58 06/19/2023   CHOLHDL 2.0 06/19/2023   VLDL 9 06/19/2023   LDLCALC 47 06/19/2023   LDLCALC 48 06/17/2023    See Psychiatric Specialty Exam and Suicide Risk Assessment completed by Attending Physician prior to discharge.  Discharge destination:  Home  Is patient on multiple antipsychotic therapies at discharge:  No     Discharge Instructions     Diet - low sodium heart healthy   Complete by: As directed    Discharge instructions   Complete by: As directed  Prescriptions for new medications provided for the patient to bridge to follow up appointment. The patient was informed that refills for these prescriptions are generally not provided, and patient is encouraged to attend all follow up appointments to address medication refills and adjustments.   Today's discharge was reviewed with treatment team, and the team is in agreement that the patient is ready for discharge. The patient is was of the discharge plan for today and has been given opportunity to ask questions. At time of discharge, the patient does not vocalize any acute harm to self or others, is goal directed, able to advocate for self and organizational baseline.   At discharge, the patient is instructed to:  Take all medications as prescribed. Report any adverse effects and or reactions from the medicines to her outpatient provider promptly.  Do not engage in alcohol and/or illegal drug use while on prescription medicines.  In  the event of worsening symptoms, patient is instructed to call the crisis hotline, 911 and or go to the nearest ED for appropriate evaluation and treatment of symptoms.  Follow-up with primary care provider for further care of medical issues, concerns and or health care needs. * Pregnancy: Mood stabilizing agents and other medications used in psychiatry may pose risk to pregnancy. Women of childbearing age are advised to use birth control. If you are planning on becoming pregnant, please discuss with both your OBGYN and your psychiatrist prior to stopping medication and prior to pregnancy.   Increase activity slowly   Complete by: As directed       Allergies as of 06/23/2023   No Known Allergies      Medication List     TAKE these medications      Indication  ARIPiprazole 10 MG tablet Commonly known as: ABILIFY Take 1 tablet (10 mg total) by mouth daily.  Indication: Major Depressive Disorder   Auvelity 45-105 MG Tbcr Generic drug: Dextromethorphan-buPROPion ER Take 1 tablet by mouth 2 (two) times daily. What changed: See the new instructions.  Indication: Major Depressive Disorder   hydrOXYzine 25 MG tablet Commonly known as: ATARAX Take 1 tablet (25 mg total) by mouth 3 (three) times daily as needed for anxiety.  Indication: Feeling Anxious   levothyroxine 112 MCG tablet Commonly known as: SYNTHROID Take 112 mcg by mouth daily.  Indication: Underactive Thyroid   traZODone 100 MG tablet Commonly known as: DESYREL Take 1 tablet (100 mg total) by mouth at bedtime as needed for sleep.  Indication: Trouble Sleeping        Follow-up Information     [facility]. Go on 07/06/2023.   Specialty: Behavioral Health Why: You have an appointment for medication management services on 07/06/23 at 11:00 am, in person. Contact information: [address], [address] [place] [address] [phone]        [facility]. Call on 06/24/2023.   Why: Per provider request, please call to personally schedule an appointment for therapy services with your provider. Contact information: [address], [place], [place] [address]  Phone: [phone]                Follow-up recommendations:  Activity:  ad lib Diet:  low sodium, low fat  Comments:   Prescriptions for new medications provided for the patient to bridge to follow up appointment. The patient was informed that refills for these prescriptions are generally not provided, and patient is encouraged to attend all follow up appointments  to address medication refills and adjustments.   Today's discharge was reviewed with treatment team, and the team is in agreement that the patient is ready for discharge. The patient is was of the discharge plan for today and has been given opportunity to ask questions. At time of discharge, the patient does not vocalize any acute harm to self or others, is goal directed, able to advocate for self and organizational baseline.   At discharge, the patient is instructed to:  Take all medications as prescribed. Report any adverse effects and or reactions from the medicines to her outpatient provider promptly.  Do not engage in alcohol and/or illegal drug use while on prescription medicines.  In the event of worsening symptoms, patient is instructed to call the crisis hotline, 911 and or go to the nearest ED for appropriate evaluation and treatment of symptoms.  Follow-up with primary care provider for further care of medical issues, concerns and or health care needs. * Pregnancy: Mood stabilizing agents and other medications used in psychiatry may pose risk to pregnancy. Women of childbearing age are advised to use birth control. If you are planning on becoming pregnant, please discuss with both your OBGYN and your psychiatrist prior to stopping medication and prior to pregnancy.  Signed: Roselle Locus, MD 06/23/2023, 9:48 AM

## 2023-06-23 NOTE — Telephone Encounter (Signed)
30 day supply submit by another provider today

## 2023-06-23 NOTE — Group Note (Signed)
Date:  06/23/2023 Time:  8:49 AM  Group Topic/Focus:  Goals Group:   The focus of this group is to help patients establish daily goals to achieve during treatment and discuss how the patient can incorporate goal setting into their daily lives to aide in recovery.    Participation Level:  Active  Participation Quality:  Appropriate  Affect:  Appropriate  Cognitive:  Appropriate  Insight: Appropriate  Engagement in Group:  Engaged  Modes of Intervention:  Discussion  Additional Comments:    Karna Abed D Ahlia Lemanski 06/23/2023, 8:49 AM

## 2023-06-23 NOTE — BHH Suicide Risk Assessment (Signed)
[facility] Discharge Suicide Risk Assessment   Principal Problem: MDD (major depressive disorder), recurrent severe, without psychosis (HCC) Discharge Diagnoses: Principal Problem:   MDD (major depressive disorder), recurrent severe, without psychosis (HCC) Active Problems:   Anxiety disorder, unspecified   Total Time spent with patient: 20 minutes  Musculoskeletal: Strength & Muscle Tone: within normal limits Gait & Station: normal Patient leans: N/A  Psychiatric Specialty Exam  Presentation  General Appearance:  Appropriate for Environment  Eye Contact: Fair  Speech: Normal Rate  Speech Volume: Normal  Handedness: Right   Mood and Affect  Mood: Anxious  Duration of Depression Symptoms: Greater than two weeks  Affect: Appropriate   Thought Process  Thought Processes: Linear  Descriptions of Associations:Intact  Orientation:Full (Time, Place and Person)  Thought Content:Logical  History of Schizophrenia/Schizoaffective disorder:No  Duration of Psychotic Symptoms:No data recorded Hallucinations:Hallucinations: None  Ideas of Reference:None  Suicidal Thoughts:Suicidal Thoughts: No  Homicidal Thoughts:Homicidal Thoughts: No   Sensorium  Memory: Immediate Fair; Recent Fair  Judgment: Fair  Insight: Fair   Art therapist  Concentration: Fair  Attention Span: Good  Recall: Good  Fund of Knowledge: Good  Language: Good   Psychomotor Activity  Psychomotor Activity: Psychomotor Activity: Normal   Assets  Assets: Communication Skills; Physical Health; Social Support   Sleep  Sleep: Sleep: Fair   Physical Exam: Physical Exam Vitals and nursing note reviewed.  Constitutional:      Appearance: Normal appearance.  HENT:     Head: Normocephalic and atraumatic.  Eyes:     Extraocular Movements: Extraocular movements intact.  Pulmonary:     Effort: Pulmonary effort is normal.  Musculoskeletal:        General:  Normal range of motion.     Cervical back: Normal range of motion.  Neurological:     General: No focal deficit present.     Mental Status: She is alert and oriented to person, place, and time.  Psychiatric:        Behavior: Behavior normal.    Review of Systems  Constitutional:  Negative for fever.  HENT:  Negative for hearing loss.   Respiratory:  Negative for cough and shortness of breath.   Cardiovascular:  Negative for chest pain.  Gastrointestinal:  Negative for constipation, diarrhea, nausea and vomiting.  Genitourinary:  Negative for dysuria.  Musculoskeletal:  Negative for myalgias.  Skin:  Negative for rash.  Neurological:  Negative for dizziness, tremors and headaches.  Psychiatric/Behavioral:  Negative for hallucinations and suicidal ideas.    Blood pressure 101/61, pulse (!) 101, temperature 98.5 F (36.9 C), temperature source Oral, resp. rate 17, height 5\' 9"  (1.753 m), weight 101.9 kg, SpO2 100%. Body mass index is 33.17 kg/m.  Mental Status Per Nursing Assessment::   On Admission:  Suicidal ideation indicated by patient  Demographic Factors:  Adolescent or young adult  Loss Factors: Financial problems/change in socioeconomic status  Historical Factors: NA  Risk Reduction Factors:   Living with another person, especially a relative and Positive social support  Continued Clinical Symptoms:  Depression:   Anhedonia  Cognitive Features That Contribute To Risk:  None    Suicide Risk:  Mild:  Suicidal ideation of limited frequency, intensity, duration, and specificity.  There are no identifiable plans, no associated intent, mild dysphoria and related symptoms, good self-control (both objective and subjective assessment), few other risk factors, and identifiable protective factors, including available and accessible social support.   Follow-up Information     [facility].  Go on 07/06/2023.   Specialty: Behavioral Health Why:  You have an appointment for medication management services on 07/06/23 at 11:00 am, in person. Contact information: [address] [place] [address] [phone]        [facility]. Call on 06/24/2023.   Why: Per provider request, please call to personally schedule an appointment for therapy services with your provider. Contact information: [address], [place], [place] [address]  Phone: [phone]                Plan Of Care/Follow-up recommendations:  Activity:  ad lib Diet:  low sodium, low fat  Roselle Locus, MD 06/23/2023, 9:44 AM

## 2023-06-23 NOTE — Progress Notes (Signed)
Pt discharged today. Pt removed all belongings. Pt verbalized understanding of medications and discharge instructions. Pt denies SI/HI/AVH.

## 2023-06-24 NOTE — Progress Notes (Signed)
Report received from [facility]

## 2023-07-06 ENCOUNTER — Encounter: Payer: Self-pay | Admitting: Behavioral Health

## 2023-07-06 ENCOUNTER — Ambulatory Visit: Payer: 59 | Admitting: Behavioral Health

## 2023-07-06 DIAGNOSIS — F411 Generalized anxiety disorder: Secondary | ICD-10-CM | POA: Diagnosis not present

## 2023-07-06 DIAGNOSIS — F3130 Bipolar disorder, current episode depressed, mild or moderate severity, unspecified: Secondary | ICD-10-CM

## 2023-07-06 DIAGNOSIS — F331 Major depressive disorder, recurrent, moderate: Secondary | ICD-10-CM

## 2023-07-06 MED ORDER — ARIPIPRAZOLE 10 MG PO TABS: 10.0000 mg | ORAL_TABLET | Freq: Every day | ORAL | 3 refills | Status: DC

## 2023-07-06 MED ORDER — AUVELITY 45-105 MG PO TBCR: 1.0000 | EXTENDED_RELEASE_TABLET | Freq: Two times a day (BID) | ORAL | 0 refills | Status: DC

## 2023-07-06 NOTE — Progress Notes (Addendum)
[facility]  Patient ID: SHAMILLE BARNGROVER,  MRN: [id number]  PCP: Merri Brunette, MD  Date of Evaluation: 07/06/2023 Time spent:30 minutes  Chief Complaint:  Chief Complaint   Anxiety; Depression; Follow-up; Patient Education; Manic Behavior; Medication Refill; Family Problem     HISTORY/CURRENT STATUS: HPI   "Eugenia", 22 year old female presents to this office for follow up. She is back to being very subdued, flat, and speaking with low volume again. Chart review reveals she was admitted to [facility] on 10/3 and discharged on 10/9.  Mikiyah admitted to stopping or being inconsistent with her medication. She usually stops when she feels better. Says her moods have been a little better today. She has resumed taking her Abilify and Auvelity.  She acknowledges feeling stable on her medication. Today she feels safe. Verbally contracts for safety with this Clinical research associate. Says that her parent are now supervising her medication administration.    Report anxiety at 3/10 and depression at 6/10.   She says that she is not currently suicidal and contracts for safety. Denies Mania, No psychosis, No  current SI/HI.   No previous psychiatric medication failures.     Individual Medical History/ Review of Systems: Changes? :No   Allergies: Patient has no known allergies.  Current Medications:  Current Outpatient Medications:    ARIPiprazole (ABILIFY) 10 MG tablet, Take 1 tablet (10 mg total) by mouth daily., Disp: 30 tablet, Rfl: 0   Dextromethorphan-buPROPion ER (AUVELITY) 45-105 MG TBCR, Take 1 tablet by mouth 2 (two) times daily., Disp: 30 tablet, Rfl: 0   hydrOXYzine (ATARAX) 25 MG tablet, Take 1 tablet (25 mg total) by mouth 3 (three) times daily as needed for anxiety., Disp: 30 tablet, Rfl: 0   levothyroxine (SYNTHROID) 112 MCG tablet, Take 112 mcg by mouth daily., Disp: , Rfl:    traZODone (DESYREL) 100 MG tablet, Take 1 tablet (100 mg total) by mouth at  bedtime as needed for sleep., Disp: 30 tablet, Rfl: 0 Medication Side Effects: none  Family Medical/ Social History: Changes? No  MENTAL HEALTH EXAM:  There were no vitals taken for this visit.There is no height or weight on file to calculate BMI.  General Appearance: Casual, Neat, and Well Groomed  Eye Contact:  Good  Speech:  Clear and Coherent  Volume:  Normal  Mood:  NA  Affect:  Appropriate  Thought Process:  Coherent  Orientation:  Full (Time, Place, and Person)  Thought Content: Logical   Suicidal Thoughts:  No  Homicidal Thoughts:  No  Memory:  WNL  Judgement:  Good  Insight:  Good  Psychomotor Activity:  Normal  Concentration:  Concentration: Good  Recall:  Good  Fund of Knowledge: Good  Language: Good  Assets:  Desire for Improvement  ADL's:  Intact  Cognition: WNL  Prognosis:  Good    DIAGNOSES:    ICD-10-CM   1. Bipolar I disorder, most recent episode depressed (HCC)  F31.30     2. Generalized anxiety disorder  F41.1     3. Moderate episode of recurrent major depressive disorder (HCC)  F33.1       Receiving Psychotherapy: No    RECOMMENDATIONS:   Greater than 50% of 30 min face to face time with patient was spent on counseling and coordination of care. Reinforced again the importance of taking medication exactly as prescribed.  I explained to Pt that this was the third time addressing  non compliance with medication leading to exacerbation of symptoms  and hospitalization. I explained that she had to be compliant with treatment or that I did not feel comfortable in continuing to provide care, and that she risk being discharged from the practice due to safety concerns.  She agreed and understood.  I encouraged her to continue to follow up with her PCP regularly due to hx of thyroid problems.   Continue Auvelity  90-210 daily Continue Abilify to 10  mg by mouth daily until next visit.  To follow up in 6 weeks to reassess.  Will report any worsening  symptoms or side effects promptly Provided emergency contact information to include 911, [facility], [place] [facility], after hours number, Suicide Hotline. Discussed potential metabolic side effects associated with atypical antipsychotics, as well as potential risk for movement side effects. Advised pt to contact office if movement side effects occur.   TSH and recent labs from 10/5 reviewed Reviewed PDMP  Arlys John A. Micalah Cabezas, NP

## 2023-07-18 ENCOUNTER — Telehealth: Payer: Self-pay

## 2023-07-18 NOTE — Telephone Encounter (Signed)
Received a request for a prior authorization on Auvelity 45-105 mg with [facility], but this was previously approved from 05/10/2023-11/10/2023 PA# [id number]

## 2023-08-10 ENCOUNTER — Ambulatory Visit: Payer: 59 | Admitting: Behavioral Health

## 2023-08-17 ENCOUNTER — Ambulatory Visit: Payer: 59 | Admitting: Behavioral Health

## 2023-08-23 ENCOUNTER — Other Ambulatory Visit: Payer: Self-pay | Admitting: Behavioral Health

## 2023-09-13 ENCOUNTER — Encounter: Payer: Self-pay | Admitting: Behavioral Health

## 2023-09-13 ENCOUNTER — Ambulatory Visit (INDEPENDENT_AMBULATORY_CARE_PROVIDER_SITE_OTHER): Payer: 59 | Admitting: Behavioral Health

## 2023-09-13 DIAGNOSIS — F331 Major depressive disorder, recurrent, moderate: Secondary | ICD-10-CM | POA: Diagnosis not present

## 2023-09-13 DIAGNOSIS — F411 Generalized anxiety disorder: Secondary | ICD-10-CM

## 2023-09-13 MED ORDER — AUVELITY 45-105 MG PO TBCR
45.0000 | EXTENDED_RELEASE_TABLET | Freq: Two times a day (BID) | ORAL | 1 refills | Status: DC
Start: 2023-09-13 — End: 2023-09-13

## 2023-09-13 MED ORDER — ARIPIPRAZOLE 10 MG PO TABS: 10.0000 mg | ORAL_TABLET | Freq: Every day | ORAL | 3 refills | Status: DC

## 2023-09-13 MED ORDER — AUVELITY 45-105 MG PO TBCR
EXTENDED_RELEASE_TABLET | ORAL | 3 refills | Status: DC
Start: 2023-09-13 — End: 2024-03-21

## 2023-09-13 NOTE — Progress Notes (Signed)
[facility]  Patient ID: Joyce [name],  MRN: [id number]  PCP: Merri Brunette, MD  Date of Evaluation: 09/13/2023 Time spent:30 minutes  Chief Complaint:  Chief Complaint   Anxiety; Depression; Follow-up; Patient Education; Medication Refill     HISTORY/CURRENT STATUS: HPI  "Joyce [name]", 22 year old female presents to this office for follow up. Wearing mask. Says she has had respiratory illness for one week but getting better.  Improved eye contact and more talkative today. Says she has been taking medication as prescribed.  She has resumed taking her Abilify and Auvelity.  She acknowledges feeling stable on her medication. Today she feels safe. Verbally contracts for safety with this Clinical research associate. Says that her parent are now supervising her medication administration.    Report anxiety at 3/10 and depression at 3/10.   She says that she is not currently suicidal and contracts for safety. Denies Mania, No psychosis, No  current SI/HI.   No previous psychiatric medication failures.      Individual Medical History/ Review of Systems: Changes? :No   Allergies: Patient has no known allergies.  Current Medications:  Current Outpatient Medications:    Dextromethorphan-buPROPion ER (AUVELITY) 45-105 MG TBCR, Take one tablet by mouth twice daily 8 hours between doses., Disp: 60 tablet, Rfl: 3   ARIPiprazole (ABILIFY) 10 MG tablet, Take 1 tablet (10 mg total) by mouth daily., Disp: 30 tablet, Rfl: 3   hydrOXYzine (ATARAX) 25 MG tablet, Take 1 tablet (25 mg total) by mouth 3 (three) times daily as needed for anxiety., Disp: 30 tablet, Rfl: 0   levothyroxine (SYNTHROID) 112 MCG tablet, Take 112 mcg by mouth daily., Disp: , Rfl:    traZODone (DESYREL) 100 MG tablet, Take 1 tablet (100 mg total) by mouth at bedtime as needed for sleep., Disp: 30 tablet, Rfl: 0 Medication Side Effects: none  Family Medical/ Social History: Changes? No  MENTAL HEALTH EXAM:  There were no vitals  taken for this visit.There is no height or weight on file to calculate BMI.  General Appearance: Casual and Neat  Eye Contact:  Good  Speech:  Clear and Coherent  Volume:  Normal  Mood:  NA  Affect:  Appropriate  Thought Process:  Coherent  Orientation:  Full (Time, Place, and Person)  Thought Content: Logical   Suicidal Thoughts:  No  Homicidal Thoughts:  No  Memory:  WNL  Judgement:  Good  Insight:  Good  Psychomotor Activity:  Normal  Concentration:  Concentration: Good  Recall:  Good  Fund of Knowledge: Good  Language: Good  Assets:  Desire for Improvement  ADL's:  Intact  Cognition: WNL  Prognosis:  Good    DIAGNOSES:    ICD-10-CM   1. Generalized anxiety disorder  F41.1 ARIPiprazole (ABILIFY) 10 MG tablet    Dextromethorphan-buPROPion ER (AUVELITY) 45-105 MG TBCR    DISCONTINUED: Dextromethorphan-buPROPion ER (AUVELITY) 45-105 MG TBCR    2. Moderate episode of recurrent major depressive disorder (HCC)  F33.1 ARIPiprazole (ABILIFY) 10 MG tablet    Dextromethorphan-buPROPion ER (AUVELITY) 45-105 MG TBCR    DISCONTINUED: Dextromethorphan-buPROPion ER (AUVELITY) 45-105 MG TBCR      Receiving Psychotherapy: No    RECOMMENDATIONS:    Greater than 50% of 30 min face to face time with patient was spent on counseling and coordination of care. Reinforced again the importance of taking medication exactly as prescribed. She assures me that she is taking as prescribed.   I encouraged her to continue to follow up with her  PCP regularly due to hx of thyroid problems.    Continue Auvelity  90-210 daily Continue Abilify to 10  mg by mouth daily until next visit.  To follow up in 12 weeks to reassess.  Will report any worsening symptoms or side effects promptly Provided emergency contact information to include 911, [facility], [place] [facility], after hours number, Suicide Hotline. Discussed potential metabolic side effects associated with atypical antipsychotics, as well as potential  risk for movement side effects. Advised pt to contact office if movement side effects occur.   Reviewed PDMP   Watt Climes. Cliffton Asters, NP    Joan Flores, NP

## 2023-09-19 ENCOUNTER — Other Ambulatory Visit: Payer: Self-pay | Admitting: Behavioral Health

## 2023-09-19 DIAGNOSIS — F411 Generalized anxiety disorder: Secondary | ICD-10-CM

## 2023-09-19 DIAGNOSIS — F331 Major depressive disorder, recurrent, moderate: Secondary | ICD-10-CM

## 2023-12-06 ENCOUNTER — Telehealth (INDEPENDENT_AMBULATORY_CARE_PROVIDER_SITE_OTHER): Payer: 59 | Admitting: Behavioral Health

## 2023-12-06 ENCOUNTER — Encounter: Payer: Self-pay | Admitting: Behavioral Health

## 2023-12-06 DIAGNOSIS — F3130 Bipolar disorder, current episode depressed, mild or moderate severity, unspecified: Secondary | ICD-10-CM

## 2023-12-06 DIAGNOSIS — F411 Generalized anxiety disorder: Secondary | ICD-10-CM

## 2023-12-06 NOTE — Progress Notes (Signed)
 Joyce [name] [id number] March 05, 2001 22 y.o.  Virtual Visit via Video Note  I connected with pt @ on 12/06/23 at  8:00 AM EDT by a video enabled telemedicine application and verified that I am speaking with the correct person using two identifiers.   I discussed the limitations of evaluation and management by telemedicine and the availability of in person appointments. The patient expressed understanding and agreed to proceed.  I discussed the assessment and treatment plan with the patient. The patient was provided an opportunity to ask questions and all were answered. The patient agreed with the plan and demonstrated an understanding of the instructions.   The patient was advised to call back or seek an in-person evaluation if the symptoms worsen or if the condition fails to improve as anticipated.  I provided 20 minutes of non-face-to-face time during this encounter.  The patient was located at home.  The provider was located at [facility].   Joyce Flores, NP   Subjective:   Patient ID:  Joyce [name] is a 22 y.o. (DOB 04-Aug-2001) female.  Chief Complaint:  Chief Complaint  Patient presents with   Depression   Anxiety   Follow-up   Medication Refill   Patient Education    HPI  "Joyce [name]", 23 year old female presents to this office via video visit for follow up. Says she is doing much better. Medication working well and says that she is taking as prescribed. Still working at daycare full time.   Improved eye contact and more talkative today. She acknowledges feeling stable on her medication. Today she feels safe. Verbally contracts for safety with this Clinical research associate. Says that her parent are now supervising her medication administration.    Report anxiety at 3/10 and depression at 3/10.   She says that she is not currently suicidal and contracts for safety. Denies Mania, No psychosis, No  current SI/HI.   No previous psychiatric medication failures.          Review  of Systems:  Review of Systems  Constitutional: Negative.   Allergic/Immunologic: Negative.   Neurological: Negative.   Psychiatric/Behavioral:  Positive for dysphoric mood.     Medications: I have reviewed the patient's current medications.  Current Outpatient Medications  Medication Sig Dispense Refill   ARIPiprazole (ABILIFY) 10 MG tablet Take 1 tablet (10 mg total) by mouth daily. 30 tablet 3   Dextromethorphan-buPROPion ER (AUVELITY) 45-105 MG TBCR Take one tablet by mouth twice daily 8 hours between doses. 60 tablet 3   hydrOXYzine (ATARAX) 25 MG tablet Take 1 tablet (25 mg total) by mouth 3 (three) times daily as needed for anxiety. 30 tablet 0   traZODone (DESYREL) 100 MG tablet Take 1 tablet (100 mg total) by mouth at bedtime as needed for sleep. 30 tablet 0   levothyroxine (SYNTHROID) 112 MCG tablet Take 112 mcg by mouth daily.     No current facility-administered medications for this visit.    Medication Side Effects: None  Allergies: No Known Allergies  Past Medical History:  Diagnosis Date   Constipation    Hypopituitarism (HCC)    Left foot drop    Pre-diabetes    Thyroid disease     Family History  Problem Relation Age of Onset   Anxiety disorder Mother    Depression Mother    Hypertension Mother    Depression Father    Healthy Father    Drug abuse Brother    Hypertension Paternal Grandmother    Diabetes  Paternal Grandmother     Social History   Socioeconomic History   Marital status: Single    Spouse name: Not on file   Number of children: 0   Years of education: college student now   [employer] education level: Not on file  Occupational History   Occupation: Consulting civil engineer    Comment: Works at [employer]  Tobacco Use   Smoking status: Never   Smokeless tobacco: Never  Vaping Use   Vaping status: Never Used  Substance and Sexual Activity   Alcohol use: Not Currently   Drug use: Never   Sexual activity: Never  Other Topics Concern   Not on  file  Social History Narrative   Lives at home with parents.   Right-handed.   No daily caffeine use.   Loves to draw, and write stories.    Social Drivers of Corporate investment banker Strain: Not on file  Food Insecurity: No Food Insecurity (06/17/2023)   Hunger Vital Sign    Worried About Running Out of Food in the Last Year: Never true    Ran Out of Food in the Last Year: Never true  Transportation Needs: No Transportation Needs (06/17/2023)   PRAPARE - Administrator, Civil Service (Medical): No    Lack of Transportation (Non-Medical): No  Physical Activity: Not on file  Stress: Not on file  Social Connections: Not on file  Intimate Partner Violence: Not At Risk (06/17/2023)   Humiliation, Afraid, Rape, and Kick questionnaire    Fear of Current or Ex-Partner: No    Emotionally Abused: No    Physically Abused: No    Sexually Abused: No    Past Medical History, Surgical history, Social history, and Family history were reviewed and updated as appropriate.   Please see review of systems for further details on the patient's review from today.   Objective:   Physical Exam:  There were no vitals taken for this visit.  Physical Exam Neurological:     Mental Status: She is alert and oriented to person, place, and time.  Psychiatric:        Attention and Perception: Attention and perception normal.        Mood and Affect: Mood normal.        Speech: Speech normal.        Behavior: Behavior normal. Behavior is cooperative.        Cognition and Memory: Cognition and memory normal.        Judgment: Judgment normal.     Comments: Insight intact     Lab Review:     Component Value Date/Time   NA 138 06/17/2023 1234   K 3.8 06/17/2023 1234   CL 103 06/17/2023 1234   CO2 24 06/17/2023 1234   GLUCOSE 77 06/17/2023 1234   BUN 11 06/17/2023 1234   CREATININE 0.81 06/17/2023 1234   CREATININE 0.84 01/15/2023 0813   CALCIUM 9.2 06/17/2023 1234   PROT 7.9  06/17/2023 1234   ALBUMIN 3.7 06/17/2023 1234   AST 23 06/17/2023 1234   ALT 17 06/17/2023 1234   ALKPHOS 92 06/17/2023 1234   BILITOT 0.7 06/17/2023 1234   GFRNONAA >60 06/17/2023 1234   GFRAA >60 03/16/2017 0036       Component Value Date/Time   WBC 11.2 (H) 06/19/2023 0657   RBC 4.52 06/19/2023 0657   HGB 12.3 06/19/2023 0657   HCT 40.5 06/19/2023 0657   PLT 284 06/19/2023 0657   MCV 89.6 06/19/2023  0657   MCH 27.2 06/19/2023 0657   MCHC 30.4 06/19/2023 0657   RDW 16.0 (H) 06/19/2023 0657   LYMPHSABS 3.6 06/19/2023 0657   MONOABS 0.7 06/19/2023 0657   EOSABS 0.2 06/19/2023 0657   BASOSABS 0.1 06/19/2023 0657    No results found for: "POCLITH", "LITHIUM"   No results found for: "PHENYTOIN", "PHENOBARB", "VALPROATE", "CBMZ"   .res Assessment: Plan:    Greater than 50% of 30 min video visit time with patient was spent on counseling and coordination of care. Reinforced again the importance of taking medication exactly as prescribed. We discussed her improved stability this visit. Requesting no medication adjustments today.  I encouraged her to continue to follow up with her PCP regularly due to hx of thyroid problems.    Continue Auvelity  90-210 daily Continue Abilify to 10  mg by mouth daily until next visit.  To follow up in 12 weeks to reassess.  Will report any worsening symptoms or side effects promptly Provided emergency contact information to include 911, [facility], [place] [facility], after hours number, Suicide Hotline. Discussed potential metabolic side effects associated with atypical antipsychotics, as well as potential risk for movement side effects. Advised pt to contact office if movement side effects occur.   Reviewed PDMP   Joyce [name]. Joyce Traynham, NP    Joyce [name] was seen today for depression, anxiety, follow-up, medication refill and patient education.  Diagnoses and all orders for this visit:  Generalized anxiety disorder  Bipolar I disorder, most recent episode  depressed (HCC)     Please see After Visit Summary for patient specific instructions.  No future appointments.  No orders of the defined types were placed in this encounter.     -------------------------------

## 2024-03-20 ENCOUNTER — Telehealth: Payer: Self-pay | Admitting: Behavioral Health

## 2024-03-20 DIAGNOSIS — F411 Generalized anxiety disorder: Secondary | ICD-10-CM

## 2024-03-20 DIAGNOSIS — F331 Major depressive disorder, recurrent, moderate: Secondary | ICD-10-CM

## 2024-03-20 NOTE — Telephone Encounter (Signed)
 Next visit is 04/10/24. Requesting a refill on Auvility called to:   [facility] - [place], [place] - [address], [address] [phone]    Phone number is [phone].  [phone]

## 2024-03-21 MED ORDER — AUVELITY 45-105 MG PO TBCR: EXTENDED_RELEASE_TABLET | ORAL | 0 refills | Status: DC

## 2024-03-21 NOTE — Telephone Encounter (Signed)
 Sent!

## 2024-03-22 ENCOUNTER — Ambulatory Visit (INDEPENDENT_AMBULATORY_CARE_PROVIDER_SITE_OTHER)
Admission: EM | Admit: 2024-03-22 | Discharge: 2024-03-23 | Disposition: A | Discharge status: Other Acute Inpatient Hospital | Source: Home / Self Care

## 2024-03-22 DIAGNOSIS — F332 Major depressive disorder, recurrent severe without psychotic features: Secondary | ICD-10-CM | POA: Insufficient documentation

## 2024-03-22 DIAGNOSIS — R45851 Suicidal ideations: Secondary | ICD-10-CM | POA: Insufficient documentation

## 2024-03-22 LAB — MAGNESIUM: Magnesium: 1.9 mg/dL (ref 1.7–2.4)

## 2024-03-22 LAB — CBC WITH DIFFERENTIAL/PLATELET
Abs Immature Granulocytes: 0.02 K/uL (ref 0.00–0.07)
Basophils Absolute: 0.1 K/uL (ref 0.0–0.1)
Basophils Relative: 1 %
Eosinophils Absolute: 0.1 K/uL (ref 0.0–0.5)
Eosinophils Relative: 1 %
HCT: 43.6 % (ref 36.0–46.0)
Hemoglobin: 13.8 g/dL (ref 12.0–15.0)
Immature Granulocytes: 0 %
Lymphocytes Relative: 36 %
Lymphs Abs: 4.2 K/uL — ABNORMAL HIGH (ref 0.7–4.0)
MCH: 27.8 pg (ref 26.0–34.0)
MCHC: 31.7 g/dL (ref 30.0–36.0)
MCV: 87.7 fL (ref 80.0–100.0)
Monocytes Absolute: 0.7 K/uL (ref 0.1–1.0)
Monocytes Relative: 6 %
Neutro Abs: 6.5 K/uL (ref 1.7–7.7)
Neutrophils Relative %: 56 %
Platelets: 296 K/uL (ref 150–400)
RBC: 4.97 MIL/uL (ref 3.87–5.11)
RDW: 15.3 % (ref 11.5–15.5)
WBC: 11.5 K/uL — ABNORMAL HIGH (ref 4.0–10.5)
nRBC: 0 % (ref 0.0–0.2)

## 2024-03-22 LAB — COMPREHENSIVE METABOLIC PANEL WITH GFR
ALT: 13 U/L (ref 0–44)
AST: 21 U/L (ref 15–41)
Albumin: 3.5 g/dL (ref 3.5–5.0)
Alkaline Phosphatase: 78 U/L (ref 38–126)
Anion gap: 10 (ref 5–15)
BUN: 7 mg/dL (ref 6–20)
CO2: 27 mmol/L (ref 22–32)
Calcium: 9.3 mg/dL (ref 8.9–10.3)
Chloride: 102 mmol/L (ref 98–111)
Creatinine, Ser: 0.74 mg/dL (ref 0.44–1.00)
GFR, Estimated: >60 mL/min (ref >60)
Glucose, Bld: 71 mg/dL (ref 70–99)
Potassium: 4.4 mmol/L (ref 3.5–5.1)
Sodium: 139 mmol/L (ref 135–145)
Total Bilirubin: 0.4 mg/dL (ref 0.0–1.2)
Total Protein: 7.2 g/dL (ref 6.5–8.1)

## 2024-03-22 LAB — POCT URINE DRUG SCREEN - MANUAL ENTRY (I-SCREEN)
POC Amphetamine UR: NOT DETECTED
POC Buprenorphine (BUP): NOT DETECTED
POC Cocaine UR: NOT DETECTED
POC Marijuana UR: NOT DETECTED
POC Methadone UR: NOT DETECTED
POC Methamphetamine UR: NOT DETECTED
POC Morphine: NOT DETECTED
POC Oxazepam (BZO): NOT DETECTED
POC Oxycodone UR: NOT DETECTED
POC Secobarbital (BAR): NOT DETECTED

## 2024-03-22 LAB — LIPID PANEL
Cholesterol: 113 mg/dL (ref 0–200)
HDL: 53 mg/dL (ref >40)
LDL Cholesterol: 47 mg/dL (ref 0–99)
Total CHOL/HDL Ratio: 2.1 ratio
Triglycerides: 65 mg/dL (ref <150)
VLDL: 13 mg/dL (ref 0–40)

## 2024-03-22 LAB — URINALYSIS, ROUTINE W REFLEX MICROSCOPIC
Bilirubin Urine: NEGATIVE
Glucose, UA: NEGATIVE mg/dL
Hgb urine dipstick: NEGATIVE
Ketones, ur: NEGATIVE mg/dL
Leukocytes,Ua: NEGATIVE
Nitrite: NEGATIVE
Protein, ur: NEGATIVE mg/dL
Specific Gravity, Urine: 1.019 (ref 1.005–1.030)
pH: 5 (ref 5.0–8.0)

## 2024-03-22 LAB — TSH: TSH: 1.472 u[IU]/mL (ref 0.350–4.500)

## 2024-03-22 LAB — POC URINE PREG, ED: Preg Test, Ur: NEGATIVE

## 2024-03-22 LAB — ETHANOL: Alcohol, Ethyl (B): <15 mg/dL (ref <15)

## 2024-03-22 MED ORDER — HALOPERIDOL LACTATE 5 MG/ML IJ SOLN: 5.0000 mg | Freq: Three times a day (TID) | INTRAMUSCULAR | Status: DC | PRN

## 2024-03-22 MED ORDER — TRAZODONE HCL 100 MG PO TABS
100.0000 mg | ORAL_TABLET | Freq: Every evening | ORAL | Status: DC | PRN
Start: 2024-03-22 — End: 2024-03-23

## 2024-03-22 MED ORDER — ACETAMINOPHEN 325 MG PO TABS
650.0000 mg | ORAL_TABLET | Freq: Four times a day (QID) | ORAL | Status: DC | PRN
  Administered 2024-03-22: 650 mg via ORAL
  Filled 2024-03-22: qty 2

## 2024-03-22 MED ORDER — ALUM & MAG HYDROXIDE-SIMETH 200-200-20 MG/5ML PO SUSP: 30.0000 mL | ORAL | Status: DC | PRN

## 2024-03-22 MED ORDER — DIPHENHYDRAMINE HCL 50 MG/ML IJ SOLN: 50.0000 mg | Freq: Three times a day (TID) | INTRAMUSCULAR | Status: DC | PRN

## 2024-03-22 MED ORDER — HALOPERIDOL LACTATE 5 MG/ML IJ SOLN: 10.0000 mg | Freq: Three times a day (TID) | INTRAMUSCULAR | Status: DC | PRN

## 2024-03-22 MED ORDER — LORAZEPAM 2 MG/ML IJ SOLN: 2.0000 mg | Freq: Three times a day (TID) | INTRAMUSCULAR | Status: DC | PRN

## 2024-03-22 MED ORDER — DIPHENHYDRAMINE HCL 50 MG PO CAPS
50.0000 mg | ORAL_CAPSULE | Freq: Three times a day (TID) | ORAL | Status: DC | PRN
  Administered 2024-03-22: 50 mg via ORAL
  Filled 2024-03-22: qty 1

## 2024-03-22 MED ORDER — MAGNESIUM HYDROXIDE 400 MG/5ML PO SUSP: 30.0000 mL | Freq: Every day | ORAL | Status: DC | PRN

## 2024-03-22 MED ORDER — HALOPERIDOL 5 MG PO TABS
5.0000 mg | ORAL_TABLET | Freq: Three times a day (TID) | ORAL | Status: DC | PRN
  Administered 2024-03-22: 5 mg via ORAL
  Filled 2024-03-22: qty 1
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
## 2024-03-22 NOTE — ED Notes (Signed)
 Pt arrived to OBS adult bed 5 from assessment. Flat affect.   Pt reports passive SI no plan.  Pt also reports intrusive thoughts at times stating to harm self. No specific plan or intent.  Pt aggress to  contract for safety and come to staff before harming self/  Pt reports she feels the medications she is currently on are not working. Pt has air boot on left foot Hourly observations continue for safety

## 2024-03-22 NOTE — ED Notes (Signed)
 Patient is cooperative. Patient currently resting in recliner. RR even and unlabored, appearing in no noted distress. Environmental check complete. Patient reports to nurse that bit better and sleepy.
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
## 2024-03-23 ENCOUNTER — Encounter (HOSPITAL_COMMUNITY): Payer: Self-pay

## 2024-03-23 ENCOUNTER — Inpatient Hospital Stay (HOSPITAL_COMMUNITY)
Admission: AD | Admit: 2024-03-23 | Discharge: 2024-03-29 | DRG: 885 | Disposition: A | Discharge status: Home / Self Care | Source: Other Acute Inpatient Hospital | Attending: Psychiatry | Admitting: Psychiatry

## 2024-03-23 ENCOUNTER — Other Ambulatory Visit: Payer: Self-pay

## 2024-03-23 DIAGNOSIS — Z608 Other problems related to social environment: Secondary | ICD-10-CM | POA: Diagnosis present

## 2024-03-23 DIAGNOSIS — Z833 Family history of diabetes mellitus: Secondary | ICD-10-CM

## 2024-03-23 DIAGNOSIS — Z9151 Personal history of suicidal behavior: Secondary | ICD-10-CM

## 2024-03-23 DIAGNOSIS — G5792 Unspecified mononeuropathy of left lower limb: Secondary | ICD-10-CM | POA: Diagnosis present

## 2024-03-23 DIAGNOSIS — Z6833 Body mass index (BMI) 33.0-33.9, adult: Secondary | ICD-10-CM

## 2024-03-23 DIAGNOSIS — Z638 Other specified problems related to primary support group: Secondary | ICD-10-CM

## 2024-03-23 DIAGNOSIS — Z813 Family history of other psychoactive substance abuse and dependence: Secondary | ICD-10-CM

## 2024-03-23 DIAGNOSIS — M21372 Foot drop, left foot: Secondary | ICD-10-CM | POA: Diagnosis present

## 2024-03-23 DIAGNOSIS — F411 Generalized anxiety disorder: Secondary | ICD-10-CM | POA: Diagnosis present

## 2024-03-23 DIAGNOSIS — Z5986 Financial insecurity: Secondary | ICD-10-CM | POA: Diagnosis not present

## 2024-03-23 DIAGNOSIS — Z8249 Family history of ischemic heart disease and other diseases of the circulatory system: Secondary | ICD-10-CM

## 2024-03-23 DIAGNOSIS — E669 Obesity, unspecified: Secondary | ICD-10-CM | POA: Diagnosis present

## 2024-03-23 DIAGNOSIS — E039 Hypothyroidism, unspecified: Secondary | ICD-10-CM | POA: Diagnosis present

## 2024-03-23 DIAGNOSIS — K0889 Other specified disorders of teeth and supporting structures: Secondary | ICD-10-CM | POA: Diagnosis present

## 2024-03-23 DIAGNOSIS — Z79899 Other long term (current) drug therapy: Secondary | ICD-10-CM

## 2024-03-23 DIAGNOSIS — Z818 Family history of other mental and behavioral disorders: Secondary | ICD-10-CM | POA: Diagnosis not present

## 2024-03-23 DIAGNOSIS — R45851 Suicidal ideations: Principal | ICD-10-CM | POA: Diagnosis present

## 2024-03-23 DIAGNOSIS — Z79890 Hormone replacement therapy (postmenopausal): Secondary | ICD-10-CM

## 2024-03-23 DIAGNOSIS — F332 Major depressive disorder, recurrent severe without psychotic features: Secondary | ICD-10-CM | POA: Diagnosis present

## 2024-03-23 LAB — FOLATE: Folate: 13.5 ng/mL (ref >5.9)

## 2024-03-23 LAB — VITAMIN B12: Vitamin B-12: 415 pg/mL (ref 180–914)

## 2024-03-23 LAB — VITAMIN D 25 HYDROXY (VIT D DEFICIENCY, FRACTURES): Vit D, 25-Hydroxy: 38.09 ng/mL (ref 30–100)

## 2024-03-23 LAB — RPR
RPR Ser Ql: REACTIVE — AB
RPR Titer: 1:1 {titer}

## 2024-03-23 LAB — HEMOGLOBIN A1C
Hgb A1c MFr Bld: 5.7 % — ABNORMAL HIGH (ref 4.8–5.6)
Mean Plasma Glucose: 117 mg/dL

## 2024-03-23 MED ORDER — ALUM & MAG HYDROXIDE-SIMETH 200-200-20 MG/5ML PO SUSP: 30.0000 mL | ORAL | Status: DC | PRN

## 2024-03-23 MED ORDER — LORAZEPAM 2 MG/ML IJ SOLN: 2.0000 mg | Freq: Three times a day (TID) | INTRAMUSCULAR | Status: DC | PRN

## 2024-03-23 MED ORDER — HALOPERIDOL LACTATE 5 MG/ML IJ SOLN: 5.0000 mg | Freq: Three times a day (TID) | INTRAMUSCULAR | Status: DC | PRN

## 2024-03-23 MED ORDER — MAGNESIUM HYDROXIDE 400 MG/5ML PO SUSP: 30.0000 mL | Freq: Every day | ORAL | Status: DC | PRN

## 2024-03-23 MED ORDER — DIPHENHYDRAMINE HCL 25 MG PO CAPS
50.0000 mg | ORAL_CAPSULE | Freq: Three times a day (TID) | ORAL | Status: DC | PRN
Start: 2024-03-23 — End: 2024-03-29
  Administered 2024-03-24 – 2024-03-28 (×2): 50 mg via ORAL
  Filled 2024-03-23 (×2): qty 2

## 2024-03-23 MED ORDER — HALOPERIDOL 5 MG PO TABS
5.0000 mg | ORAL_TABLET | Freq: Three times a day (TID) | ORAL | Status: DC | PRN
Start: 2024-03-23 — End: 2024-03-29
  Administered 2024-03-24 – 2024-03-28 (×2): 5 mg via ORAL
  Filled 2024-03-23 (×2): qty 1

## 2024-03-23 MED ORDER — HALOPERIDOL LACTATE 5 MG/ML IJ SOLN: 10.0000 mg | Freq: Three times a day (TID) | INTRAMUSCULAR | Status: DC | PRN

## 2024-03-23 MED ORDER — ACETAMINOPHEN 325 MG PO TABS
650.0000 mg | ORAL_TABLET | Freq: Four times a day (QID) | ORAL | Status: DC | PRN
  Administered 2024-03-26 – 2024-03-29 (×5): 650 mg via ORAL
  Filled 2024-03-23 (×6): qty 2

## 2024-03-23 MED ORDER — DIPHENHYDRAMINE HCL 50 MG/ML IJ SOLN: 50.0000 mg | Freq: Three times a day (TID) | INTRAMUSCULAR | Status: DC | PRN

## 2024-03-23 MED ORDER — HYDROXYZINE HCL 25 MG PO TABS
25.0000 mg | ORAL_TABLET | Freq: Three times a day (TID) | ORAL | Status: DC | PRN
Start: 2024-03-23 — End: 2024-03-29
  Administered 2024-03-24 – 2024-03-28 (×6): 25 mg via ORAL
  Filled 2024-03-23 (×8): qty 1

## 2024-03-23 NOTE — Plan of Care (Signed)

## 2024-03-23 NOTE — ED Provider Notes (Signed)
 [facility]BS ASAP Discharge Summary  Date and Time: 03/23/2024 12:56 PM  Name: Joyce [name]  MRN:  [id number]   Discharge Diagnoses:  Final diagnoses:  Severe episode of recurrent major depressive disorder, without psychotic features (HCC)    Subjective:  Feeling sad this morning but denies thoughts of SI, HI, auditory and visual hallucinations. Denied somatic symptomatology at present. Slept OK. Asked after breakfast options. Feels that this current episode of worsening depression and thoughts of SI with plan to slit her throat with a kitchen knife from last night was brought on by not getting along with parents recently. Feels as if she is overwhelmed and had a mental breakdown yesterday. Reports adherence to her medications, including Abilify  and Auvelity . With regards to her parents: they try their best to help, but their suggestions are not actually helpful. Feels an inpatient admission would help her at this time, and would not feel safe returning home. Gave verbal permission to reach out to parents if necessary.   Charmaine Forst [phone]): Father dropped her off here. Therapist recommended re-presentation to [facility]. Yesterday, patient went to work and returned home. Patient was admitted to [facility] for observation with eye towards inpatient treatment on 7/9 after increased depression along with suicidal thinking. Patient gets overwhelmed when asked to perform simple task. Patient said that she's tired and cannot deal with everything's that's going on. Was supposed to go to an interview, and felt that she couldn't follow through. Felt as if she was letting everyone down. Endorsed that she doesn't want to be here anymore. Endorses suicidal thinking/suicide attempts in the past. Patient has been taking her medicines for the past month regularly, still having mood swings. Therapist a week or two ago suggested Kaisyn be tested for autism. Wants to leave home and be  independent. There are guns at home, locked up. No safety concerns at home.   Stay Summary:   Total Time spent with patient: 30 minutes  Past Psychiatric History:  -MDD: 1 prior inpatient hospitalization for SI with plan in Oct 2024 at behavioral health [facility].  Was discharged on Abilify  10 mg daily and Auvelity  45-105 mg twice daily, which she is still prescribed.  Has tried prozac  in the past but stopped because it made her feel dizzy and sleepy. Was also not able to tolerate Zoloft . -GAD -Bipolar: documented history of Bipolar but has denied mania in the past Past Medical History: Hypothyroidism, on levothyroxine  Family Psychiatric  History:  Both parents have depression, taking medications. Social History: Lives with parents  Current Medications:  Current Facility-Administered Medications  Medication Dose Route Frequency Provider Last Rate Last Admin   acetaminophen  (TYLENOL ) tablet 650 mg  650 mg Oral Q6H PRN Randall Starlyn HERO, NP   650 mg at 03/22/24 2134   alum & mag hydroxide-simeth (MAALOX/MYLANTA) 200-200-20 MG/5ML suspension 30 mL  30 mL Oral Q4H PRN Randall Starlyn HERO, NP       haloperidol  (HALDOL ) tablet 5 mg  5 mg Oral TID PRN Randall Starlyn HERO, NP   5 mg at 03/22/24 2134   And   diphenhydrAMINE  (BENADRYL ) capsule 50 mg  50 mg Oral TID PRN Randall Starlyn HERO, NP   50 mg at 03/22/24 2134   haloperidol  lactate (HALDOL ) injection 5 mg  5 mg Intramuscular TID PRN Randall Starlyn HERO, NP       And   diphenhydrAMINE  (BENADRYL ) injection 50 mg  50 mg Intramuscular TID PRN Randall Starlyn HERO, NP  And   LORazepam  (ATIVAN ) injection 2 mg  2 mg Intramuscular TID PRN Byungura, Veronique M, NP       haloperidol  lactate (HALDOL ) injection 10 mg  10 mg Intramuscular TID PRN Randall Starlyn HERO, NP       And   diphenhydrAMINE  (BENADRYL ) injection 50 mg  50 mg Intramuscular TID PRN Randall Starlyn HERO, NP       And   LORazepam  (ATIVAN ) injection 2 mg  2 mg  Intramuscular TID PRN Randall Starlyn HERO, NP       magnesium  hydroxide (MILK OF MAGNESIA) suspension 30 mL  30 mL Oral Daily PRN Randall, Veronique M, NP       traZODone  (DESYREL ) tablet 100 mg  100 mg Oral QHS PRN Randall Starlyn HERO, NP       Current Outpatient Medications  Medication Sig Dispense Refill   ARIPiprazole  (ABILIFY ) 10 MG tablet Take 1 tablet (10 mg total) by mouth daily. 30 tablet 3   Dextromethorphan -buPROPion  ER (AUVELITY ) 45-105 MG TBCR Take one tablet by mouth twice daily 8 hours between doses. (Patient taking differently: Take 1 tablet by mouth 2 (two) times daily.) 60 tablet 0   hydrOXYzine  (ATARAX ) 25 MG tablet Take 1 tablet (25 mg total) by mouth 3 (three) times daily as needed for anxiety. 30 tablet 0   levothyroxine  (SYNTHROID ) 100 MCG tablet Take 100 mcg by mouth every morning.     traZODone  (DESYREL ) 100 MG tablet Take 1 tablet (100 mg total) by mouth at bedtime as needed for sleep. 30 tablet 0   albuterol (VENTOLIN HFA) 108 (90 Base) MCG/ACT inhaler Inhale 2 puffs into the lungs every 4 (four) hours as needed. (Patient not taking: Reported on 03/23/2024)     ipratropium (ATROVENT) 0.06 % nasal spray Place 2 sprays into both nostrils 4 (four) times daily. (Patient not taking: Reported on 03/23/2024)      PTA Medications:  Facility Ordered Medications  Medication   acetaminophen  (TYLENOL ) tablet 650 mg   alum & mag hydroxide-simeth (MAALOX/MYLANTA) 200-200-20 MG/5ML suspension 30 mL   magnesium  hydroxide (MILK OF MAGNESIA) suspension 30 mL   haloperidol  (HALDOL ) tablet 5 mg   And   diphenhydrAMINE  (BENADRYL ) capsule 50 mg   haloperidol  lactate (HALDOL ) injection 5 mg   And   diphenhydrAMINE  (BENADRYL ) injection 50 mg   And   LORazepam  (ATIVAN ) injection 2 mg   haloperidol  lactate (HALDOL ) injection 10 mg   And   diphenhydrAMINE  (BENADRYL ) injection 50 mg   And   LORazepam  (ATIVAN ) injection 2 mg   traZODone  (DESYREL ) tablet 100 mg   PTA Medications   Medication Sig   traZODone  (DESYREL ) 100 MG tablet Take 1 tablet (100 mg total) by mouth at bedtime as needed for sleep.   hydrOXYzine  (ATARAX ) 25 MG tablet Take 1 tablet (25 mg total) by mouth 3 (three) times daily as needed for anxiety.   ARIPiprazole  (ABILIFY ) 10 MG tablet Take 1 tablet (10 mg total) by mouth daily.   Dextromethorphan -buPROPion  ER (AUVELITY ) 45-105 MG TBCR Take one tablet by mouth twice daily 8 hours between doses. (Patient taking differently: Take 1 tablet by mouth 2 (two) times daily.)   levothyroxine  (SYNTHROID ) 100 MCG tablet Take 100 mcg by mouth every morning.   albuterol (VENTOLIN HFA) 108 (90 Base) MCG/ACT inhaler Inhale 2 puffs into the lungs every 4 (four) hours as needed. (Patient not taking: Reported on 03/23/2024)   ipratropium (ATROVENT) 0.06 % nasal spray Place 2 sprays into both nostrils 4 (four)  times daily. (Patient not taking: Reported on 03/23/2024)       03/22/2024    5:34 PM 06/17/2023   10:57 AM 09/02/2021    4:48 AM  Depression screen PHQ 2/9  Decreased Interest 2 2 2   Down, Depressed, Hopeless 2 3 2   PHQ - 2 Score 4 5 4   Altered sleeping 2 2 2   Tired, decreased energy 2 2 2   Change in appetite 0 2 0  Feeling bad or failure about yourself  3 3 2   Trouble concentrating 1 3 2   Moving slowly or fidgety/restless 1 3 1   Suicidal thoughts 2 3 1   PHQ-9 Score 15 23 14   Difficult doing work/chores Very difficult  Extremely dIfficult    Flowsheet Row ED from 03/22/2024 in [facility] Most recent reading at 03/22/2024 11:07 PM Ad[facility] (Discharged[facility] Most recent reading at 06/17/2023 11:35 PM ED from 06/17/2023 in [facility] Most recent reading at 06/17/2023 11:53 AM  C-SSRS RISK CATEGORY Error: Q7 should not be populated when Q6 is No Low Risk High Risk    Musculoskeletal  Strength & Muscle Tone: within normal limits Gait & Station:  normal Patient leans: N/A  Psychiatric Specialty Exam  Presentation  General Appearance:  Appropriate for Environment  Eye Contact: Fleeting (Sleepy)  Speech: Clear and Coherent  Speech Volume: -- (Soft, Appropriate for environment)  Handedness: Right   Mood and Affect  Mood: Depressed (Sad)  Affect: Depressed; Congruent   Thought Process  Thought Processes: Coherent; Linear  Descriptions of Associations:Intact  Orientation:-- (Grossly Oriented)  Thought Content:Logical  Diagnosis of Schizophrenia or Schizoaffective disorder in past: No    Hallucinations:Hallucinations: None  Ideas of Reference:None  Suicidal Thoughts:Suicidal Thoughts: No SI Active Intent and/or Plan: With Plan  Homicidal Thoughts:Homicidal Thoughts: No   Sensorium  Memory: Immediate Fair  Judgment: Fair  Insight: Fair   Executive Functions  Concentration: -- (Grossly Intact)  Attention Span: -- (Grossly intact)  Recall: [employer] of Knowledge: Fair  Language: Fair   Psychomotor Activity  Psychomotor Activity: Psychomotor Activity: Restlessness   Assets  Assets: Housing; Desire for Improvement; Communication Skills   Sleep  Sleep: Sleep: Fair  Estimated Sleeping Duration (Last 24 Hours): 0.00 hours  Nutritional Assessment (For OBS and [facility]BC admissions only) Has the patient had a weight loss or gain of 10 pounds or more in the last 3 months?: No Has the patient had a decrease in food intake/or appetite?: No Does the patient have dental problems?: No Does the patient have eating habits or behaviors that may be indicators of an eating disorder including binging or inducing vomiting?: No Has the patient recently lost weight without trying?: 0 Has the patient been eating poorly because of a decreased appetite?: 0 Malnutrition Screening Tool Score: 0    Physical Exam  Physical Exam Vitals reviewed.  Constitutional:      General: She is not in  acute distress. Pulmonary:     Effort: Pulmonary effort is normal. No respiratory distress.  Neurological:     Mental Status: She is alert.    Review of Systems  Constitutional:  Negative for chills and fever.  Psychiatric/Behavioral:  Positive for depression.   All other systems reviewed and are negative.  Blood pressure 115/84, pulse 79, temperature 98.1 F (36.7 C), resp. rate 18, SpO2 100%. There is no height or weight on file to calculate BMI.  Demographic Factors:  Adolescent  or young adult  Loss Factors: Decrease in vocational status  Historical Factors: Family history of mental illness or substance abuse and Impulsivity  Risk Reduction Factors:   Living with another person, especially a relative, Positive social support, and Positive therapeutic relationship  Continued Clinical Symptoms:  Depression:   Hopelessness Impulsivity Severe  Cognitive Features That Contribute To Risk:  Loss of executive function    Suicide Risk:  Moderate:  Recurrijng suicidal ideation with limited intensity, and duration, some specificity in terms of plans, some associated intent, reasonable self-control, limited dysphoria/symptomatology, some risk factors present, and identifiable protective factors, including available and accessible social support.  Plan Of Care/Follow-up recommendations:   Follow-up recommendations:  Activity:  Normal, as tolerated Diet:  Per PCP recommendation  Patient is instructed prior to discharge to: Take all medications as prescribed by her mental healthcare provider. Report any adverse effects and/or reactions from the medicines to her outpatient provider promptly. Patient has been instructed & cautioned: To not engage in alcohol and or illegal drug use while on prescription medicines.  In the event of worsening symptoms, patient is instructed to call the crisis hotline at 988, 911 and or go to the nearest ED for appropriate evaluation and treatment of  symptoms. To follow-up with her primary care provider for your other medical issues, concerns and or health care needs.   Disposition: [facility]  Kecia Swoboda, MD 03/23/2024, 12:56 PM

## 2024-03-23 NOTE — ED Notes (Signed)
 Patient calm and cooperative. Patient currently resting in recliner. RR even and unlabored, appearing in no noted distress. Environmental check complete.

## 2024-03-23 NOTE — Plan of Care (Signed)

## 2024-03-23 NOTE — Progress Notes (Signed)
 Pt has been accepted to [facility] on 03/23/2024 Bed assignment: 305-02  Pt meets inpatient criteria per: Morene Cleveland MD  Attending Physician will be: Dr. Prentis    Report can be called to: Adult unit: [phone]  Pt can arrive after   Care Team Notified:  [facility] Bretta Qua RN/ Randi McCombs RN  [place] Taija Mathias LCSW-A   03/23/2024 2:33 PM

## 2024-03-23 NOTE — Tx Team (Signed)
 Initial Treatment Plan 03/23/2024 5:39 PM [name] FMW:[id number]    PATIENT STRESSORS: Marital or family conflict     PATIENT STRENGTHS: Communication skills  General fund of knowledge  Motivation for treatment/growth    PATIENT IDENTIFIED PROBLEMS: depression                     DISCHARGE CRITERIA:  Improved stabilization in mood, thinking, and/or behavior Need for constant or close observation no longer present  PRELIMINARY DISCHARGE PLAN: Return to previous living arrangement  PATIENT/FAMILY INVOLVEMENT: This treatment plan has been presented to and reviewed with the patient, [name].  The patient has been given the opportunity to ask questions and make suggestions.  [place]ell JAYSON Sharps, RN 03/23/2024, 5:39 PM

## 2024-03-23 NOTE — Progress Notes (Signed)
 Patient admitted to 305-2 for [facility] to cut throat with a kitchen knife. Patient denies current SI, HI, AVH. Per report mother is guardian. This RN attempted to call mother twice and it went straight to voicemail. Mother: Zaiah Credeur [phone]. Patient is calm and cooperative. UDS negative. Patient denies drug/alcohol/nicotine use. Skin and belongings searched. Skin assessment unremarkable. Patient is wearing a boot on left foot/ankle. Patient reports she has a fracture. Order obtained from MD for patient to have boot on unit. Patient is a high fall risk and a regular diet. She reports she lives with her parents and works two jobs. She reports she drives. She reports feeling sluggish recently. Patient oriented to unit. Q15 minute safety checks started. Food and drink offered. Safety maintained will continue to monitor.

## 2024-03-23 NOTE — ED Notes (Signed)
 Pt awake and reports sleeping well. I am stili tired. Pt is calm on approach and denies si/hi/avh. Reports still depressed and would like further inpatient treatment. Appetite fair, she consumed 2 bowls of cereal without issue. Behavior is controlled. Will continue to monitor and report changes as noted. Safety maintained.

## 2024-03-23 NOTE — ED Provider Notes (Addendum)
 Behavioral Health Progress Note  Date and Time: 03/23/2024 10:08 AM Name: Joyce [name] MRN:  [id number]  Subjective:  Feeling sad this morning but denies thoughts of SI, HI, auditory and visual hallucinations. Denied somatic symptomatology at present. Slept OK. Asked after breakfast options. Feels that this current episode of worsening depression and thoughts of SI with plan to slit her throat with a kitchen knife from last night was brought on by not getting along with parents recently. Feels as if she is overwhelmed and had a mental breakdown yesterday. Reports adherence to her medications, including Abilify  and Auvelity . With regards to her parents: they try their best to help, but their suggestions are not actually helpful. Feels an inpatient admission would help her at this time, and would not feel safe returning home. Gave verbal permission to reach out to parents if necessary.   Diagnosis:  Final diagnoses:  Severe episode of recurrent major depressive disorder, without psychotic features (HCC)    Total Time spent with patient: 15 minutes  Past Psychiatric History:  -MDD: two prior inpatient hospitalizations for SI with plan in July 2022, Oct 2024. Currently prescribed Abilify  & Auvelity . Has tried prozac  in the past but stopped because it made her feel dizzy and sleepy. Was also not able to tolerate Zoloft . -GAD -Bipolar: documented history of Bipolar but has denied mania in the past Past Medical History: Hypothyroidism, on levothyroxine  Family Psychiatric  History:  Both parents have depression, taking medications  Social History: Lives with parents  Sleep: Fair  Appetite:  Fair  Current Medications:  Current Facility-Administered Medications  Medication Dose Route Frequency Provider Last Rate Last Admin   acetaminophen  (TYLENOL ) tablet 650 mg  650 mg Oral Q6H PRN Randall Starlyn HERO, NP   650 mg at 03/22/24 2134   alum & mag hydroxide-simeth (MAALOX/MYLANTA)  200-200-20 MG/5ML suspension 30 mL  30 mL Oral Q4H PRN Randall Starlyn HERO, NP       haloperidol  (HALDOL ) tablet 5 mg  5 mg Oral TID PRN Randall Starlyn HERO, NP   5 mg at 03/22/24 2134   And   diphenhydrAMINE  (BENADRYL ) capsule 50 mg  50 mg Oral TID PRN Randall Starlyn HERO, NP   50 mg at 03/22/24 2134   haloperidol  lactate (HALDOL ) injection 5 mg  5 mg Intramuscular TID PRN Randall Starlyn HERO, NP       And   diphenhydrAMINE  (BENADRYL ) injection 50 mg  50 mg Intramuscular TID PRN Randall Starlyn HERO, NP       And   LORazepam  (ATIVAN ) injection 2 mg  2 mg Intramuscular TID PRN Randall Starlyn HERO, NP       haloperidol  lactate (HALDOL ) injection 10 mg  10 mg Intramuscular TID PRN Randall Starlyn HERO, NP       And   diphenhydrAMINE  (BENADRYL ) injection 50 mg  50 mg Intramuscular TID PRN Randall Starlyn HERO, NP       And   LORazepam  (ATIVAN ) injection 2 mg  2 mg Intramuscular TID PRN Randall, Veronique M, NP       magnesium  hydroxide (MILK OF MAGNESIA) suspension 30 mL  30 mL Oral Daily PRN Randall, Veronique M, NP       traZODone  (DESYREL ) tablet 100 mg  100 mg Oral QHS PRN Randall Starlyn HERO, NP       Current Outpatient Medications  Medication Sig Dispense Refill   albuterol (VENTOLIN HFA) 108 (90 Base) MCG/ACT inhaler Inhale 2 puffs into the lungs every 4 (four) hours as  needed.     ipratropium (ATROVENT) 0.06 % nasal spray Place 2 sprays into both nostrils 4 (four) times daily.     levothyroxine  (SYNTHROID ) 100 MCG tablet Take 100 mcg by mouth every morning.     naproxen (NAPROSYN) 500 MG tablet Take 500 mg by mouth every 12 (twelve) hours as needed (For pain).     ARIPiprazole  (ABILIFY ) 10 MG tablet Take 1 tablet (10 mg total) by mouth daily. 30 tablet 3   Dextromethorphan -buPROPion  ER (AUVELITY ) 45-105 MG TBCR Take one tablet by mouth twice daily 8 hours between doses. 60 tablet 0   hydrOXYzine  (ATARAX ) 25 MG tablet Take 1 tablet (25 mg total) by mouth 3 (three) times  daily as needed for anxiety. 30 tablet 0   traZODone  (DESYREL ) 100 MG tablet Take 1 tablet (100 mg total) by mouth at bedtime as needed for sleep. 30 tablet 0    Labs  Lab Results:  Admission on 03/22/2024  Component Date Value Ref Range Status   WBC 03/22/2024 11.5 (H)  4.0 - 10.5 K/uL Final   RBC 03/22/2024 4.97  3.87 - 5.11 MIL/uL Final   Hemoglobin 03/22/2024 13.8  12.0 - 15.0 g/dL Final   HCT 92/90/7974 43.6  36.0 - 46.0 % Final   MCV 03/22/2024 87.7  80.0 - 100.0 fL Final   MCH 03/22/2024 27.8  26.0 - 34.0 pg Final   MCHC 03/22/2024 31.7  30.0 - 36.0 g/dL Final   RDW 92/90/7974 15.3  11.5 - 15.5 % Final   Platelets 03/22/2024 296  150 - 400 K/uL Final   nRBC 03/22/2024 0.0  0.0 - 0.2 % Final   Neutrophils Relative % 03/22/2024 56  % Final   Neutro Abs 03/22/2024 6.5  1.7 - 7.7 K/uL Final   Lymphocytes Relative 03/22/2024 36  % Final   Lymphs Abs 03/22/2024 4.2 (H)  0.7 - 4.0 K/uL Final   Monocytes Relative 03/22/2024 6  % Final   Monocytes Absolute 03/22/2024 0.7  0.1 - 1.0 K/uL Final   Eosinophils Relative 03/22/2024 1  % Final   Eosinophils Absolute 03/22/2024 0.1  0.0 - 0.5 K/uL Final   Basophils Relative 03/22/2024 1  % Final   Basophils Absolute 03/22/2024 0.1  0.0 - 0.1 K/uL Final   WBC Morphology 03/22/2024 MORPHOLOGY UNREMARKABLE   Final   RBC Morphology 03/22/2024 MORPHOLOGY UNREMARKABLE   Final   Smear Review 03/22/2024 MORPHOLOGY UNREMARKABLE   Final   Immature Granulocytes 03/22/2024 0  % Final   Abs Immature Granulocytes 03/22/2024 0.02  0.00 - 0.07 K/uL Final   Performed at [facility], [address] [place], [place] [address]   Sodium 03/22/2024 139  135 - 145 mmol/L Final   Potassium 03/22/2024 4.4  3.5 - 5.1 mmol/L Final   Chloride 03/22/2024 102  98 - 111 mmol/L Final   CO2 03/22/2024 27  22 - 32 mmol/L Final   Glucose, Bld 03/22/2024 71  70 - 99 mg/dL Final   Glucose reference range applies only to samples taken after fasting for at least 8  hours.   BUN 03/22/2024 7  6 - 20 mg/dL Final   Creatinine, Ser 03/22/2024 0.74  0.44 - 1.00 mg/dL Final   Calcium 92/90/7974 9.3  8.9 - 10.3 mg/dL Final   Total Protein 92/90/7974 7.2  6.5 - 8.1 g/dL Final   Albumin 92/90/7974 3.5  3.5 - 5.0 g/dL Final   AST 92/90/7974 21  15 - 41 U/L Final  ALT 03/22/2024 13  0 - 44 U/L Final   Alkaline Phosphatase 03/22/2024 78  38 - 126 U/L Final   Total Bilirubin 03/22/2024 0.4  0.0 - 1.2 mg/dL Final   GFR, Estimated 03/22/2024 >60  >60 mL/min Final   Comment: (NOTE) Calculated using the CKD-EPI Creatinine Equation (2021)    Anion gap 03/22/2024 10  5 - 15 Final   Performed at [facility], [address] [place], [place] [address]   Magnesium  03/22/2024 1.9  1.7 - 2.4 mg/dL Final   Performed at [facility], [address] [place], [place] [address]   Alcohol, Ethyl (B) 03/22/2024 <15  <15 mg/dL Final   Comment: (NOTE) For medical purposes only. Performed at [facility], [address] [place] [employer] [place], [place] [address]    Cholesterol 03/22/2024 113  0 - 200 mg/dL Final   Triglycerides 92/90/7974 65  <150 mg/dL Final   HDL 92/90/7974 53  >40 mg/dL Final   Total CHOL/HDL Ratio 03/22/2024 2.1  RATIO Final   VLDL 03/22/2024 13  0 - 40 mg/dL Final   LDL Cholesterol 03/22/2024 47  0 - 99 mg/dL Final   Comment:        Total Cholesterol/HDL:CHD Risk Coronary Heart Disease Risk Table                     Men   Women  1/2 Average Risk   3.4   3.3  Average Risk       5.0   4.4  2 X Average Risk   9.6   7.1  3 X Average Risk  23.4   11.0        Use the calculated Patient Ratio above and the CHD Risk Table to determine the patient's CHD Risk.        ATP III CLASSIFICATION (LDL):  <100     mg/dL   Optimal  899-870  mg/dL   Near or Above                    Optimal  130-159  mg/dL   Borderline  839-810  mg/dL   High  >809     mg/dL   Very High Performed at [facility], [address] [place], [place] [address]    TSH  03/22/2024 1.472  0.350 - 4.500 uIU/mL Final   Comment: Performed by a 3rd Generation assay with a functional sensitivity of <=0.01 uIU/mL. Performed at [facility], [address], [place], [place] [address]    Color, Urine 03/22/2024 YELLOW  YELLOW Final   APPearance 03/22/2024 HAZY (A)  CLEAR Final   Specific Gravity, Urine 03/22/2024 1.019  1.005 - 1.030 Final   pH 03/22/2024 5.0  5.0 - 8.0 Final   Glucose, UA 03/22/2024 NEGATIVE  NEGATIVE mg/dL Final   Hgb urine dipstick 03/22/2024 NEGATIVE  NEGATIVE Final   Bilirubin Urine 03/22/2024 NEGATIVE  NEGATIVE Final   Ketones, ur 03/22/2024 NEGATIVE  NEGATIVE mg/dL Final   Protein, ur 92/90/7974 NEGATIVE  NEGATIVE mg/dL Final   Nitrite 92/90/7974 NEGATIVE  NEGATIVE Final   Leukocytes,Ua 03/22/2024 NEGATIVE  NEGATIVE Final   Performed at [facility], [address] [place], [place] [address]   Preg Test, Ur 03/22/2024 Negative  Negative Final   POC Amphetamine UR 03/22/2024 None Detected  NONE DETECTED (Cut Off Level 1000 ng/mL) Final   POC Secobarbital (BAR) 03/22/2024 None Detected  NONE  DETECTED (Cut Off Level 300 ng/mL) Final   POC Buprenorphine (BUP) 03/22/2024 None Detected  NONE DETECTED (Cut Off Level 10 ng/mL) Final   POC Oxazepam (BZO) 03/22/2024 None Detected  NONE DETECTED (Cut Off Level 300 ng/mL) Final   POC Cocaine UR 03/22/2024 None Detected  NONE DETECTED (Cut Off Level 300 ng/mL) Final   POC Methamphetamine UR 03/22/2024 None Detected  NONE DETECTED (Cut Off Level 1000 ng/mL) Final   POC Morphine  03/22/2024 None Detected  NONE DETECTED (Cut Off Level 300 ng/mL) Final   POC Methadone UR 03/22/2024 None Detected  NONE DETECTED (Cut Off Level 300 ng/mL) Final   POC Oxycodone  UR 03/22/2024 None Detected  NONE DETECTED (Cut Off Level 100 ng/mL) Final   POC Marijuana UR 03/22/2024 None Detected  NONE DETECTED (Cut Off Level 50 ng/mL) Final    Blood Alcohol level:  Lab Results  Component Value Date   [facility] <15  03/22/2024    Metabolic Disorder Labs: Lab Results  Component Value Date   HGBA1C 5.6 06/17/2023   MPG 114.02 06/17/2023   MPG 117 01/15/2023   No results found for: PROLACTIN Lab Results  Component Value Date   CHOL 113 03/22/2024   TRIG 65 03/22/2024   HDL 53 03/22/2024   CHOLHDL 2.1 03/22/2024   VLDL 13 03/22/2024   LDLCALC 47 03/22/2024   LDLCALC 47 06/19/2023    Therapeutic Lab Levels: No results found for: LITHIUM No results found for: VALPROATE No results found for: CBMZ  Physical Findings   AIMS    Flowsheet Row [facility] Ad[facility] (Discharged) from OP [facility]  AIMS Total Score 0 0   AUDIT    Flowsheet Row [facility] Admission (Discharged) from OP [facility]  Alcohol Use Disorder Identification Test Final Score (AUDIT) 0 0   PHQ2-9    Flowsheet Row ED from 03/22/2024 in [facility] ED from 06/17/2023 in [facility] from 09/02/2021 in [facility]  PHQ-2 Total Score 4 5 4   PHQ-9 Total Score 15 23 14    Flowsheet Row ED from 03/22/2024 in [facility] Most recent reading at 03/22/2024 11:07 PM Ad[facility] (Discharged[facility] Most recent reading at 06/17/2023 11:35 PM ED from 06/17/2023 in [facility] Most recent reading at 06/17/2023 11:53 AM  C-SSRS RISK CATEGORY Error: Q7 should not be populated when Q6 is No Low Risk High Risk     Musculoskeletal  Strength & Muscle Tone: Not assessed Gait & Station: Not assessed Patient leans: Not assessed  Psychiatric Specialty Exam  Presentation   General Appearance:  Appropriate for Environment  Eye Contact: Fleeting (Sleepy)  Speech: Clear and Coherent  Speech Volume: -- (Soft, Appropriate for environment)  Handedness: Right   Mood and Affect  Mood: Depressed (Sad)  Affect: Depressed; Congruent   Thought Process  Thought Processes: Coherent; Linear  Descriptions of Associations:Intact  Orientation:-- (Grossly Oriented)  Thought Content:Logical  Diagnosis of Schizophrenia or Schizoaffective disorder in past: No    Hallucinations:Hallucinations: None  Ideas of Reference:None  Suicidal Thoughts:Suicidal Thoughts: No SI Active Intent and/or Plan: With Plan  Homicidal Thoughts:Homicidal Thoughts: No   Sensorium  Memory: Immediate Fair  Judgment: Fair  Insight: Fair   Chartered certified accountant: -- (Grossly Intact)  Attention Span: -- (Grossly intact)  Recall: [employer] of Knowledge: Fair  Language: Fair   Psychomotor Activity  Psychomotor Activity: Psychomotor Activity: Restlessness   Assets  Assets: Housing; Desire for Improvement; Communication Skills   Sleep  Sleep: Sleep: Fair  No Safety Checks orders active in given range  Nutritional Assessment (For OBS and [facility] admissions only) Has the patient had a weight loss or gain of 10 pounds or more in the last 3 months?: No Has the patient had a decrease in food intake/or appetite?: No Does the patient have dental problems?: No Does the patient have eating habits or behaviors that may be indicators of an eating disorder including binging or inducing vomiting?: No Has the patient recently lost weight without trying?: 0 Has the patient been eating poorly because of a decreased appetite?: 0 Malnutrition Screening Tool Score: 0    Physical Exam  Physical Exam Vitals reviewed.  Constitutional:      General: She is not in acute distress. HENT:     Head: Normocephalic and atraumatic.  Pulmonary:      Effort: Pulmonary effort is normal.  Musculoskeletal:     Cervical back: Normal range of motion.  Neurological:     General: No focal deficit present.    Review of Systems  Constitutional:  Negative for chills and fever.  Gastrointestinal:  Negative for abdominal pain, nausea and vomiting.   Blood pressure 115/84, pulse 79, temperature 98.1 F (36.7 C), resp. rate 18, SpO2 100%. There is no height or weight on file to calculate BMI.  Treatment Plan Summary: Daily contact with patient to assess and evaluate symptoms and progress in treatment and Plan Voluntary inpatient admission. Will defer restarting medications in urgent care setting for now.  Dorothyann OLEGARIO Cowing, Medical Student 03/23/2024 10:08 AM  I agree with the excellent medical student note above and have made changes as appropriate.  Odis Cleveland, MD PGY-2, [facility]

## 2024-03-23 NOTE — Discharge Instructions (Signed)

## 2024-03-23 NOTE — ED Notes (Signed)
 Patient currently sleeping and resting in recliner. Pt observed/assessed in adult observation unit. RR even and unlabored, appearing in no noted distress. Environmental check complete

## 2024-03-23 NOTE — [facility] Group Notes (Signed)
 Psychoeducational Group Note  Date:  03/23/2024 Time:  2000  Group Topic/Focus:  Wrap up group  Participation Level: Did Not Attend  Participation Quality:  Not Applicable  Affect:  Not Applicable  Cognitive:  Not Applicable  Insight:  Not Applicable  Engagement in Group: Not Applicable  Additional Comments:  Did not attend.   Lenora Manuelita RAMAN 03/23/2024, 8:58 PM

## 2024-03-23 NOTE — ED Notes (Addendum)
 Patient sitting in recliner restless. Patient upon assessment began to cry. Patient reported feeling anxious,  and stated that  I feel bad and  I don't know what is wrong with them. PRN medication given.

## 2024-03-24 ENCOUNTER — Encounter (HOSPITAL_COMMUNITY): Payer: Self-pay

## 2024-03-24 DIAGNOSIS — R45851 Suicidal ideations: Secondary | ICD-10-CM | POA: Diagnosis not present

## 2024-03-24 LAB — T.PALLIDUM AB, TOTAL: T Pallidum Abs: NONREACTIVE

## 2024-03-24 MED ORDER — LEVOTHYROXINE SODIUM 100 MCG PO TABS
100.0000 ug | ORAL_TABLET | Freq: Every day | ORAL | Status: DC
  Administered 2024-03-25 – 2024-03-29 (×5): 100 ug via ORAL
  Filled 2024-03-24 (×5): qty 1

## 2024-03-24 MED ORDER — ARIPIPRAZOLE 2 MG PO TABS
2.0000 mg | ORAL_TABLET | Freq: Every day | ORAL | Status: DC
  Administered 2024-03-24 – 2024-03-29 (×6): 2 mg via ORAL
  Filled 2024-03-24 (×6): qty 1

## 2024-03-24 NOTE — Plan of Care (Addendum)
 Pt A & O X3. Denies SI, HI, AVH and pain when assessed. Presents with flat affect, depressed mood and crying spells on interactions I want to go home. I talked to my dad, he wants me to stay and focus on myself.  I want to go home. Pt is guarded, cautious and childlike but has been cooperative with care. Tolerates meals, fluids and medications well. Observed in dayroom at brief intervals this shift. Emotional support, encouragement and reassurance offered. Safety maintained on and off unit.   Problem: Coping: Goal: Ability to verbalize frustrations and anger appropriately will improve Outcome: Progressing   Problem: Health Behavior/Discharge Planning: Goal: Compliance with treatment plan for underlying cause of condition will improve Outcome: Progressing
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
## 2024-03-24 NOTE — Group Note (Signed)
 Recreation Therapy Group Note   Group Topic:Problem Solving  Group Date: 03/24/2024 Start Time: 9057 End Time: 1015 Facilitators: Britny Riel-McCall, LRT,CTRS Location: [address] Dayroom   Group Topic: Communication, Team Building, Problem Solving  Goal Area(s) Addresses:  Patient will effectively work with peer towards shared goal.  Patient will identify skills used to make activity successful.  Patient will identify how skills used during activity can be used to reach post d/c goals.   Behavioral Response:   Intervention: STEM Activity  Activity: Straw Bridge. In teams of 3-5, patients were given 15 plastic drinking straws and an equal length of masking tape. Using the materials provided, patients were instructed to build a free standing bridge-like structure to suspend an everyday item (ex: puzzle box) off of the floor or table surface. All materials were required to be used by the team in their design. LRT facilitated post-activity discussion reviewing team process. Patients were encouraged to reflect how the skills used in this activity can be generalized to daily life post discharge.   Education: Pharmacist, community, Scientist, physiological, Discharge Planning   Education Outcome: Acknowledges education/In group clarification offered/Needs additional education.    Affect/Mood: N/A   Participation Level: Did not attend    Clinical Observations/Individualized Feedback:     Plan: Continue to engage patient in RT group sessions 2-3x/week.   Fallou Hulbert-McCall, LRT,CTRS  03/24/2024 12:48 PM

## 2024-03-24 NOTE — Progress Notes (Signed)
   03/24/24 2030  Psych Admission Type (Psych Patients Only)  Admission Status Voluntary  Psychosocial Assessment  Patient Complaints Depression  Eye Contact Fair  Facial Expression Flat  Affect Appropriate to circumstance  Speech Logical/coherent  Interaction Cautious;Guarded;Childlike  Motor Activity Slow  Appearance/Hygiene In scrubs  Behavior Characteristics Cooperative  Mood Depressed;Pleasant  Aggressive Behavior  Effect No apparent injury  Thought Process  Coherency WDL  Content WDL  Delusions WDL  Perception WDL  Hallucination None reported or observed  Judgment Limited  Confusion WDL  Danger to Self  Current suicidal ideation? Denies  Danger to Others  Danger to Others None reported or observed

## 2024-03-24 NOTE — Group Note (Signed)
 Date:  03/24/2024 Time:  8:17 PM  Group Topic/Focus:  Wrap-Up Group:   The focus of this group is to help patients review their daily goal of treatment and discuss progress on daily workbooks.    Participation Level:  Active  Participation Quality:  Appropriate  Affect:  Appropriate  Cognitive:  Appropriate  Insight: Appropriate  Engagement in Group:  Engaged  Modes of Intervention:  Discussion  Additional Comments:  Pt participated in AA and wrap up group.  Ellouise Dama Molt 03/24/2024, 8:17 PM

## 2024-03-24 NOTE — BHH Suicide Risk Assessment (Signed)
 Suicide Risk Assessment  Admission Assessment    [facility] Admission Suicide Risk Assessment   Nursing information obtained from:  Patient Demographic factors:  Adolescent or young adult Current Mental Status:  Suicidal ideation indicated by others Loss Factors:  NA Historical Factors:  NA Risk Reduction Factors:  Sense of responsibility to family, Employed, Living with another person, especially a relative  Total Time spent with patient: 45 minutes  Principal Problem: Suicidal ideation Diagnosis:  Principal Problem:   Suicidal ideation  Subjective Data: Joyce [name] is a 23 year old African-American female with prior psychiatric diagnoses significant for major depressive disorder recurrent, severe without psychotic features, generalized anxiety disorder, and suicidal ideation who presents voluntarily to [facility] from [facility] behavioral health urgent care for worsening depression resulting in suicidal ideation with plans to 'find a knife and slit her throat' in the context of family altercation and recent break-up with boyfriend.  UDS: No substances detected.  Continued Clinical Symptoms:  Alcohol Use Disorder Identification Test Final Score (AUDIT): 0 The Alcohol Use Disorders Identification Test, Guidelines for Use in Primary Care, Second Edition.  World Science writer [facility]). Score between 0-7:  no or low risk or alcohol related problems. Score between 8-15:  moderate risk of alcohol related problems. Score between 16-19:  high risk of alcohol related problems. Score 20 or above:  warrants further diagnostic evaluation for alcohol dependence and treatment.  CLINICAL FACTORS:   Severe Anxiety and/or Agitation Depression:   Anhedonia Hopelessness Impulsivity Severe More than one psychiatric diagnosis Previous Psychiatric Diagnoses and Treatments Medical Diagnoses and Treatments/Surgeries  Musculoskeletal: Strength & Muscle Tone: within  normal limits Gait & Station: normal Patient leans: N/A  Psychiatric Specialty Exam:  Presentation  General Appearance:  Appropriate for Environment; Casual; Fairly Groomed  Eye Contact: Fair  Speech: Clear and Coherent  Speech Volume: Normal  Handedness: Right  Mood and Affect  Mood: Angry; Anxious; Depressed; Hopeless; Worthless  Affect: Therapist, music Process  Thought Processes: Coherent; Linear  Descriptions of Associations:Intact  Orientation:Full (Time, Place and Person)  Thought Content:Logical  History of Schizophrenia/Schizoaffective disorder:No  Duration of Psychotic Symptoms:No data recorded Hallucinations:Hallucinations: None  Ideas of Reference:None  Suicidal Thoughts:Suicidal Thoughts: No SI Active Intent and/or Plan: -- (denies)  Homicidal Thoughts:Homicidal Thoughts: No  Sensorium  Memory: Immediate Good; Recent Good  Judgment: Fair  Insight: Fair  Art therapist  Concentration: Good  Attention Span: Good  Recall: Fair  Fund of Knowledge: Fair  Language: Good  Psychomotor Activity  Psychomotor Activity: Psychomotor Activity: Normal  Assets  Assets: Communication Skills; Housing; Physical Health; Resilience  Sleep  Sleep: Sleep: Good Number of Hours of Sleep: 6  Physical Exam: Physical Exam Vitals and nursing note reviewed.  Constitutional:      General: She is not in acute distress.    Appearance: She is obese. She is not ill-appearing.  HENT:     Right Ear: External ear normal.     Left Ear: External ear normal.     Nose: Nose normal.     Mouth/Throat:     Mouth: Mucous membranes are moist.     Pharynx: Oropharynx is clear.  Eyes:     Extraocular Movements: Extraocular movements intact.  Cardiovascular:     Rate and Rhythm: Normal rate.     Pulses: Normal pulses.  Pulmonary:     Effort: Pulmonary effort is normal.  Abdominal:     Comments: Deferred  Genitourinary:     Comments: Deferred  Musculoskeletal:        General: Normal range of motion.     Cervical back: Normal range of motion.  Skin:    General: Skin is warm.  Neurological:     General: No focal deficit present.     Mental Status: She is alert and oriented to person, place, and time.  Psychiatric:        Mood and Affect: Mood normal.        Behavior: Behavior normal.        Thought Content: Thought content normal.    Review of Systems  Constitutional:  Negative for chills and fever.  HENT:  Negative for sore throat.   Eyes:  Negative for blurred vision.  Respiratory:  Negative for cough, sputum production, shortness of breath and wheezing.   Cardiovascular:  Negative for chest pain and palpitations.  Gastrointestinal:  Negative for abdominal pain, constipation, diarrhea, heartburn, nausea and vomiting.  Genitourinary:  Negative for dysuria and urgency.  Musculoskeletal:  Negative for falls.       Patient is wearing left ankle boot due to nonhealed fracture since childhood.  Skin:  Negative for itching and rash.  Neurological:  Negative for tingling and headaches.  Endo/Heme/Allergies:        See allergy listing  Psychiatric/Behavioral:  Positive for depression. Negative for hallucinations, substance abuse and suicidal ideas. The patient is nervous/anxious. The patient does not have insomnia.    Blood pressure 112/73, pulse 74, temperature 98.1 F (36.7 C), temperature source Oral, resp. rate 14, height 5' 9 (1.753 m), weight 101.6 kg, SpO2 100%. Body mass index is 33.08 kg/m.  COGNITIVE FEATURES THAT CONTRIBUTE TO RISK:  Polarized thinking    SUICIDE RISK:   Severe:  Frequent, intense, and enduring suicidal ideation, specific plan, no subjective intent, but some objective markers of intent (i.e., choice of lethal method), the method is accessible, some limited preparatory behavior, evidence of impaired self-control, severe dysphoria/symptomatology, multiple risk factors present,  and few if any protective factors, particularly a lack of social support.  PLAN OF CARE: Treatment Plan Summary: Daily contact with patient to assess and evaluate symptoms and progress in treatment and Medication management  Physician Treatment Plan for Primary Diagnosis:   Assessment:  Joyce [name] is a 23 year old African-American female with prior psychiatric diagnoses significant for major depressive disorder recurrent, severe without psychotic features, generalized anxiety disorder, and suicidal ideation who presents voluntarily to [facility] from [facility] behavioral health urgent care for worsening depression resulting in suicidal ideation with plans to 'find a knife and slit her throat' in the context of family altercation and recent break-up with boyfriend.  UDS: No substances detected.  Suicidal ideation GAD MDD recurrent severe without psychotic features  Plans: Medications: Resume home medications --Continue Auvelity  45-105 MG TBCR twice daily (8 hours between doses) for depression and anxiety --Continue Abilify  2 mg p.o. daily to augment antidepressant --Continue hydroxyzine  tablet 25 mg p.o. 3 times daily as needed for anxiety --Continue trazodone  tablet 50 mg p.o. q. nightly as needed for sleep  Medication for other medical problems: -- Continue Synthroid  tablet 100 mcg p.o. daily for hypothyroidism  Other PRN Medications  -Acetaminophen  650 mg every 6 as needed/mild pain  -Maalox 30 mL oral every 4 as needed/digestion  -Magnesium  hydroxide 30 mL daily as needed/mild constipation   --The risks/benefits/side-effects/alternatives to this medication were discussed in detail with the patient and time was given for questions. --The patient consents to medication trial.   --  Metabolic profile and EKG monitoring obtained while on an atypical antipsychotic (BMI: Lipid Panel: HbgA1c: QTc:)   -- Encouraged patient to participate in unit milieu  and in scheduled group therapies   Continue BH Agitation Protocol  --Haldol  5 mg, oral, 3 times daily as needed, mild agitation  --Benadryl  50 mg, oral, 3 times daily as needed, mild agitation                                      OR   --Haldol  injection 5 mg, IM, 3 times daily as needed, moderate agitation  --Benadryl  injection 50 mg, IM, 3 times daily as needed, moderate agitation  --Ativan  injection 2 mg, IM, 3 times daily as needed, moderate agitation                                      OR  --Haldol  injection 10 mg, IM, 3 times daily as needed, severe agitation  --Benadryl  injection 50 mg, IM, 3 times daily as needed, severe agitation  --Ativan  injection 2 mg, IM, 3 times daily as needed, severe agitation   Admission labs reviewed: CMP: Within normal limits.  Lipid profile: Within normal limits.  CBC with differentials: WBC 11.5 elevated, lymphs 4.2 elevated, otherwise normal.  Hemoglobin A1c: 5.7 slight elevation.  TSH: 1.472 normal.  RPR: Reactive.  Urine pregnancy: Negative.  UDS: No substances detected.  Folate: 13.5 normal.  Vitamin D  25-hydroxy: 38.09 normal.  Vitamin B12: 415, normal.  New labs ordered: None  EKG reviewed: Normal sinus rhythm, ventricular rate 66, QT/QTc 402/421.   Safety and Monitoring:  Voluntary admission to inpatient psychiatric unit for safety, stabilization and treatment  Daily contact with patient to assess and evaluate symptoms and progress in treatment  Patient's case to be discussed in multi-disciplinary team meeting  Observation Level : q15 minute checks  Vital signs: q12 hours  Precautions: suicide, but pt currently verbally contracts for safety on unit?   Discharge Planning:  Social work and case management to assist with discharge planning and identification of hospital follow-up needs prior to discharge  Estimated LOS: 5-7?days  Discharge Concerns: Need to establish a safety plan; Medication compliance and effectiveness  Discharge Goals:  Return home with outpatient referrals for mental health follow-up including medication management/psychotherapy.   Long Term Goal(s): Improvement in symptoms so as ready for discharge  Short Term Goals: Ability to identify changes in lifestyle to reduce recurrence of condition will improve, Ability to verbalize feelings will improve, Ability to disclose and discuss suicidal ideas, Ability to demonstrate self-control will improve, Ability to identify and develop effective coping behaviors will improve, Ability to maintain clinical measurements within normal limits will improve, Compliance with prescribed medications will improve, and Ability to identify triggers associated with substance abuse/mental health issues will improve  Physician Treatment Plan for Secondary Diagnosis: Principal Problem:   Suicidal ideation  I certify that inpatient services furnished can reasonably be expected to improve the patient's condition.   Ellouise JAYSON Azure, FNP 03/24/2024, 3:20 PM

## 2024-03-24 NOTE — Plan of Care (Signed)
   Problem: Education: Goal: Emotional status will improve Outcome: Progressing Goal: Mental status will improve Outcome: Progressing   Problem: Safety: Goal: Periods of time without injury will increase Outcome: Progressing

## 2024-03-24 NOTE — BH IP Treatment Plan (Signed)
 Interdisciplinary Treatment and Diagnostic Plan Update  03/24/2024 Time of Session: 11:25 AM Joyce [name] MRN: [id number]  Principal Diagnosis: Suicidal ideation  Secondary Diagnoses: Principal Problem:   Suicidal ideation   Current Medications:  Current Facility-Administered Medications  Medication Dose Route Frequency Provider Last Rate Last Admin   acetaminophen  (TYLENOL ) tablet 650 mg  650 mg Oral Q6H PRN Rollene Katz, MD       alum & mag hydroxide-simeth (MAALOX/MYLANTA) 200-200-20 MG/5ML suspension 30 mL  30 mL Oral Q4H PRN Rollene Katz, MD       ARIPiprazole  (ABILIFY ) tablet 2 mg  2 mg Oral Daily Ntuen, Tina C, FNP   2 mg at 03/24/24 1724   haloperidol  (HALDOL ) tablet 5 mg  5 mg Oral TID PRN Rollene Katz, MD       And   diphenhydrAMINE  (BENADRYL ) capsule 50 mg  50 mg Oral TID PRN Rollene Katz, MD       haloperidol  lactate (HALDOL ) injection 5 mg  5 mg Intramuscular TID PRN Rollene Katz, MD       And   diphenhydrAMINE  (BENADRYL ) injection 50 mg  50 mg Intramuscular TID PRN Rollene Katz, MD       And   LORazepam  (ATIVAN ) injection 2 mg  2 mg Intramuscular TID PRN Rollene Katz, MD       haloperidol  lactate (HALDOL ) injection 10 mg  10 mg Intramuscular TID PRN Rollene Katz, MD       And   diphenhydrAMINE  (BENADRYL ) injection 50 mg  50 mg Intramuscular TID PRN Rollene Katz, MD       And   LORazepam  (ATIVAN ) injection 2 mg  2 mg Intramuscular TID PRN Rollene Katz, MD       hydrOXYzine  (ATARAX ) tablet 25 mg  25 mg Oral TID PRN Rollene Katz, MD   25 mg at 03/24/24 0638   [START ON 03/25/2024] levothyroxine  (SYNTHROID ) tablet 100 mcg  100 mcg Oral Q0600 Ntuen, Tina C, FNP       magnesium  hydroxide (MILK OF MAGNESIA) suspension 30 mL  30 mL Oral Daily PRN Rollene Katz, MD       PTA Medications: Medications Prior to Admission  Medication Sig Dispense Refill Last Dose/Taking   albuterol (VENTOLIN HFA) 108 (90  Base) MCG/ACT inhaler Inhale 2 puffs into the lungs every 4 (four) hours as needed. (Patient not taking: Reported on 03/23/2024)      ARIPiprazole  (ABILIFY ) 10 MG tablet Take 1 tablet (10 mg total) by mouth daily. 30 tablet 3    Dextromethorphan -buPROPion  ER (AUVELITY ) 45-105 MG TBCR Take one tablet by mouth twice daily 8 hours between doses. (Patient taking differently: Take 1 tablet by mouth 2 (two) times daily.) 60 tablet 0    hydrOXYzine  (ATARAX ) 25 MG tablet Take 1 tablet (25 mg total) by mouth 3 (three) times daily as needed for anxiety. 30 tablet 0    ipratropium (ATROVENT) 0.06 % nasal spray Place 2 sprays into both nostrils 4 (four) times daily. (Patient not taking: Reported on 03/23/2024)      levothyroxine  (SYNTHROID ) 100 MCG tablet Take 100 mcg by mouth every morning.      traZODone  (DESYREL ) 100 MG tablet Take 1 tablet (100 mg total) by mouth at bedtime as needed for sleep. 30 tablet 0     Patient Stressors: Marital or family conflict    Patient Strengths: Careers information officer for treatment/growth   Treatment Modalities: Medication Management, Group therapy, Case management,  1 to  1 session with clinician, Psychoeducation, Recreational therapy.   Physician Treatment Plan for Primary Diagnosis: Suicidal ideation Long Term Goal(s): Improvement in symptoms so as ready for discharge   Short Term Goals: Ability to identify changes in lifestyle to reduce recurrence of condition will improve Ability to verbalize feelings will improve Ability to disclose and discuss suicidal ideas Ability to demonstrate self-control will improve Ability to identify and develop effective coping behaviors will improve Ability to maintain clinical measurements within normal limits will improve Compliance with prescribed medications will improve Ability to identify triggers associated with substance abuse/mental health issues will improve  Medication Management:  Evaluate patient's response, side effects, and tolerance of medication regimen.  Therapeutic Interventions: 1 to 1 sessions, Unit Group sessions and Medication administration.  Evaluation of Outcomes: Not Progressing  Physician Treatment Plan for Secondary Diagnosis: Principal Problem:   Suicidal ideation  Long Term Goal(s): Improvement in symptoms so as ready for discharge   Short Term Goals: Ability to identify changes in lifestyle to reduce recurrence of condition will improve Ability to verbalize feelings will improve Ability to disclose and discuss suicidal ideas Ability to demonstrate self-control will improve Ability to identify and develop effective coping behaviors will improve Ability to maintain clinical measurements within normal limits will improve Compliance with prescribed medications will improve Ability to identify triggers associated with substance abuse/mental health issues will improve     Medication Management: Evaluate patient's response, side effects, and tolerance of medication regimen.  Therapeutic Interventions: 1 to 1 sessions, Unit Group sessions and Medication administration.  Evaluation of Outcomes: Not Progressing   RN Treatment Plan for Primary Diagnosis: Suicidal ideation Long Term Goal(s): Knowledge of disease and therapeutic regimen to maintain health will improve  Short Term Goals: Ability to remain free from injury will improve, Ability to verbalize frustration and anger appropriately will improve, Ability to demonstrate self-control, Ability to participate in decision making will improve, Ability to verbalize feelings will improve, Ability to disclose and discuss suicidal ideas, Ability to identify and develop effective coping behaviors will improve, and Compliance with prescribed medications will improve  Medication Management: RN will administer medications as ordered by provider, will assess and evaluate patient's response and provide education  to patient for prescribed medication. RN will report any adverse and/or side effects to prescribing provider.  Therapeutic Interventions: 1 on 1 counseling sessions, Psychoeducation, Medication administration, Evaluate responses to treatment, Monitor vital signs and CBGs as ordered, Perform/monitor CIWA, COWS, AIMS and Fall Risk screenings as ordered, Perform wound care treatments as ordered.  Evaluation of Outcomes: Not Progressing   LCSW Treatment Plan for Primary Diagnosis: Suicidal ideation Long Term Goal(s): Safe transition to appropriate next level of care at discharge, Engage patient in therapeutic group addressing interpersonal concerns.  Short Term Goals: Engage patient in aftercare planning with referrals and resources, Increase social support, Increase ability to appropriately verbalize feelings, Increase emotional regulation, Facilitate acceptance of mental health diagnosis and concerns, Facilitate patient progression through stages of change regarding substance use diagnoses and concerns, Identify triggers associated with mental health/substance abuse issues, and Increase skills for wellness and recovery  Therapeutic Interventions: Assess for all discharge needs, 1 to 1 time with Social worker, Explore available resources and support systems, Assess for adequacy in community support network, Educate family and significant other(s) on suicide prevention, Complete Psychosocial Assessment, Interpersonal group therapy.  Evaluation of Outcomes: Not Progressing   Progress in Treatment: Attending groups: No. Participating in groups: No. Taking medication as prescribed: Yes. Toleration medication: Yes. Family/Significant  other contact made: consents are pending Patient understands diagnosis: Yes. Discussing patient identified problems/goals with staff: Yes. Medical problems stabilized or resolved: Yes. Denies suicidal/homicidal ideation: Yes. Issues/concerns per patient  self-inventory: No.  New problem(s) identified:  No  New Short Term/Long Term Goal(s):  Patient recently admitted. CSW will continue to follow and assess for appropriate referrals and possible discharge planning.   Patient Goals:  I want to get better.  I want to stop feeling so depressed all the time and having intrusive thoughts.    Discharge Plan or Barriers:  Patient recently admitted. CSW will continue to follow and assess for appropriate referrals and possible discharge planning.   Reason for Continuation of Hospitalization: Depression Medication stabilization Suicidal ideation  Estimated Length of Stay:  5 - 7 days  Last 3 [place] Suicide Severity Risk Score: Flowsheet Row [facility] 03/22/2024 in [facility] Ad[facility] (Discharged) [facility]  C-SSRS RISK CATEGORY High Risk Error: Q7 should not be populated when Q6 is No Low Risk    Last PHQ 2/9 Scores:    03/22/2024    5:34 PM 06/17/2023   10:57 AM 09/02/2021    4:48 AM  Depression screen PHQ 2/9  Decreased Interest 2 2 2   Down, Depressed, Hopeless 2 3 2   PHQ - 2 Score 4 5 4   Altered sleeping 2 2 2   Tired, decreased energy 2 2 2   Change in appetite 0 2 0  Feeling bad or failure about yourself  3 3 2   Trouble concentrating 1 3 2   Moving slowly or fidgety/restless 1 3 1   Suicidal thoughts 2 3 1   PHQ-9 Score 15 23 14   Difficult doing work/chores Very difficult  Extremely dIfficult    Scribe for Treatment Team: Jayvan Mcshan O Nekhi Liwanag, LCSWA 03/24/2024 5:42 PM

## 2024-03-25 DIAGNOSIS — R45851 Suicidal ideations: Secondary | ICD-10-CM | POA: Diagnosis not present

## 2024-03-25 MED ORDER — BUPROPION HCL ER (SR) 100 MG PO TB12
100.0000 mg | ORAL_TABLET | Freq: Two times a day (BID) | ORAL | Status: DC
  Administered 2024-03-25 – 2024-03-27 (×5): 100 mg via ORAL
  Filled 2024-03-25 (×5): qty 1

## 2024-03-25 NOTE — Plan of Care (Signed)

## 2024-03-25 NOTE — [facility] Group Notes (Signed)
 The focus of this group is to help patients establish daily goals to achieve during treatment and discuss how the patient can incorporate goal setting into their daily lives to aide in recovery.     Scale 1-10  7   Goals: To speak with social worker

## 2024-03-25 NOTE — [facility] Group Notes (Signed)
 [facility] Group Notes:  (Nursing/MHT/Case Management/Adjunct)  Date:  03/25/2024  Time:  9:15 PM  Type of Therapy:  Wrap up group  Participation Level:  Did Not Attend  Participation Quality:    Affect:    Cognitive:    Insight:    Engagement in Group:    Modes of Intervention:    Summary of Progress/Problems: Pt refused to attend group.  Grayce LITTIE Essex 03/25/2024, 9:15 PM

## 2024-03-25 NOTE — Progress Notes (Addendum)
 [facility] MD Progress Note  03/25/2024 4:16 PM Joyce [name]  MRN:  [id number]  Principal Problem: Suicidal ideation Diagnosis: Principal Problem:   Suicidal ideation   Reason for admission:  Joyce [name] is a 23 year old African-American female with prior psychiatric diagnoses significant for major depressive disorder recurrent, severe without psychotic features, generalized anxiety disorder, and suicidal ideation who presents voluntarily to [facility] from [facility] behavioral health urgent care for worsening depression resulting in suicidal ideation with plans to 'find a knife and slit her throat' in the context of family altercation and recent break-up with boyfriend.  UDS: No substances detected.   24-hour chart review: Patient's case discussed in the interdisciplinary team meeting.  Vital signs without critical values.  Agitation protocol required x 1.  As needed of hydroxyzine  x 1 required for anxiety.  Today's assessment notes: On assessment today, the pt reports that her mood is less depressed and rates depression as #3/10, with 10 being high severity.  Chart reviewed and findings shared with the treatment team and consult with attending psychiatrist, with recommendation to replace  Auvelity  with Wellbutrin  SR 12-hour tablet 100 mg p.o. twice daily for depression and anxiety.  Patient in agreement with the recommendation.  Speech clear, coherent with normal volume and pattern.  Mood remained depressed with flat affect.  No preoccupation of responding to internal stimuli.  Observed the patient with slight smiles when asked for her goal today.  Reports my goal is to attend therapeutic milieu and participate in unit group activities.  Nursing staff report patient compliant with her psychotropic medications without complaint of adverse reaction.  Endorses communicating with her parents over the phone, however, no visitation last night.  She denies SI, HI, or  AVH Reports that anxiety is at manageable level. Sleep is improving Appetite is okay Concentration is better Energy level is adequate   Denies having side effects to current psychiatric medications.   We discussed changes to current medication regimen, including replacing Auvelity  with BuPROPion  ER (WELLBUTRIN  SR) 12 hr tablet 100 mg p.o. twice daily for depression and anxiety.  Patient is in agreement with medication adjustment.  Total Time spent with patient: 45 minutes  Past Psychiatric History: Previous Psych Diagnoses: MDD, GAD, SI, Prior inpatient treatment: Yes x 2 at [facility]; in December 2022 for MDD recurrent severe, and October 2024 for GAD Current/prior outpatient treatment: Yes, receiving therapy at [facility]. Prior rehab hx: Denies Psychotherapy hx: Yes History of suicide: History of suicide attempt x 2 History of homicide or aggression: Denies Psychiatric medication history: Patient has been on trial Auvelity , Abilify , trazodone  and hydroxyzine  Psychiatric medication compliance history: Compliance Neuromodulation history: Denies. Current Psychiatrist: Yes, Dr. Redell Silvan Current therapist: Yes, Rojelio Dad  Past Medical History:  Past Medical History:  Diagnosis Date   Constipation    Hypopituitarism (HCC)    Left foot drop    Pre-diabetes    Thyroid  disease     Past Surgical History:  Procedure Laterality Date   CHOLECYSTECTOMY N/A 01/14/2022   Procedure: LAPAROSCOPIC CHOLECYSTECTOMY;  Surgeon: Stechschulte, Deward PARAS, MD;  Location: [facility] OR;  Service: General;  Laterality: N/A;   Family History:  Family History  Problem Relation Age of Onset   Anxiety disorder Mother    Depression Mother    Hypertension Mother    Depression Father    Healthy Father    Drug abuse Brother    Hypertension Paternal Grandmother    Diabetes Paternal Grandmother  Family Psychiatric  History: See H&P Social History:  Social History   Substance and Sexual Activity   Alcohol Use Not Currently     Social History   Substance and Sexual Activity  Drug Use Never    Social History   Socioeconomic History   Marital status: Single    Spouse name: Not on file   Number of children: 0   Years of education: college student now   [employer] education level: Not on file  Occupational History   Occupation: Consulting civil engineer    Comment: Works at [employer]  Tobacco Use   Smoking status: Never   Smokeless tobacco: Never  Vaping Use   Vaping status: Never Used  Substance and Sexual Activity   Alcohol use: Not Currently   Drug use: Never   Sexual activity: Never  Other Topics Concern   Not on file  Social History Narrative   Lives at home with parents.   Right-handed.   No daily caffeine use.   Loves to draw, and write stories.    Social Drivers of Corporate investment banker Strain: Not on file  Food Insecurity: No Food Insecurity (03/23/2024)   Hunger Vital Sign    Worried About Running Out of Food in the Last Year: Never true    Ran Out of Food in the Last Year: Never true  Transportation Needs: No Transportation Needs (03/23/2024)   PRAPARE - Administrator, Civil Service (Medical): No    Lack of Transportation (Non-Medical): No  Physical Activity: Not on file  Stress: Not on file  Social Connections: Not on file   Additional Social History:    Sleep: Good Estimated Sleeping Duration (Last 24 Hours): 5.75-6.50 hours  Appetite:  Good  Current Medications: Current Facility-Administered Medications  Medication Dose Route Frequency Provider Last Rate Last Admin   acetaminophen  (TYLENOL ) tablet 650 mg  650 mg Oral Q6H PRN Rollene Katz, MD       alum & mag hydroxide-simeth (MAALOX/MYLANTA) 200-200-20 MG/5ML suspension 30 mL  30 mL Oral Q4H PRN Rollene Katz, MD       ARIPiprazole  (ABILIFY ) tablet 2 mg  2 mg Oral Daily Cashus Halterman C, FNP   2 mg at 03/25/24 0850   buPROPion  ER (WELLBUTRIN  SR) 12 hr tablet 100 mg  100 mg Oral  BID Parker, Alvin S, MD   100 mg at 03/25/24 1111   haloperidol  (HALDOL ) tablet 5 mg  5 mg Oral TID PRN Rollene Katz, MD   5 mg at 03/24/24 2156   And   diphenhydrAMINE  (BENADRYL ) capsule 50 mg  50 mg Oral TID PRN Rollene Katz, MD   50 mg at 03/24/24 2156   haloperidol  lactate (HALDOL ) injection 5 mg  5 mg Intramuscular TID PRN Rollene Katz, MD       And   diphenhydrAMINE  (BENADRYL ) injection 50 mg  50 mg Intramuscular TID PRN Rollene Katz, MD       And   LORazepam  (ATIVAN ) injection 2 mg  2 mg Intramuscular TID PRN Rollene Katz, MD       haloperidol  lactate (HALDOL ) injection 10 mg  10 mg Intramuscular TID PRN Rollene Katz, MD       And   diphenhydrAMINE  (BENADRYL ) injection 50 mg  50 mg Intramuscular TID PRN Rollene Katz, MD       And   LORazepam  (ATIVAN ) injection 2 mg  2 mg Intramuscular TID PRN Rollene Katz, MD       hydrOXYzine  (ATARAX ) tablet  25 mg  25 mg Oral TID PRN Rollene Katz, MD   25 mg at 03/24/24 2108   levothyroxine  (SYNTHROID ) tablet 100 mcg  100 mcg Oral Q0600 Darlena Koval C, FNP   100 mcg at 03/25/24 9376   magnesium  hydroxide (MILK OF MAGNESIA) suspension 30 mL  30 mL Oral Daily PRN Rollene Katz, MD       Lab Results:  Results for orders placed or performed during the hospital encounter of 03/23/24 (from the past 48 hours)  Vitamin B12     Status: None   Collection Time: 03/23/24  6:53 PM  Result Value Ref Range   Vitamin B-12 415 180 - 914 pg/mL    Comment: (NOTE) This assay is not validated for testing neonatal or myeloproliferative syndrome specimens for Vitamin B12 levels. Performed at [facility], [address], [place], [place] [address]   VITAMIN D  25 Hydroxy (Vit-D Deficiency, Fractures)     Status: None   Collection Time: 03/23/24  6:53 PM  Result Value Ref Range   Vit D, 25-Hydroxy 38.09 30 - 100 ng/mL    Comment: (NOTE) Vitamin D  deficiency has been defined by the  Institute of Medicine  and an Endocrine Society practice guideline as a level of serum 25-OH  vitamin D  less than 20 ng/mL (1,2). The Endocrine Society went on to  further define vitamin D  insufficiency as a level between 21 and 29  ng/mL (2).  1. IOM (Institute of Medicine). 2010. Dietary reference intakes for  calcium and D. [place]: [employer]. 2. Holick MF, Binkley [place], Bischoff-Ferrari HA, et al. Evaluation,  treatment, and prevention of vitamin D  deficiency: an Endocrine  Society clinical practice guideline, JCEM. 2011 Jul; 96(7): 1911-30.  Performed at [facility], [address] [place], [place] [address]   Folate     Status: None   Collection Time: 03/23/24  6:53 PM  Result Value Ref Range   Folate 13.5 >5.9 ng/mL    Comment: Performed at [facility], [address] [place], [place] [address]   Blood Alcohol level:  Lab Results  Component Value Date   [facility] <15 03/22/2024   Metabolic Disorder Labs: Lab Results  Component Value Date   HGBA1C 5.7 (H) 03/22/2024   MPG 117 03/22/2024   MPG 114.02 06/17/2023   No results found for: PROLACTIN Lab Results  Component Value Date   CHOL 113 03/22/2024   TRIG 65 03/22/2024   HDL 53 03/22/2024   CHOLHDL 2.1 03/22/2024   VLDL 13 03/22/2024   LDLCALC 47 03/22/2024   LDLCALC 47 06/19/2023   Physical Findings: AIMS:  ,  ,  ,  ,  ,  ,   CIWA:    COWS:     Musculoskeletal: Strength & Muscle Tone: within normal limits Gait & Station: normal Patient leans: N/A  Psychiatric Specialty Exam:  Presentation  General Appearance:  Appropriate for Environment; Casual  Eye Contact: Fair  Speech: Clear and Coherent  Speech Volume: Normal  Handedness: Right  Mood and Affect  Mood: Anxious; Worthless; Depressed  Affect: Congruent  Thought Process  Thought Processes: Coherent; Linear  Descriptions of Associations:Intact  Orientation:Full (Time, Place and  Person)  Thought Content:Logical  History of Schizophrenia/Schizoaffective disorder:No  Duration of Psychotic Symptoms: None  Hallucinations:Hallucinations: None  Ideas of Reference:None  Suicidal Thoughts:Suicidal Thoughts: No SI Active Intent and/or Plan: -- (denies)  Homicidal Thoughts:Homicidal Thoughts: No  Sensorium  Memory: Immediate Good; Recent  Good  Judgment: Fair  Insight: Fair  Chartered certified accountant: Good  Attention Span: Good  Recall: Chong Dotti Abe of Knowledge: Fair  Language: Good  Psychomotor Activity  Psychomotor Activity: Psychomotor Activity: Normal  Assets  Assets: Communication Skills; Desire for Improvement; Housing; Physical Health; Resilience; Social Support  Sleep  Sleep: Sleep: Good Number of Hours of Sleep: 7  Physical Exam: Physical Exam Vitals and nursing note reviewed.  Constitutional:      General: She is not in acute distress.    Appearance: She is not ill-appearing.  HENT:     Head: Normocephalic.     Right Ear: External ear normal.     Left Ear: External ear normal.     Nose: Nose normal.     Mouth/Throat:     Mouth: Mucous membranes are moist.     Pharynx: Oropharynx is clear.  Eyes:     Extraocular Movements: Extraocular movements intact.  Cardiovascular:     Rate and Rhythm: Normal rate.     Pulses: Normal pulses.  Pulmonary:     Effort: Pulmonary effort is normal.  Abdominal:     Comments: Deferred   Genitourinary:    Comments: Deferred  Musculoskeletal:        General: Normal range of motion.     Cervical back: Normal range of motion.  Skin:    General: Skin is warm.  Neurological:     General: No focal deficit present.     Mental Status: She is alert and oriented to person, place, and time.  Psychiatric:        Mood and Affect: Mood normal.        Behavior: Behavior normal.        Thought Content: Thought content normal.    Review of Systems  Constitutional:   Negative for chills and fever.  HENT:  Negative for sore throat.   Eyes:  Negative for blurred vision.  Respiratory:  Negative for cough, sputum production, shortness of breath and wheezing.   Cardiovascular:  Negative for chest pain and palpitations.  Gastrointestinal:  Negative for abdominal pain, constipation, diarrhea, heartburn, nausea and vomiting.  Genitourinary:  Negative for dysuria, frequency and urgency.  Musculoskeletal:  Negative for falls.  Skin:  Negative for itching and rash.  Neurological:  Negative for dizziness and headaches.  Endo/Heme/Allergies:        See allergy listing  Psychiatric/Behavioral:  Positive for depression. Negative for hallucinations, substance abuse and suicidal ideas. The patient is nervous/anxious. The patient does not have insomnia.    Blood pressure 92/63, pulse 73, temperature 98.2 F (36.8 C), temperature source Oral, resp. rate 18, height 5' 9 (1.753 m), weight 101.6 kg, SpO2 100%. Body mass index is 33.08 kg/m.  Treatment Plan Summary: Daily contact with patient to assess and evaluate symptoms and progress in treatment and Medication management Treatment Plan Summary: Daily contact with patient to assess and evaluate symptoms and progress in treatment and Medication management   Physician Treatment Plan for Primary Diagnosis:  Assessment:  Joyce [name] is a 23 year old African-American female with prior psychiatric diagnoses significant for major depressive disorder recurrent, severe without psychotic features, generalized anxiety disorder, and suicidal ideation who presents voluntarily to [facility] from [facility] behavioral health urgent care for worsening depression resulting in suicidal ideation with plans to 'find a knife and slit her throat' in the context of family altercation and recent break-up with boyfriend.  UDS: No substances detected.  Suicidal ideation GAD MDD recurrent severe without  psychotic features   Plans: Medications: Resume home medications --Auvelity  45-105 MG TBCR  replaced with BuPROPion  ER (WELLBUTRIN  SR) 12 hr tablet 100 mg p.o. twice daily for depression and anxiety 03/26/2023 --Continue Abilify  2 mg p.o. daily to augment antidepressant --Continue hydroxyzine  tablet 25 mg p.o. 3 times daily as needed for anxiety --Continue trazodone  tablet 50 mg p.o. q. nightly as needed for sleep   Medication for other medical problems: -- Continue Synthroid  tablet 100 mcg p.o. daily for hypothyroidism   Other PRN Medications  -Acetaminophen  650 mg every 6 as needed/mild pain  -Maalox 30 mL oral every 4 as needed/digestion  -Magnesium  hydroxide 30 mL daily as needed/mild constipation    --The risks/benefits/side-effects/alternatives to this medication were discussed in detail with the patient and time was given for questions. --The patient consents to medication trial.   -- Metabolic profile and EKG monitoring obtained while on an atypical antipsychotic (BMI: Lipid Panel: HbgA1c: QTc:)   -- Encouraged patient to participate in unit milieu and in scheduled group therapies    Continue BH Agitation Protocol  --Haldol  5 mg, oral, 3 times daily as needed, mild agitation  --Benadryl  50 mg, oral, 3 times daily as needed, mild agitation                                      OR   --Haldol  injection 5 mg, IM, 3 times daily as needed, moderate agitation  --Benadryl  injection 50 mg, IM, 3 times daily as needed, moderate agitation  --Ativan  injection 2 mg, IM, 3 times daily as needed, moderate agitation                                      OR  --Haldol  injection 10 mg, IM, 3 times daily as needed, severe agitation  --Benadryl  injection 50 mg, IM, 3 times daily as needed, severe agitation  --Ativan  injection 2 mg, IM, 3 times daily as needed, severe agitation    Admission labs reviewed: CMP: Within normal limits.  Lipid profile: Within normal limits.  CBC with differentials: WBC  11.5 elevated, lymphs 4.2 elevated, otherwise normal.  Hemoglobin A1c: 5.7 slight elevation.  TSH: 1.472 normal.  RPR: Reactive.  Urine pregnancy: Negative.  UDS: No substances detected.  Folate: 13.5 normal.  Vitamin D  25-hydroxy: 38.09 normal.  Vitamin B12: 415, normal.   New labs ordered: None   EKG reviewed: Normal sinus rhythm, ventricular rate 66, QT/QTc 402/421.   Safety and Monitoring:  Voluntary admission to inpatient psychiatric unit for safety, stabilization and treatment  Daily contact with patient to assess and evaluate symptoms and progress in treatment  Patient's case to be discussed in multi-disciplinary team meeting  Observation Level : q15 minute checks  Vital signs: q12 hours  Precautions: suicide, but pt currently verbally contracts for safety on unit?    Discharge Planning:  Social work and case management to assist with discharge planning and identification of hospital follow-up needs prior to discharge  Estimated LOS: 5-7?days  Discharge Concerns: Need to establish a safety plan; Medication compliance and effectiveness  Discharge Goals: Return home with outpatient referrals for mental health follow-up including medication management/psychotherapy.    Long Term Goal(s): Improvement in symptoms so as ready for discharge   Short Term Goals: Ability to identify changes  in lifestyle to reduce recurrence of condition will improve, Ability to verbalize feelings will improve, Ability to disclose and discuss suicidal ideas, Ability to demonstrate self-control will improve, Ability to identify and develop effective coping behaviors will improve, Ability to maintain clinical measurements within normal limits will improve, Compliance with prescribed medications will improve, and Ability to identify triggers associated with substance abuse/mental health issues will improve   Physician Treatment Plan for Secondary Diagnosis: Principal Problem:   Suicidal ideation Ellouise JAYSON Azure,  FNP 03/25/2024, 4:16 PM

## 2024-03-25 NOTE — Progress Notes (Signed)
   03/25/24 2201  Psych Admission Type (Psych Patients Only)  Admission Status Voluntary  Psychosocial Assessment  Patient Complaints Depression  Eye Contact Fair  Facial Expression Flat  Affect Appropriate to circumstance  Speech Logical/coherent  Interaction Guarded  Motor Activity Slow  Appearance/Hygiene Unremarkable  Behavior Characteristics Appropriate to situation  Mood Depressed  Thought Process  Coherency WDL  Content WDL  Delusions None reported or observed  Perception WDL  Hallucination None reported or observed  Judgment Limited  Confusion None  Danger to Self  Current suicidal ideation? Denies  Self-Injurious Behavior No self-injurious ideation or behavior indicators observed or expressed   Agreement Not to Harm Self Yes  Description of Agreement verbal  Danger to Others  Danger to Others None reported or observed

## 2024-03-25 NOTE — Progress Notes (Signed)
 Pt very emotional , labile about wanting, pt appearing to be a little agitated, pt given PRN agitation protocol per [facility] @ 2156 03/24/24

## 2024-03-25 NOTE — [facility] Suicide Risk Assessment (Signed)
 [facility] INPATIENT:  Family/Significant Other Suicide Prevention Education  Suicide Prevention Education:  Education Completed; with patient's father Joyce [name] [phone]) who has been identified by the patient as the family member with whom the patient will be residing, and identified as the person(s) who will aid the patient in the event of a mental health crisis (suicidal ideations/suicide attempt).  With written consent from the patient, the family member/significant other has been provided the following suicide prevention education, prior to the and/or following the discharge of the patient.  The suicide prevention education provided includes the following: Suicide risk factors Suicide prevention and interventions National Suicide Hotline telephone number [facility] assessment telephone number [facility] Emergency Assistance 911 [facility] and/or Residential Mobile Crisis Unit telephone number  Request made of family/significant other to: Remove weapons (e.g., guns, rifles, knives), all items previously/currently identified as safety concern.   Remove drugs/medications (over-the-counter, prescriptions, illicit drugs), all items previously/currently identified as a safety concern.  CSW spoke with patient's father Joyce [name] who reports firearms in the home are stored with locked cases and medications are monitored with pill boxes.  The patients father states understanding of the suicide prevention education information provided. The family member agrees to remove the items of safety concern listed above.  Joyce [name] 03/25/2024, 4:45 PM
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
## 2024-03-25 NOTE — Progress Notes (Signed)
   03/25/24 0900  Psych Admission Type (Psych Patients Only)  Admission Status Voluntary  Psychosocial Assessment  Patient Complaints Depression  Eye Contact Fair  Facial Expression Flat  Affect Appropriate to circumstance  Speech Logical/coherent  Interaction Cautious;Guarded;Childlike  Motor Activity Slow  Appearance/Hygiene Unremarkable  Behavior Characteristics Cooperative  Mood Depressed  Thought Process  Coherency WDL  Content WDL  Delusions None reported or observed  Perception WDL  Hallucination None reported or observed  Judgment Limited  Confusion None  Danger to Self  Current suicidal ideation? Denies  Self-Injurious Behavior No self-injurious ideation or behavior indicators observed or expressed   Agreement Not to Harm Self Yes  Description of Agreement verbal  Danger to Others  Danger to Others None reported or observed

## 2024-03-25 NOTE — BHH Group Notes (Signed)
 LCSW Wellness Group Note   03/25/2024 2:00pm  Type of Group and Topic: Psychoeducational Group:  Wellness  Participation Level:  did not attend  Description of Group  Wellness group introduces the topic and its focus on developing healthy habits across the spectrum and its relationship to a decrease in hospital admissions.  Six areas of wellness are discussed: physical, social spiritual, intellectual, occupational, and emotional.  Patients are asked to consider their current wellness habits and to identify areas of wellness where they are interested and able to focus on improvements.    Therapeutic Goals Patients will understand components of wellness and how they can positively impact overall health.  Patients will identify areas of wellness where they have developed good habits. Patients will identify areas of wellness where they would like to make improvements.    Summary of Patient Progress     Therapeutic Modalities: Cognitive Behavioral Therapy Psychoeducation    Bridget Cordella Simmonds, LCSW

## 2024-03-26 DIAGNOSIS — R45851 Suicidal ideations: Secondary | ICD-10-CM | POA: Diagnosis not present

## 2024-03-26 NOTE — Plan of Care (Signed)
  Problem: Education: Goal: Mental status will improve Outcome: Progressing   Problem: Activity: Goal: Interest or engagement in activities will improve Outcome: Not Progressing   

## 2024-03-26 NOTE — Progress Notes (Signed)
 [facility] MD Progress Note  03/26/2024 10:52 AM Joyce [name]  MRN:  [id number]  Principal Problem: Suicidal ideation Diagnosis: Principal Problem:   Suicidal ideation  Patient is a  23y.o. female with psych history of depressive disorder, who presents to the [facility] unit due to worsening depression resulting in suicidal ideation with plans to 'find a knife and slit her throat' in the context of family altercation and recent break-up with boyfriend.   Interval History Patient was seen today for re-evaluation.  Nursing reports no events overnight. The patient has no issues with performing ADLs.  Patient has been medication compliant.    Patient was seen and interviewed by attending psychiatrist. Chart reviewed. Patient discussed during treatment team rounds.  Subjective:  On assessment, patient reports, I am okay, and states that her depression is gradually improving, describing her mood as more stable compared to previous days. She denies any suicidal ideations, plans, or intent at this time. The patient reports that she is sleeping well through the night and has maintained a good appetite. She denies experiencing any symptoms of psychosis, including auditory or visual hallucinations, paranoia, or feelings of being unsafe. No delusional thoughts were expressed during the interview. She also denies any thoughts, urges, or plans to harm others. The patient reports that she is tolerating her current medications without any side effects.  The only physical complaint reported today is occasional abdominal cramping, which the patient attributes to the onset of her menstrual period. No other somatic concerns or new medical symptoms were noted at this time.  Labs: no new results for review.  Total Time spent with patient: 30 minutes  Past Psychiatric History: see H&P  Past Medical History:  Past Medical History:  Diagnosis Date   Constipation    Hypopituitarism (HCC)    Left foot drop    Pre-diabetes     Thyroid  disease     Past Surgical History:  Procedure Laterality Date   CHOLECYSTECTOMY N/A 01/14/2022   Procedure: LAPAROSCOPIC CHOLECYSTECTOMY;  Surgeon: Stechschulte, Deward PARAS, MD;  Location: [facility] OR;  Service: General;  Laterality: N/A;   Family History:  Family History  Problem Relation Age of Onset   Anxiety disorder Mother    Depression Mother    Hypertension Mother    Depression Father    Healthy Father    Drug abuse Brother    Hypertension Paternal Grandmother    Diabetes Paternal Grandmother    Family Psychiatric  History: see H&P Social History:  Social History   Substance and Sexual Activity  Alcohol Use Not Currently     Social History   Substance and Sexual Activity  Drug Use Never    Social History   Socioeconomic History   Marital status: Single    Spouse name: Not on file   Number of children: 0   Years of education: college student now   [employer] education level: Not on file  Occupational History   Occupation: Consulting civil engineer    Comment: Works at [employer]  Tobacco Use   Smoking status: Never   Smokeless tobacco: Never  Vaping Use   Vaping status: Never Used  Substance and Sexual Activity   Alcohol use: Not Currently   Drug use: Never   Sexual activity: Never  Other Topics Concern   Not on file  Social History Narrative   Lives at home with parents.   Right-handed.   No daily caffeine use.   Loves to draw, and write stories.    Social Drivers  of Health   Financial Resource Strain: Not on file  Food Insecurity: No Food Insecurity (03/23/2024)   Hunger Vital Sign    Worried About Running Out of Food in the Last Year: Never true    Ran Out of Food in the Last Year: Never true  Transportation Needs: No Transportation Needs (03/23/2024)   PRAPARE - Administrator, Civil Service (Medical): No    Lack of Transportation (Non-Medical): No  Physical Activity: Not on file  Stress: Not on file  Social Connections: Not on file    Additional Social History:                         Sleep: Fair Estimated Sleeping Duration (Last 24 Hours): 7.25-9.75 hours  Appetite:  Fair  Current Medications: Current Facility-Administered Medications  Medication Dose Route Frequency Provider Last Rate Last Admin   acetaminophen  (TYLENOL ) tablet 650 mg  650 mg Oral Q6H PRN Rollene Katz, MD   650 mg at 03/26/24 0753   alum & mag hydroxide-simeth (MAALOX/MYLANTA) 200-200-20 MG/5ML suspension 30 mL  30 mL Oral Q4H PRN Rollene Katz, MD       ARIPiprazole  (ABILIFY ) tablet 2 mg  2 mg Oral Daily Ntuen, Tina C, FNP   2 mg at 03/26/24 0755   buPROPion  ER (WELLBUTRIN  SR) 12 hr tablet 100 mg  100 mg Oral BID Parker, Alvin S, MD   100 mg at 03/26/24 0754   haloperidol  (HALDOL ) tablet 5 mg  5 mg Oral TID PRN Rollene Katz, MD   5 mg at 03/24/24 2156   And   diphenhydrAMINE  (BENADRYL ) capsule 50 mg  50 mg Oral TID PRN Rollene Katz, MD   50 mg at 03/24/24 2156   haloperidol  lactate (HALDOL ) injection 5 mg  5 mg Intramuscular TID PRN Rollene Katz, MD       And   diphenhydrAMINE  (BENADRYL ) injection 50 mg  50 mg Intramuscular TID PRN Rollene Katz, MD       And   LORazepam  (ATIVAN ) injection 2 mg  2 mg Intramuscular TID PRN Rollene Katz, MD       haloperidol  lactate (HALDOL ) injection 10 mg  10 mg Intramuscular TID PRN Rollene Katz, MD       And   diphenhydrAMINE  (BENADRYL ) injection 50 mg  50 mg Intramuscular TID PRN Rollene Katz, MD       And   LORazepam  (ATIVAN ) injection 2 mg  2 mg Intramuscular TID PRN Rollene Katz, MD       hydrOXYzine  (ATARAX ) tablet 25 mg  25 mg Oral TID PRN Rollene Katz, MD   25 mg at 03/26/24 0753   levothyroxine  (SYNTHROID ) tablet 100 mcg  100 mcg Oral Q0600 Ntuen, Tina C, FNP   100 mcg at 03/26/24 0615   magnesium  hydroxide (MILK OF MAGNESIA) suspension 30 mL  30 mL Oral Daily PRN Rollene Katz, MD        Lab Results: No results found  for this or any previous visit (from the past 48 hours).  Blood Alcohol level:  Lab Results  Component Value Date   [facility] <15 03/22/2024    Metabolic Disorder Labs: Lab Results  Component Value Date   HGBA1C 5.7 (H) 03/22/2024   MPG 117 03/22/2024   MPG 114.02 06/17/2023   No results found for: PROLACTIN Lab Results  Component Value Date   CHOL 113 03/22/2024   TRIG 65 03/22/2024   HDL 53 03/22/2024   CHOLHDL  2.1 03/22/2024   VLDL 13 03/22/2024   LDLCALC 47 03/22/2024   LDLCALC 47 06/19/2023    Physical Findings: AIMS:  ,  ,  ,  ,  ,  ,   CIWA:    COWS:     Musculoskeletal: Strength & Muscle Tone: within normal limits Gait & Station: normal Patient leans: N/A  Psychiatric Specialty Exam:  Presentation  General Appearance:  Appropriate for Environment; Casual  Eye Contact: Fair  Speech: Clear and Coherent  Speech Volume: Normal  Handedness: Right   Mood and Affect  Mood: Anxious; Worthless; Depressed  Affect: Congruent   Thought Process  Thought Processes: Coherent; Linear  Descriptions of Associations:Intact  Orientation:Full (Time, Place and Person)  Thought Content:Logical  History of Schizophrenia/Schizoaffective disorder:No  Duration of Psychotic Symptoms:No data recorded Hallucinations:Hallucinations: None  Ideas of Reference:None  Suicidal Thoughts:Suicidal Thoughts: No SI Active Intent and/or Plan: -- (denies)  Homicidal Thoughts:Homicidal Thoughts: No   Sensorium  Memory: Immediate Good; Recent Good  Judgment: Fair  Insight: Fair   Art therapist  Concentration: Good  Attention Span: Good  Recall: Chong Dotti Abe of Knowledge: Fair  Language: Good   Psychomotor Activity  Psychomotor Activity: Psychomotor Activity: Normal   Assets  Assets: Communication Skills; Desire for Improvement; Housing; Physical Health; Resilience; Social Support   Sleep  Sleep: Sleep: Good Number of  Hours of Sleep: 7    Physical Exam: Physical Exam ROS Blood pressure 115/72, pulse 62, temperature 98 F (36.7 C), temperature source Oral, resp. rate 18, height 5' 9 (1.753 m), weight 101.6 kg, SpO2 100%. Body mass index is 33.08 kg/m.   Treatment Plan Summary: Daily contact with patient to assess and evaluate symptoms and progress in treatment and Medication management  Patient is a 23 year old female with the above-stated past psychiatric history who is seen in follow-up.  Chart reviewed. Patient discussed with nursing. Overall, the patient appears to be progressing in her treatment, with improved mood, stable behavior, and no acute psychiatric or medical concerns noted during today's evaluation.   Diagnoses/ Active problems:  MDD recurrent severe without psychotic features     PLAN:  Safety and Monitoring: continue inpatient psych admission; 15-minute checks; daily contact with patient to assess and evaluate symptoms and progress in treatment; psychoeducation.Vital signs: q12 hours. Precautions: suicide, elopement, and assault. Placed on room lock out for meals, snacks and groups.  Medications: --Continue Abilify  2 mg p.o. daily to augment antidepressant --continue Bupropion  ER 100mg  PO BID for depression --Continue hydroxyzine  tablet 25 mg p.o. 3 times daily as needed for anxiety --Continue trazodone  tablet 50 mg p.o. q. nightly as needed for sleep   PRN medications: acetaminophen , alum & mag hydroxide-simeth, haloperidol  **AND** diphenhydrAMINE , haloperidol  lactate **AND** diphenhydrAMINE  **AND** LORazepam , haloperidol  lactate **AND** diphenhydrAMINE  **AND** LORazepam , hydrOXYzine , magnesium  hydroxide   -- Encouraged patient to participate in unit milieu and in scheduled group therapies    Discharge Planning: -Social work and case management to assist with discharge planning and identification of hospital follow-up needs prior to discharge -Estimated LOS: 3-5  days -Discharge Concerns: Need to establish a safety plan; Medication compliance and effectiveness -Discharge Goals: Return home with outpatient referrals for mental health follow-up including medication management/psychotherapy  Total Time Spent in Direct Patient Care:  I personally spent 35 minutes on the unit in direct patient care. The direct patient care time included face-to-face time with the patient, reviewing the patient's chart, communicating with other professionals, and coordinating care. Greater than 50% of this time was  spent in counseling or coordinating care with the patient regarding goals of hospitalization, psycho-education, and discharge planning needs.   Neil Appl, MD 03/26/2024, 10:53 AM

## 2024-03-26 NOTE — Plan of Care (Signed)

## 2024-03-26 NOTE — Progress Notes (Signed)
   03/26/24 2148  Psych Admission Type (Psych Patients Only)  Admission Status Voluntary  Psychosocial Assessment  Patient Complaints Depression  Eye Contact Fair  Facial Expression Flat  Affect Appropriate to circumstance  Speech Logical/coherent  Interaction Guarded  Motor Activity Slow  Appearance/Hygiene Unremarkable  Behavior Characteristics Appropriate to situation  Mood Depressed  Thought Process  Coherency WDL  Content WDL  Delusions None reported or observed  Perception WDL  Hallucination None reported or observed  Judgment Limited  Confusion None  Danger to Self  Current suicidal ideation? Denies  Self-Injurious Behavior No self-injurious ideation or behavior indicators observed or expressed   Agreement Not to Harm Self Yes  Description of Agreement verbal  Danger to Others  Danger to Others None reported or observed

## 2024-03-26 NOTE — Progress Notes (Signed)
 Adult Psychoeducational Group Note  Date:  03/26/2024 Time:  10:16 PM  Group Topic/Focus:  Wrap-Up Group:   The focus of this group is to help patients review their daily goal of treatment and discuss progress on daily workbooks.  Participation Level:  Active  Participation Quality:  Appropriate  Affect:  Appropriate  Cognitive:  Appropriate  Insight: Appropriate  Engagement in Group:  Engaged  Modes of Intervention:  Discussion  Additional Comments:  Cheyann said the one positive take away she does a lot writing drawing. This helps her emotions. She has found this to help her cope.  Lang Drilling Long 03/26/2024, 10:16 PM

## 2024-03-26 NOTE — Progress Notes (Signed)
 D: Patient is alert, oriented, depressed, and cooperative. Denies SI, HI, AVH, and verbally contracts for safety. Patient reports she slept good last night without sleeping medication. Patient reports her appetite as good, energy level as normal, and concentration as good. Patient rates her depression 4/10, hopelessness 4/10, and anxiety 3/10. Patient reports menstrual cramps/stomach ache.    A: Scheduled medications administered per MD order. PRN tylenol  and hydroxyzine  administered. Support provided. Patient educated on safety on the unit and medications. Routine safety checks every 15 minutes. Patient stated understanding to tell nurse about any new physical symptoms. Patient understands to tell staff of any needs.     R: No adverse drug reactions noted. Patient remains safe at this time and will continue to monitor.    03/26/24 0900  Psych Admission Type (Psych Patients Only)  Admission Status Voluntary  Psychosocial Assessment  Patient Complaints Depression  Eye Contact Fair  Facial Expression Flat  Affect Appropriate to circumstance  Speech Logical/coherent  Interaction Guarded  Motor Activity Slow  Appearance/Hygiene Unremarkable  Behavior Characteristics Cooperative;Appropriate to situation  Mood Depressed  Thought Process  Coherency WDL  Content WDL  Delusions None reported or observed  Perception WDL  Hallucination None reported or observed  Judgment Limited  Confusion None  Danger to Self  Current suicidal ideation? Denies  Self-Injurious Behavior No self-injurious ideation or behavior indicators observed or expressed   Agreement Not to Harm Self Yes  Description of Agreement verbal  Danger to Others  Danger to Others None reported or observed

## 2024-03-26 NOTE — Progress Notes (Signed)
   03/26/24 0528  15 Minute Checks  Location Bedroom  Visual Appearance Calm  Behavior Sleeping  Sleep (Behavioral Health Patients Only)  Calculate sleep? (Click Yes once per 24 hr at 0600 safety check) Yes  Documented sleep last 24 hours 10

## 2024-03-27 DIAGNOSIS — R45851 Suicidal ideations: Secondary | ICD-10-CM | POA: Diagnosis not present

## 2024-03-27 MED ORDER — BUPROPION HCL ER (XL) 300 MG PO TB24
300.0000 mg | ORAL_TABLET | Freq: Every day | ORAL | Status: DC
  Administered 2024-03-28 – 2024-03-29 (×2): 300 mg via ORAL
  Filled 2024-03-27 (×2): qty 1

## 2024-03-27 MED ORDER — BUPROPION HCL ER (SR) 150 MG PO TB12: 150.0000 mg | ORAL_TABLET | Freq: Two times a day (BID) | ORAL | Status: DC

## 2024-03-27 NOTE — [facility] Group Notes (Signed)
 Spiritual care group on grief and loss facilitated by Chaplain Rockie Sofia, [facility]  Group Goal: Support / Education around grief and loss  Members engage in facilitated group support and psycho-social education.  Group Description:  Following introductions and group rules, group members engaged in facilitated group dialogue and support around topic of loss, with particular support around experiences of loss in their lives. Group Identified types of loss (relationships / self / things) and identified patterns, circumstances, and changes that precipitate losses. Reflected on thoughts / feelings around loss, normalized grief responses, and recognized variety in grief experience. Group encouraged individual reflection on safe space and on the coping skills that they are already utilizing.  Group drew on Adlerian / Rogerian and narrative framework  Patient Progress: Joyce [name] attended group and actively engaged and participated in group conversation and activities. She shared about the loss of a grandparent and how it has impacted her.

## 2024-03-27 NOTE — Progress Notes (Signed)
   03/27/24 0530  15 Minute Checks  Location Bedroom  Visual Appearance Calm  Behavior Sleeping  Sleep (Behavioral Health Patients Only)  Calculate sleep? (Click Yes once per 24 hr at 0600 safety check) Yes  Documented sleep last 24 hours 6.25

## 2024-03-27 NOTE — Plan of Care (Signed)
  Problem: Education: Goal: Emotional status will improve Outcome: Progressing   Problem: Activity: Goal: Interest or engagement in activities will improve Outcome: Progressing Goal: Sleeping patterns will improve Outcome: Progressing

## 2024-03-27 NOTE — Group Note (Signed)
 Recreation Therapy Group Note   Group Topic:Problem Solving  Group Date: 03/27/2024 Start Time: 0930 End Time: 1000 Facilitators: Jaliel Deavers-McCall, LRT,CTRS Location: [address] Dayroom   Group Topic: Problem Solving  Goal Area(s) Addresses:  Patient will effectively work in a team with other group members. Patient will verbalize importance of using appropriate problem solving techniques.  Patient will identify positive change associated with effective problem solving skills.   Behavioral Response: Engaged  Intervention: Worksheet, Music  Activity: Science writer. Patients were given a front and back worksheet of brain teasers. Patients worked together to figure out each individual puzzle presented on sheet. Patients had 20 minutes to complete the brain teasers. LRT went over the answers with patients at the end.    Education: Problem Solving, Communication, Cognitive Skills  Education Outcome: Acknowledges understanding/In group clarification offered/Needs additional education.    Affect/Mood: Appropriate   Participation Level: Engaged   Participation Quality: Independent   Behavior: Appropriate   Speech/Thought Process: Focused   Insight: Good   Judgement: Good   Modes of Intervention: Group work   Patient Response to Interventions:  Engaged   Education Outcome:  In group clarification offered    Clinical Observations/Individualized Feedback: Pt came late into group. Pt came in for about the last 5-6 minutes. Pt was quiet but was attentive and focused on getting as many puzzles completed as she could.     Plan: Continue to engage patient in RT group sessions 2-3x/week.   Joyce [name], LRT,CTRS 03/27/2024 12:43 PM

## 2024-03-27 NOTE — Progress Notes (Signed)
 [facility] MD Progress Note  03/27/2024 10:32 AM Joyce [name]  MRN:  [id number]  Principal Problem: Suicidal ideation Diagnosis: Principal Problem:   Suicidal ideation  Reason for admission: Patient is a  23y.o. female with psych history of depressive disorder, who presents to the [facility] unit due to worsening depression resulting in suicidal ideation with plans to 'find a knife and slit her throat' in the context of family altercation and recent break-up with boyfriend.   24-hour chart review:  Patient was seen today for re-evaluation.  Patient Case discussed in the interdisciplinary meeting.  Nursing reports no events overnight. The patient has no issues with performing ADLs.  Patient has been medication compliant.    Today's assessment notes: During rounds today, patient reports, her depression is gradually improving, describing her mood as more stable compared to previous days.  However, mood appeared depressed with congruent affect.  Bupropion  SR 12-hour tablet 100 mg p.o. twice daily increase to 150 mg p.o. twice daily for depression and anxiety.  Patient in agreement with medication adjustment.  She denies any SI, plans, or intent at this time. The patient reports that she is sleeping well through the night and has maintained a good appetite. She denies experiencing any symptoms of psychosis, including auditory or visual hallucinations, paranoia, or feelings of being unsafe. No delusional thoughts were expressed during the interview. She also denies any thoughts, urges, or plans to harm others. The patient reports that she is tolerating her current medications without any side effects.   Labs: no new results for review.  Total Time spent with patient: 45 minutes  Past Psychiatric History: see H&P  Past Medical History:  Past Medical History:  Diagnosis Date   Constipation    Hypopituitarism (HCC)    Left foot drop    Pre-diabetes    Thyroid  disease     Past Surgical History:  Procedure  Laterality Date   CHOLECYSTECTOMY N/A 01/14/2022   Procedure: LAPAROSCOPIC CHOLECYSTECTOMY;  Surgeon: Stechschulte, Deward PARAS, MD;  Location: [facility] OR;  Service: General;  Laterality: N/A;   Family History:  Family History  Problem Relation Age of Onset   Anxiety disorder Mother    Depression Mother    Hypertension Mother    Depression Father    Healthy Father    Drug abuse Brother    Hypertension Paternal Grandmother    Diabetes Paternal Grandmother    Family Psychiatric  History: see H&P Social History:  Social History   Substance and Sexual Activity  Alcohol Use Not Currently     Social History   Substance and Sexual Activity  Drug Use Never    Social History   Socioeconomic History   Marital status: Single    Spouse name: Not on file   Number of children: 0   Years of education: college student now   [employer] education level: Not on file  Occupational History   Occupation: Consulting civil engineer    Comment: Works at [employer]  Tobacco Use   Smoking status: Never   Smokeless tobacco: Never  Vaping Use   Vaping status: Never Used  Substance and Sexual Activity   Alcohol use: Not Currently   Drug use: Never   Sexual activity: Never  Other Topics Concern   Not on file  Social History Narrative   Lives at home with parents.   Right-handed.   No daily caffeine use.   Loves to draw, and write stories.    Social Drivers of Corporate investment banker  Strain: Not on file  Food Insecurity: No Food Insecurity (03/23/2024)   Hunger Vital Sign    Worried About Running Out of Food in the Last Year: Never true    Ran Out of Food in the Last Year: Never true  Transportation Needs: No Transportation Needs (03/23/2024)   PRAPARE - Administrator, Civil Service (Medical): No    Lack of Transportation (Non-Medical): No  Physical Activity: Not on file  Stress: Not on file  Social Connections: Not on file   Additional Social History:    Sleep: Fair Estimated Sleeping  Duration (Last 24 Hours): 4.00-5.75 hours  Appetite:  Fair  Current Medications: Current Facility-Administered Medications  Medication Dose Route Frequency Provider Last Rate Last Admin   acetaminophen  (TYLENOL ) tablet 650 mg  650 mg Oral Q6H PRN Rollene Katz, MD   650 mg at 03/26/24 0753   alum & mag hydroxide-simeth (MAALOX/MYLANTA) 200-200-20 MG/5ML suspension 30 mL  30 mL Oral Q4H PRN Rollene Katz, MD       ARIPiprazole  (ABILIFY ) tablet 2 mg  2 mg Oral Daily Cressida Milford C, FNP   2 mg at 03/26/24 0755   buPROPion  ER (WELLBUTRIN  SR) 12 hr tablet 100 mg  100 mg Oral BID Parker, Alvin S, MD   100 mg at 03/26/24 1659   haloperidol  (HALDOL ) tablet 5 mg  5 mg Oral TID PRN Rollene Katz, MD   5 mg at 03/24/24 2156   And   diphenhydrAMINE  (BENADRYL ) capsule 50 mg  50 mg Oral TID PRN Rollene Katz, MD   50 mg at 03/24/24 2156   haloperidol  lactate (HALDOL ) injection 5 mg  5 mg Intramuscular TID PRN Rollene Katz, MD       And   diphenhydrAMINE  (BENADRYL ) injection 50 mg  50 mg Intramuscular TID PRN Rollene Katz, MD       And   LORazepam  (ATIVAN ) injection 2 mg  2 mg Intramuscular TID PRN Rollene Katz, MD       haloperidol  lactate (HALDOL ) injection 10 mg  10 mg Intramuscular TID PRN Rollene Katz, MD       And   diphenhydrAMINE  (BENADRYL ) injection 50 mg  50 mg Intramuscular TID PRN Rollene Katz, MD       And   LORazepam  (ATIVAN ) injection 2 mg  2 mg Intramuscular TID PRN Rollene Katz, MD       hydrOXYzine  (ATARAX ) tablet 25 mg  25 mg Oral TID PRN Rollene Katz, MD   25 mg at 03/26/24 1659   levothyroxine  (SYNTHROID ) tablet 100 mcg  100 mcg Oral Q0600 Delila Kuklinski C, FNP   100 mcg at 03/27/24 0556   magnesium  hydroxide (MILK OF MAGNESIA) suspension 30 mL  30 mL Oral Daily PRN Rollene Katz, MD       Lab Results: No results found for this or any previous visit (from the past 48 hours).  Blood Alcohol level:  Lab Results   Component Value Date   [facility] <15 03/22/2024   Metabolic Disorder Labs: Lab Results  Component Value Date   HGBA1C 5.7 (H) 03/22/2024   MPG 117 03/22/2024   MPG 114.02 06/17/2023   No results found for: PROLACTIN Lab Results  Component Value Date   CHOL 113 03/22/2024   TRIG 65 03/22/2024   HDL 53 03/22/2024   CHOLHDL 2.1 03/22/2024   VLDL 13 03/22/2024   LDLCALC 47 03/22/2024   LDLCALC 47 06/19/2023   Physical Findings: AIMS:  ,  ,  ,  ,  ,  ,  CIWA:    COWS:     Musculoskeletal: Strength & Muscle Tone: within normal limits Gait & Station: normal Patient leans: N/A  Psychiatric Specialty Exam:  Presentation  General Appearance:  Appropriate for Environment; Casual; Fairly Groomed  Eye Contact: Fair  Speech: Clear and Coherent  Speech Volume: Normal  Handedness: Right  Mood and Affect  Mood: Anxious; Depressed  Affect: Congruent  Thought Process  Thought Processes: Coherent  Descriptions of Associations:Intact  Orientation:Full (Time, Place and Person)  Thought Content:Logical  History of Schizophrenia/Schizoaffective disorder:No  Duration of Psychotic Symptoms:No data recorded Hallucinations:Hallucinations: None  Ideas of Reference:None  Suicidal Thoughts:Suicidal Thoughts: No SI Active Intent and/or Plan: -- (Denies)  Homicidal Thoughts:Homicidal Thoughts: No  Sensorium  Memory: Immediate Good; Recent Good  Judgment: Fair  Insight: Fair  Art therapist  Concentration: Good  Attention Span: Good  Recall: Fair  Fund of Knowledge: Fair  Language: Good  Psychomotor Activity  Psychomotor Activity: Psychomotor Activity: Normal  Assets  Assets: Communication Skills; Desire for Improvement; Housing; Physical Health; Resilience; Social Support  Sleep  Sleep: Sleep: Good Number of Hours of Sleep: 6.25  Physical Exam: Physical Exam Vitals and nursing note reviewed.  Constitutional:      General: She  is not in acute distress.    Appearance: She is obese. She is not ill-appearing.  HENT:     Head: Normocephalic.     Right Ear: External ear normal.     Left Ear: External ear normal.     Nose: Nose normal.     Mouth/Throat:     Mouth: Mucous membranes are moist.     Pharynx: Oropharynx is clear.  Eyes:     Extraocular Movements: Extraocular movements intact.  Cardiovascular:     Rate and Rhythm: Normal rate.     Pulses: Normal pulses.  Pulmonary:     Effort: Pulmonary effort is normal.  Abdominal:     Comments: Deferred  Genitourinary:    Comments: Deferred Musculoskeletal:        General: Normal range of motion.     Cervical back: Normal range of motion.  Skin:    General: Skin is warm.  Neurological:     General: No focal deficit present.     Mental Status: She is alert and oriented to person, place, and time.  Psychiatric:        Mood and Affect: Mood normal.        Behavior: Behavior normal.        Thought Content: Thought content normal.    Review of Systems  Constitutional:  Negative for chills and fever.  HENT:  Negative for sore throat.   Eyes:  Negative for blurred vision.  Respiratory:  Negative for cough, sputum production, shortness of breath and wheezing.   Cardiovascular:  Negative for chest pain and palpitations.  Gastrointestinal:  Negative for abdominal pain, constipation, diarrhea, heartburn, nausea and vomiting.  Genitourinary:  Negative for dysuria.  Musculoskeletal:  Negative for falls.       She continues to wear her left lower leg  boots due to peroneal neuropathy of left lower extremity.  Skin:  Negative for itching and rash.  Neurological:  Negative for dizziness and headaches.  Endo/Heme/Allergies:        See allergy listing  Psychiatric/Behavioral:  Positive for depression. Negative for hallucinations, substance abuse and suicidal ideas. The patient is nervous/anxious. The patient does not have insomnia.    Blood pressure (!) 106/56,  pulse 91, temperature 97.9 F (  36.6 C), resp. rate 20, height 5' 9 (1.753 m), weight 101.6 kg, SpO2 100%. Body mass index is 33.08 kg/m.  Treatment Plan Summary: Daily contact with patient to assess and evaluate symptoms and progress in treatment and Medication management  Patient is a 23 year old female with the above-stated past psychiatric history who is seen in follow-up.  Chart reviewed. Patient discussed with nursing. Overall, the patient appears to be progressing in her treatment, improved mood remains flat, Bupropion  increased to 150 mg BID, stable behavior, and no acute psychiatric or medical concerns noted during today's evaluation.   Diagnoses/ Active problems:  MDD recurrent severe without psychotic features   PLAN: Safety and Monitoring: continue inpatient psych admission; 15-minute checks; daily contact with patient to assess and evaluate symptoms and progress in treatment; psychoeducation.Vital signs: q12 hours. Precautions: suicide, elopement, and assault. Placed on room lock out for meals, snacks and groups.  Medications: --Continue Abilify  2 mg p.o. daily to augment antidepressant --Increase Bupropion  SR from 100 mg to 150 mg PO BID for depression and anxiety --Continue hydroxyzine  tablet 25 mg p.o. 3 times daily as needed for anxiety --Continue trazodone  tablet 50 mg p.o. q. nightly as needed for sleep  PRN medications: acetaminophen , alum & mag hydroxide-simeth, haloperidol  **AND** diphenhydrAMINE , haloperidol  lactate **AND** diphenhydrAMINE  **AND** LORazepam , haloperidol  lactate **AND** diphenhydrAMINE  **AND** LORazepam , hydrOXYzine , magnesium  hydroxide   -- Encouraged patient to participate in unit milieu and in scheduled group therapies    Discharge Planning: -Social work and case management to assist with discharge planning and identification of hospital follow-up needs prior to discharge -Estimated LOS: 3-5 days -Discharge Concerns: Need to establish a safety  plan; Medication compliance and effectiveness -Discharge Goals: Return home with outpatient referrals for mental health follow-up including medication management/psychotherapy  Ellouise JAYSON Azure, FNP 03/27/2024, 10:32 AM Patient ID: Joyce [name], female   DOB: [birth year], 23 y.o.   MRN: [id number]

## 2024-03-27 NOTE — Progress Notes (Signed)
   03/27/24 0800  Psych Admission Type (Psych Patients Only)  Admission Status Voluntary  Psychosocial Assessment  Patient Complaints Depression  Eye Contact Fair  Facial Expression Flat  Affect Appropriate to circumstance  Speech Logical/coherent  Interaction Guarded  Motor Activity Slow  Appearance/Hygiene Unremarkable  Behavior Characteristics Appropriate to situation  Mood Depressed  Thought Process  Coherency WDL  Content WDL  Delusions None reported or observed  Perception WDL  Hallucination None reported or observed  Judgment Limited  Confusion None  Danger to Self  Current suicidal ideation? Denies  Self-Injurious Behavior No self-injurious ideation or behavior indicators observed or expressed   Agreement Not to Harm Self Yes  Description of Agreement verbal  Danger to Others  Danger to Others None reported or observed

## 2024-03-27 NOTE — Plan of Care (Signed)
  Problem: Coping: Goal: Ability to verbalize frustrations and anger appropriately will improve Outcome: Progressing   Problem: Safety: Goal: Periods of time without injury will increase Outcome: Progressing   Problem: Physical Regulation: Goal: Ability to maintain clinical measurements within normal limits will improve Outcome: Progressing

## 2024-03-27 NOTE — Progress Notes (Signed)
   03/27/24 2000  Psych Admission Type (Psych Patients Only)  Admission Status Voluntary  Psychosocial Assessment  Patient Complaints Depression  Eye Contact Fair  Facial Expression Flat  Affect Appropriate to circumstance  Speech Logical/coherent  Interaction Guarded  Motor Activity Slow  Appearance/Hygiene Unremarkable  Behavior Characteristics Appropriate to situation  Mood Depressed  Aggressive Behavior  Effect No apparent injury  Thought Process  Coherency WDL  Content WDL  Delusions WDL  Perception WDL  Hallucination None reported or observed  Judgment Impaired  Danger to Self  Current suicidal ideation? Denies  Danger to Others  Danger to Others None reported or observed

## 2024-03-27 NOTE — Plan of Care (Signed)
   Problem: Education: Goal: Mental status will improve Outcome: Progressing   Problem: Education: Goal: Verbalization of understanding the information provided will improve Outcome: Progressing

## 2024-03-28 DIAGNOSIS — R45851 Suicidal ideations: Secondary | ICD-10-CM | POA: Diagnosis not present

## 2024-03-28 MED ORDER — IBUPROFEN 600 MG PO TABS
600.0000 mg | ORAL_TABLET | Freq: Four times a day (QID) | ORAL | Status: DC | PRN
  Administered 2024-03-28 (×2): 600 mg via ORAL
  Filled 2024-03-28 (×2): qty 1

## 2024-03-28 MED ORDER — BENZOCAINE 10 % MT GEL
Freq: Three times a day (TID) | OROMUCOSAL | Status: DC | PRN
Start: 2024-03-28 — End: 2024-03-29
  Administered 2024-03-28 (×2): 1 via OROMUCOSAL
  Filled 2024-03-28: qty 9

## 2024-03-28 NOTE — Progress Notes (Signed)
(  Sleep Hours) - (Any PRNs that were needed, meds refused, or side effects to meds)- Vistaril  25 mg, Advil  600 mg (Any disturbances and when (visitation, over night)- N/A (Concerns raised by the patient)- my tooth (SI/HI/AVH)- denies

## 2024-03-28 NOTE — Plan of Care (Signed)
   Problem: Education: Goal: Emotional status will improve Outcome: Progressing Goal: Mental status will improve Outcome: Progressing   Problem: Activity: Goal: Interest or engagement in activities will improve Outcome: Progressing

## 2024-03-28 NOTE — Progress Notes (Cosign Needed Addendum)
 [facility] MD Progress Note  03/28/2024 12:54 PM Joyce [name]  MRN:  [id number]  Principal Problem: MDD (major depressive disorder), recurrent severe, without psychosis (HCC) Diagnosis: Principal Problem:   MDD (major depressive disorder), recurrent severe, without psychosis (HCC) Active Problems:   Suicidal ideation  Reason for admission: Patient is a  23y.o. female with psych history of depressive disorder, who presents to the [facility] unit due to worsening depression resulting in suicidal ideation with plans to 'find a knife and slit her throat' in the context of family altercation and recent break-up with boyfriend.   24-hour chart review:  Patient was seen today for re-evaluation.  Patient Case discussed in the interdisciplinary meeting.  Nursing reports no events overnight. The patient has no issues with performing ADLs.  Patient has been medication compliant.    Today's assessment notes: Patient reports that her depression is gradually improving, describing her mood as more stable compared to admission.  BuPROPion  (WELLBUTRIN  XL) 24 hr tablet 300 mg po daily for depression and anxiety continues as ordered. Patient compliant with her treatment regimen.  She denies any SI, plans, or intent at this time. The patient reports poor sleep last night due to left upper tooth ache. Oral jel and ibuprofen  ordered for mouth pain. She reports good appetite. She denies experiencing any symptoms of psychosis, including auditory or visual hallucinations, paranoia, or feelings of being unsafe. No delusional thoughts were expressed during the interview. She also denies any thoughts, urges, or plans to harm others. The patient reports that she is tolerating her current medications without any side effects. No change in her current treatment regimen. Estimated discharge date 03/29/24.    Labs: no new results for review.  Total Time spent with patient: 45 minutes  Past Psychiatric History: see H&P  Past Medical  History:  Past Medical History:  Diagnosis Date   Constipation    Hypopituitarism (HCC)    Left foot drop    Pre-diabetes    Thyroid  disease     Past Surgical History:  Procedure Laterality Date   CHOLECYSTECTOMY N/A 01/14/2022   Procedure: LAPAROSCOPIC CHOLECYSTECTOMY;  Surgeon: Stechschulte, Deward PARAS, MD;  Location: [facility] OR;  Service: General;  Laterality: N/A;   Family History:  Family History  Problem Relation Age of Onset   Anxiety disorder Mother    Depression Mother    Hypertension Mother    Depression Father    Healthy Father    Drug abuse Brother    Hypertension Paternal Grandmother    Diabetes Paternal Grandmother    Family Psychiatric  History: see H&P Social History:  Social History   Substance and Sexual Activity  Alcohol Use Not Currently     Social History   Substance and Sexual Activity  Drug Use Never    Social History   Socioeconomic History   Marital status: Single    Spouse name: Not on file   Number of children: 0   Years of education: college student now   [employer] education level: Not on file  Occupational History   Occupation: Consulting civil engineer    Comment: Works at [employer]  Tobacco Use   Smoking status: Never   Smokeless tobacco: Never  Vaping Use   Vaping status: Never Used  Substance and Sexual Activity   Alcohol use: Not Currently   Drug use: Never   Sexual activity: Never  Other Topics Concern   Not on file  Social History Narrative   Lives at home with parents.   Right-handed.  No daily caffeine use.   Loves to draw, and write stories.    Social Drivers of Corporate investment banker Strain: Not on file  Food Insecurity: No Food Insecurity (03/23/2024)   Hunger Vital Sign    Worried About Running Out of Food in the Last Year: Never true    Ran Out of Food in the Last Year: Never true  Transportation Needs: No Transportation Needs (03/23/2024)   PRAPARE - Administrator, Civil Service (Medical): No    Lack of  Transportation (Non-Medical): No  Physical Activity: Not on file  Stress: Not on file  Social Connections: Not on file   Additional Social History:    Sleep: Fair Estimated Sleeping Duration (Last 24 Hours): 0.00-0.25 hours  Appetite:  Fair  Current Medications: Current Facility-Administered Medications  Medication Dose Route Frequency Provider Last Rate Last Admin   acetaminophen  (TYLENOL ) tablet 650 mg  650 mg Oral Q6H PRN Rollene Katz, MD   650 mg at 03/28/24 1013   alum & mag hydroxide-simeth (MAALOX/MYLANTA) 200-200-20 MG/5ML suspension 30 mL  30 mL Oral Q4H PRN Rollene Katz, MD       ARIPiprazole  (ABILIFY ) tablet 2 mg  2 mg Oral Daily Jaedah Lords C, FNP   2 mg at 03/28/24 9178   benzocaine  (ORAJEL) 10 % mucosal gel   Mouth/Throat TID PRN Trudy Carwin, NP   1 Application at 03/28/24 9787   buPROPion  (WELLBUTRIN  XL) 24 hr tablet 300 mg  300 mg Oral Daily Izediuno, Vincent A, MD   300 mg at 03/28/24 9178   haloperidol  (HALDOL ) tablet 5 mg  5 mg Oral TID PRN Rollene Katz, MD   5 mg at 03/24/24 2156   And   diphenhydrAMINE  (BENADRYL ) capsule 50 mg  50 mg Oral TID PRN Rollene Katz, MD   50 mg at 03/24/24 2156   haloperidol  lactate (HALDOL ) injection 5 mg  5 mg Intramuscular TID PRN Rollene Katz, MD       And   diphenhydrAMINE  (BENADRYL ) injection 50 mg  50 mg Intramuscular TID PRN Rollene Katz, MD       And   LORazepam  (ATIVAN ) injection 2 mg  2 mg Intramuscular TID PRN Rollene Katz, MD       haloperidol  lactate (HALDOL ) injection 10 mg  10 mg Intramuscular TID PRN Rollene Katz, MD       And   diphenhydrAMINE  (BENADRYL ) injection 50 mg  50 mg Intramuscular TID PRN Rollene Katz, MD       And   LORazepam  (ATIVAN ) injection 2 mg  2 mg Intramuscular TID PRN Rollene Katz, MD       hydrOXYzine  (ATARAX ) tablet 25 mg  25 mg Oral TID PRN Rollene Katz, MD   25 mg at 03/27/24 2100   levothyroxine  (SYNTHROID ) tablet 100 mcg   100 mcg Oral Q0600 Zariah Jost C, FNP   100 mcg at 03/28/24 0615   magnesium  hydroxide (MILK OF MAGNESIA) suspension 30 mL  30 mL Oral Daily PRN Rollene Katz, MD       Lab Results: No results found for this or any previous visit (from the past 48 hours).  Blood Alcohol level:  Lab Results  Component Value Date   [facility] <15 03/22/2024   Metabolic Disorder Labs: Lab Results  Component Value Date   HGBA1C 5.7 (H) 03/22/2024   MPG 117 03/22/2024   MPG 114.02 06/17/2023   No results found for: PROLACTIN Lab Results  Component Value Date  CHOL 113 03/22/2024   TRIG 65 03/22/2024   HDL 53 03/22/2024   CHOLHDL 2.1 03/22/2024   VLDL 13 03/22/2024   LDLCALC 47 03/22/2024   LDLCALC 47 06/19/2023   Physical Findings: AIMS:  ,  ,  ,  ,  ,  ,   CIWA:    COWS:     Musculoskeletal: Strength & Muscle Tone: within normal limits Gait & Station: normal Patient leans: N/A  Psychiatric Specialty Exam:  Presentation  General Appearance:  Appropriate for Environment; Casual; Fairly Groomed  Eye Contact: Fair  Speech: Clear and Coherent  Speech Volume: Normal  Handedness: Right  Mood and Affect  Mood: Euthymic  Affect: Congruent  Thought Process  Thought Processes: Coherent  Descriptions of Associations:Intact  Orientation:Full (Time, Place and Person)  Thought Content: Logical  History of Schizophrenia/Schizoaffective disorder:No  Duration of Psychotic Symptoms: N/A Hallucinations:Hallucinations: None  Ideas of Reference:None  Suicidal Thoughts:Suicidal Thoughts: No SI Active Intent and/or Plan: -- (Denies)  Homicidal Thoughts:Homicidal Thoughts: No  Sensorium  Memory: Immediate Good; Recent Good  Judgment: Fair  Insight: Fair  Art therapist  Concentration: Good  Attention Span: Good  Recall: Fair  Fund of Knowledge: Fair  Language: Good  Psychomotor Activity  Psychomotor Activity: Psychomotor Activity: Normal  (Continues to monitor left lower leg boots for her Common peroneal neuropathy of left lower extremity)  Assets  Assets: Communication Skills; Desire for Improvement; Physical Health; Resilience; Social Support  Sleep  Sleep: Sleep: -- (No sleep hours recorded nursing flowsheet) Number of Hours of Sleep: -- (No sleep hours recorded on the nursing flowsheet)  Physical Exam: Physical Exam Vitals and nursing note reviewed.  Constitutional:      General: She is not in acute distress.    Appearance: She is obese. She is not ill-appearing.  HENT:     Head: Normocephalic.     Right Ear: External ear normal.     Left Ear: External ear normal.     Nose: Nose normal.     Mouth/Throat:     Mouth: Mucous membranes are moist.     Pharynx: Oropharynx is clear.  Eyes:     Extraocular Movements: Extraocular movements intact.  Cardiovascular:     Rate and Rhythm: Normal rate.     Pulses: Normal pulses.  Pulmonary:     Effort: Pulmonary effort is normal.  Abdominal:     Comments: Deferred  Genitourinary:    Comments: Deferred Musculoskeletal:        General: Normal range of motion.     Cervical back: Normal range of motion.     Comments: Patient continues to wear her left lower ankle boots due to common peroneal neuropathy of left lower extremity  Skin:    General: Skin is warm.  Neurological:     General: No focal deficit present.     Mental Status: She is alert and oriented to person, place, and time.  Psychiatric:        Mood and Affect: Mood normal.        Behavior: Behavior normal.        Thought Content: Thought content normal.    Review of Systems  Constitutional:  Negative for chills and fever.  HENT:  Negative for sore throat.   Eyes:  Negative for blurred vision.  Respiratory:  Negative for cough, sputum production, shortness of breath and wheezing.   Cardiovascular:  Negative for chest pain and palpitations.  Gastrointestinal:  Negative for abdominal pain,  constipation, diarrhea, heartburn,  nausea and vomiting.       Patient complained of tooth ache, Orajel ordered for patient  Genitourinary:  Negative for dysuria.  Musculoskeletal:  Negative for falls.       She continues to wear her left lower leg  boots due to peroneal neuropathy of left lower extremity.  Skin:  Negative for itching and rash.  Neurological:  Negative for dizziness and headaches.  Endo/Heme/Allergies:        See allergy listing  Psychiatric/Behavioral:  Positive for depression. Negative for hallucinations, substance abuse and suicidal ideas. The patient is nervous/anxious. The patient does not have insomnia.    Blood pressure 108/72, pulse 75, temperature 97.9 F (36.6 C), resp. rate 20, height 5' 9 (1.753 m), weight 101.6 kg, SpO2 100%. Body mass index is 33.08 kg/m.  Treatment Plan Summary: Daily contact with patient to assess and evaluate symptoms and progress in treatment and Medication management  Patient is a 23 year old female with the above-stated past psychiatric history who is seen in follow-up.  Chart reviewed. Patient discussed with nursing. Overall, the patient appears to be progressing in her treatment, improved mood remains flat, Bupropion  increased to 150 mg BID, stable behavior, and no acute psychiatric or medical concerns noted during today's evaluation.  Diagnoses/ Active problems:  MDD recurrent severe without psychotic features   PLAN: Safety and Monitoring: continue inpatient psych admission; 15-minute checks; daily contact with patient to assess and evaluate symptoms and progress in treatment; psychoeducation.Vital signs: q12 hours. Precautions: suicide, elopement, and assault. Placed on room lock out for meals, snacks and groups.  Medications: --Continue Abilify  2 mg p.o. daily to augment antidepressant --Continue buPROPion  (WELLBUTRIN  XL) 24 hr tablet 300 mg po daily for depression and anxiety --Continue hydroxyzine  tablet 25 mg p.o. 3 times  daily as needed for anxiety --Continue trazodone  tablet 50 mg p.o. q. nightly as needed for sleep  PRN medications: acetaminophen , alum & mag hydroxide-simeth, benzocaine , haloperidol  **AND** diphenhydrAMINE , haloperidol  lactate **AND** diphenhydrAMINE  **AND** LORazepam , haloperidol  lactate **AND** diphenhydrAMINE  **AND** LORazepam , hydrOXYzine , magnesium  hydroxide   -- Encouraged patient to participate in unit milieu and in scheduled group therapies    Discharge Planning: -Social work and case management to assist with discharge planning and identification of hospital follow-up needs prior to discharge -Estimated LOS: 3-5 days -Discharge Concerns: Need to establish a safety plan; Medication compliance and effectiveness -Discharge Goals: Return home with outpatient referrals for mental health follow-up including medication management/psychotherapy  Ellouise JAYSON Azure, FNP 03/28/2024, 12:54 PM Patient ID: Joyce [name], female   DOB: [birth year], 23 y.o.   MRN: [id number] Patient ID: Joyce [name], female   DOB: [birth year], 23 y.o.   MRN: [id number]

## 2024-03-28 NOTE — Plan of Care (Signed)
  Problem: Education: Goal: Emotional status will improve Outcome: Progressing Goal: Verbalization of understanding the information provided will improve Outcome: Progressing   Problem: Coping: Goal: Ability to demonstrate self-control will improve Outcome: Progressing   

## 2024-03-28 NOTE — Progress Notes (Signed)
   03/28/24 2030  Psych Admission Type (Psych Patients Only)  Admission Status Voluntary  Psychosocial Assessment  Patient Complaints Depression  Eye Contact Fair  Facial Expression Flat  Affect Appropriate to circumstance  Speech Logical/coherent  Interaction Cautious  Motor Activity Slow  Appearance/Hygiene Poor hygiene;Body odor  Behavior Characteristics Appropriate to situation  Mood Depressed  Aggressive Behavior  Effect No apparent injury  Thought Process  Coherency WDL  Content WDL  Delusions WDL  Perception WDL  Hallucination None reported or observed  Judgment Impaired  Confusion WDL  Danger to Self  Current suicidal ideation? Denies  Danger to Others  Danger to Others None reported or observed

## 2024-03-28 NOTE — Progress Notes (Signed)
 Pt encouraged to talk to the doctor about her medication and her tooth pain.

## 2024-03-28 NOTE — Progress Notes (Incomplete Revision)
(  Sleep Hours) - (Any PRNs that were needed, meds refused, or side effects to meds)- Vistaril  25 mg, Advil  600 mg , Haldol  5 mg , Benadryl  50 mg (Any disturbances and when (visitation, over night)- N/A (Concerns raised by the patient)- my tooth , pt agitated about tooth , given PRNs per [facility] , penicillin may help I told them about it earlier, all the did was give me Ibuprofen , I need to be discharged tomorrow so I can see a dentist or something (SI/HI/AVH)- denies

## 2024-03-28 NOTE — Progress Notes (Signed)
 Adult Psychoeducational Group Note  Date:  03/28/2024 Time:  9:50 AM  Group Topic/Focus:  Goals Group:   The focus of this group is to help patients establish daily goals to achieve during treatment and discuss how the patient can incorporate goal setting into their daily lives to aide in recovery.  Participation Level:  Active  Participation Quality:  Appropriate  Affect:  Appropriate  Cognitive:  Appropriate  Insight: Appropriate  Engagement in Group:  Engaged  Modes of Intervention:  Discussion  Additional Comments:  Pt stated she is not feeling well.  Pt goal for the day is to be discharged.  Daine Pillar D 03/28/2024, 9:50 AM

## 2024-03-28 NOTE — Group Note (Signed)
 Date:  03/28/2024 Time:  9:32 PM  Group Topic/Focus:  Wrap-Up Group:   The focus of this group is to help patients review their daily goal of treatment and discuss progress on daily workbooks.    Participation Level:  Active  Participation Quality:  Appropriate and Attentive  Affect:  Appropriate  Cognitive:  Alert and Appropriate  Insight: Appropriate and Good  Engagement in Group:  Engaged  Modes of Intervention:  Discussion and Education  Additional Comments:  Pt attended and participated in wrap up group this evening and rated their day a 6/10. Pt stated that they have not had much sleep, due to a toothache they are experiencing. Pt states that they were able to speak to their mother, which positively impacted their day. Pt goal is to be D/C soon, so that they can address their tooth pain and also states that they feel that their needs have been met here at [facility]. Pt is requesting to speak with the Dr about recent medication changes.   Joyce [name] 03/28/2024, 9:32 PM

## 2024-03-28 NOTE — Group Note (Signed)
 Recreation Therapy Group Note   Group Topic:Communication  Group Date: 03/28/2024 Start Time: 9057 End Time: 1025 Facilitators: Ruweyda Macknight-McCall, LRT,CTRS Location: [address] Dayroom   Group Topic: Communication, Problem Solving   Goal Area(s) Addresses:  Patient will effectively listen to complete activity.  Patient will identify communication skills used to make activity successful.  Patient will identify how skills used during activity can be used to reach post d/c goals.    Behavioral Response: Minimal   Intervention: Building surveyor Activity - Geometric pattern cards, pencils, blank paper    Activity: Geometric Drawings.  Three volunteers from the peer group will be shown an abstract picture with a particular arrangement of geometrical shapes.  Each round, one 'speaker' will describe the pattern, as accurately as possible without revealing the image to the group.  The remaining group members will listen and draw the picture to reflect how it is described to them. Patients with the role of 'listener' cannot ask clarifying questions but, may request that the speaker repeat a direction. Once the drawings are complete, the presenter will show the rest of the group the picture and compare how close each person came to drawing the picture. LRT will facilitate a post-activity discussion regarding effective communication and the importance of planning, listening, and asking for clarification in daily interactions with others.  Education: Environmental consultant, Active listening, Support systems, Discharge planning  Education Outcome: Acknowledges understanding/In group clarification offered/Needs additional education.    Affect/Mood: Flat   Participation Level: Minimal   Participation Quality: Independent   Behavior: Appropriate   Speech/Thought Process: Barely audible    Insight: None   Judgement: None   Modes of Intervention: Activity   Patient Response to  Interventions:  Receptive   Education Outcome:  In group clarification offered    Clinical Observations/Individualized Feedback: Pt was quiet but appeared to be attentive to the activity. Pt was called out of group but returned later in the group.     Plan: Continue to engage patient in RT group sessions 2-3x/week.   Quinterius Gaida-McCall, LRT,CTRS 03/28/2024 12:48 PM

## 2024-03-28 NOTE — Progress Notes (Signed)
 CSW spoke to patient at her request. Patient reported wanting to begin working on her discharge plan as she feels ready to discharge and return home. She reported that she was informed about the provider changing her medications today which would prolong her stay here. Informed patient I would relay message to providers regarding her wanting an updated discharge date.    Marce Charlesworth, LCSWA 03/28/2024 1:46PM

## 2024-03-28 NOTE — Progress Notes (Signed)
   03/28/24 0900  Psych Admission Type (Psych Patients Only)  Admission Status Voluntary  Psychosocial Assessment  Patient Complaints Depression;Anxiety  Eye Contact Fair  Facial Expression Flat  Affect Appropriate to circumstance  Speech Logical/coherent  Interaction Cautious  Motor Activity Slow  Appearance/Hygiene Unremarkable  Behavior Characteristics Appropriate to situation  Mood Depressed  Thought Process  Coherency WDL  Content WDL  Delusions None reported or observed  Perception WDL  Hallucination None reported or observed  Judgment Impaired  Confusion None  Danger to Self  Current suicidal ideation? Denies  Agreement Not to Harm Self Yes  Description of Agreement verbal  Danger to Others  Danger to Others None reported or observed

## 2024-03-28 NOTE — Discharge Summary (Signed)
 Physician Discharge Summary Note  Patient:  Joyce [name] is an 23 y.o., female MRN:  [id number] DOB:  [birth year] Patient phone:  [phone] (home)  Patient address:   [address] [facility] [address],  Total Time spent with patient: 45 minutes  Date of Admission:  03/23/2024 Date of Discharge:   03/29/2024  Reason for Admission:  Joyce [name] is a 23 year old African-American female with prior psychiatric diagnoses significant for major depressive disorder recurrent, severe without psychotic features, generalized anxiety disorder, and suicidal ideation who presents voluntarily to [facility] from [facility] behavioral health urgent care for worsening depression resulting in suicidal ideation with plans to 'find a knife and slit her throat' in the context of family altercation and recent break-up with boyfriend.  UDS: No substances detected.   Principal Problem: MDD (major depressive disorder), recurrent severe, without psychosis (HCC) Discharge Diagnoses: Principal Problem:   MDD (major depressive disorder), recurrent severe, without psychosis (HCC) Active Problems:   Suicidal ideation  Past Psychiatric History: Previous Psych Diagnoses: MDD, GAD, SI, Prior inpatient treatment: Yes x 2 at [facility]; in December 2022 for MDD recurrent severe, and October 2024 for GAD Current/prior outpatient treatment: Yes, receiving therapy at [facility]. Prior rehab hx: Denies Psychotherapy hx: Yes History of suicide: History of suicide attempt x 2 History of homicide or aggression: Denies Psychiatric medication history: Patient has been on trial Auvelity , Abilify , trazodone  and hydroxyzine  Psychiatric medication compliance history: Compliance Neuromodulation history: Denies. Current Psychiatrist: Yes, Dr. Redell Silvan Current therapist: Yes, Rojelio Dad  Past Medical History:  Past Medical History:  Diagnosis Date   Constipation    Hypopituitarism (HCC)     Left foot drop    Pre-diabetes    Thyroid  disease     Past Surgical History:  Procedure Laterality Date   CHOLECYSTECTOMY N/A 01/14/2022   Procedure: LAPAROSCOPIC CHOLECYSTECTOMY;  Surgeon: Stechschulte, Deward PARAS, MD;  Location: [facility] OR;  Service: General;  Laterality: N/A;   Family History:  Family History  Problem Relation Age of Onset   Anxiety disorder Mother    Depression Mother    Hypertension Mother    Depression Father    Healthy Father    Drug abuse Brother    Hypertension Paternal Grandmother    Diabetes Paternal Grandmother    Family Psychiatric  History: See H&P Social History:  Social History   Substance and Sexual Activity  Alcohol Use Not Currently     Social History   Substance and Sexual Activity  Drug Use Never    Social History   Socioeconomic History   Marital status: Single    Spouse name: Not on file   Number of children: 0   Years of education: college student now   [employer] education level: Not on file  Occupational History   Occupation: Consulting civil engineer    Comment: Works at [employer]  Tobacco Use   Smoking status: Never   Smokeless tobacco: Never  Vaping Use   Vaping status: Never Used  Substance and Sexual Activity   Alcohol use: Not Currently   Drug use: Never   Sexual activity: Never  Other Topics Concern   Not on file  Social History Narrative   Lives at home with parents.   Right-handed.   No daily caffeine use.   Loves to draw, and write stories.    Social Drivers of Corporate investment banker Strain: Not on file  Food Insecurity: No Food Insecurity (03/23/2024)   Hunger Vital  Sign    Worried About Programme researcher, broadcasting/film/video in the Last Year: Never true    Ran Out of Food in the Last Year: Never true  Transportation Needs: No Transportation Needs (03/23/2024)   PRAPARE - Administrator, Civil Service (Medical): No    Lack of Transportation (Non-Medical): No  Physical Activity: Not on file  Stress: Not on file  Social  Connections: Not on file   Hospital Course:  During the patient's hospitalization, patient had extensive initial psychiatric evaluation, and follow-up psychiatric evaluations every day.  Psychiatric diagnoses provided upon initial assessment:  Diagnosis:  Principal Problem:   Suicidal ideation   MDD   GAD  Patient's psychiatric medications were adjusted on admission:  --Continue Auvelity  45-105 MG TBCR twice daily (8 hours between doses) for depression and anxiety --Continue Abilify  2 mg p.o. daily to augment antidepressant --Continue hydroxyzine  tablet 25 mg p.o. 3 times daily as needed for anxiety --Continue trazodone  tablet 50 mg p.o. q. nightly as needed for sleep   During the hospitalization, other adjustments were made to the patient's psychiatric medication regimen:  --Auvelity  45-105 MG TBCR twice daily (8 hours between doses) for depression and anxiety was changed to Wellbutrin  XL 24-hour tablet 300 mg p.o. daily for depression and anxiety  Patient's care was discussed during the interdisciplinary team meeting every day during the hospitalization.  The patient denies having side effects to prescribed psychiatric medication.  Gradually, patient started adjusting to milieu. The patient was evaluated each day by a clinical provider to ascertain response to treatment. Improvement was noted by the patient's report of decreasing symptoms, improved sleep and appetite, affect, medication tolerance, behavior, and participation in unit programming.  Patient was asked each day to complete a self inventory noting mood, mental status, pain, new symptoms, anxiety and concerns.    Symptoms were reported as significantly decreased or resolved completely by discharge.   On day of discharge, the patient reports that their mood is stable. The patient denied having suicidal thoughts for more than 48 hours prior to discharge.  Patient denies having homicidal thoughts.  Patient denies having auditory  hallucinations.  Patient denies any visual hallucinations or other symptoms of psychosis. The patient was motivated to continue taking medication with a goal of continued improvement in mental health.   The patient reports their target psychiatric symptoms of depression responded well to the psychiatric medications, and the patient reports overall benefit other psychiatric hospitalization. Supportive psychotherapy was provided to the patient. The patient also participated in regular group therapy while hospitalized. Coping skills, problem solving as well as relaxation therapies were also part of the unit programming.  Labs were reviewed with the patient, and abnormal results were discussed with the patient.  The patient is able to verbalize their individual safety plan to this provider.  # It is recommended to the patient to continue psychiatric medications as prescribed, after discharge from the hospital.    # It is recommended to the patient to follow up with your outpatient psychiatric provider and PCP.  # It was discussed with the patient, the impact of alcohol, drugs, tobacco have been there overall psychiatric and medical wellbeing, and total abstinence from substance use was recommended the patient.ed.  # Prescriptions provided or sent directly to preferred pharmacy at discharge. Patient agreeable to plan. Given opportunity to ask questions. Appears to feel comfortable with discharge.    # In the event of worsening symptoms, the patient is instructed to call the crisis  hotline, 911 and or go to the nearest ED for appropriate evaluation and treatment of symptoms. To follow-up with primary care provider for other medical issues, concerns and or health care needs  # Patient was discharged to home with parents with a plan to follow up as noted below.   Physical Findings: AIMS:  , ,  ,  ,  ,  ,   CIWA:    COWS:     Musculoskeletal: Strength & Muscle Tone: within normal limits Gait &  Station: normal Patient leans: N/A  Psychiatric Specialty Exam:  Presentation  General Appearance:  Appropriate for Environment; Casual; Fairly Groomed  Eye Contact: Fair  Speech: Clear and Coherent  Speech Volume: Normal  Handedness: Right  Mood and Affect  Mood: Euthymic  Affect: Congruent  Thought Process  Thought Processes: Coherent  Descriptions of Associations:Intact  Orientation:Full (Time, Place and Person)  Thought Content:Logical  History of Schizophrenia/Schizoaffective disorder:No  Duration of Psychotic Symptoms:No data recorded Hallucinations:Hallucinations: None  Ideas of Reference:None  Suicidal Thoughts:Suicidal Thoughts: No SI Active Intent and/or Plan: -- (Denies)  Homicidal Thoughts:Homicidal Thoughts: No  Sensorium  Memory: Immediate Good; Recent Good  Judgment: Fair  Insight: Fair  Art therapist  Concentration: Good  Attention Span: Good  Recall: Fair  Fund of Knowledge: Fair  Language: Good  Psychomotor Activity  Psychomotor Activity: Psychomotor Activity: Normal (Continues to monitor left lower leg boots for her Common peroneal neuropathy of left lower extremity)  Assets  Assets: Communication Skills; Desire for Improvement; Physical Health; Resilience; Social Support  Sleep  Sleep: Sleep: -- (No sleep hours recorded nursing flowsheet)  Estimated Sleeping Duration (Last 24 Hours): 0.00-0.25 hours  Physical Exam: Physical Exam Vitals and nursing note reviewed.  Constitutional:      General: She is not in acute distress.    Appearance: She is obese. She is not ill-appearing.  HENT:     Head: Normocephalic.     Right Ear: External ear normal.     Left Ear: External ear normal.     Nose: Nose normal.     Mouth/Throat:     Mouth: Mucous membranes are moist.     Pharynx: Oropharynx is clear.  Eyes:     Extraocular Movements: Extraocular movements intact.  Cardiovascular:     Rate and  Rhythm: Normal rate.     Pulses: Normal pulses.  Pulmonary:     Effort: Pulmonary effort is normal.  Abdominal:     Comments: Deferred  Genitourinary:    Comments: Deferred Musculoskeletal:        General: Normal range of motion.     Cervical back: Normal range of motion.  Skin:    General: Skin is warm.  Neurological:     General: No focal deficit present.     Mental Status: She is alert and oriented to person, place, and time.  Psychiatric:        Mood and Affect: Mood normal.        Behavior: Behavior normal.        Thought Content: Thought content normal.    Review of Systems  Constitutional:  Negative for chills and fever.  HENT:  Negative for sore throat.   Eyes:  Negative for blurred vision.  Respiratory:  Negative for cough, sputum production, shortness of breath and wheezing.   Cardiovascular:  Negative for chest pain and palpitations.  Gastrointestinal:  Negative for abdominal pain, diarrhea, heartburn, nausea and vomiting.  Genitourinary:  Negative for dysuria.  Musculoskeletal:  Negative  for falls.       Continues to wear her left ankle boots for her common peroneal neuropathy of left lower extremity  Skin:  Negative for itching and rash.  Neurological:  Negative for dizziness and headaches.  Endo/Heme/Allergies:        See allergy listing  Psychiatric/Behavioral:  Positive for depression (Stable with medication). Negative for hallucinations, substance abuse and suicidal ideas. The patient is nervous/anxious (Improved with medication). The patient does not have insomnia.    Blood pressure 108/72, pulse 75, temperature 97.9 F (36.6 C), resp. rate 20, height 5' 9 (1.753 m), weight 101.6 kg, SpO2 100%. Body mass index is 33.08 kg/m.  Social History   Tobacco Use  Smoking Status Never  Smokeless Tobacco Never   Tobacco Cessation:  N/A, patient does not currently use tobacco products  Blood Alcohol level:  Lab Results  Component Value Date   [facility] <15  03/22/2024   Metabolic Disorder Labs:  Lab Results  Component Value Date   HGBA1C 5.7 (H) 03/22/2024   MPG 117 03/22/2024   MPG 114.02 06/17/2023   No results found for: PROLACTIN Lab Results  Component Value Date   CHOL 113 03/22/2024   TRIG 65 03/22/2024   HDL 53 03/22/2024   CHOLHDL 2.1 03/22/2024   VLDL 13 03/22/2024   LDLCALC 47 03/22/2024   LDLCALC 47 06/19/2023   See Psychiatric Specialty Exam and Suicide Risk Assessment completed by Attending Physician prior to discharge.  Discharge destination:  Home  Is patient on multiple antipsychotic therapies at discharge:  No   Has Patient had three or more failed trials of antipsychotic monotherapy by history:  No  Recommended Plan for Multiple Antipsychotic Therapies: NA   Follow-up Information     [facility]. Go on 04/10/2024.   Specialty: Behavioral Health Why: You have an appointment for medication management services on 04/10/24 at 10:00 am.  Please schedule an appointment for therapy services with this provider as well, as soon as possible. Contact information: [address] [place]  [address] [phone]        [facility] Follow up on 04/04/2024.   Why: Please call this provider personally on 04/04/24 at 9:00 am to schedule an appointment for therapy services. Contact information: [address] [place] [address] [phone]                Follow-up recommendations:   Discharge Recommendations:    The patient is being discharged with her family.   Patient is to take her discharge medications as ordered. ?See follow up above.   We recommend that she participates in individual therapy to target uncontrollable agitation and substance abuse.    We recommend that she participates in therapy to target the conflict with her family, to improve communication skills and conflict resolution skills. ?patient is to  initiate/implement a contingency based behavioral model to address patient's behavior.   We recommend that she gets AIMS scale, height, weight, blood pressure, fasting lipid panel, fasting blood sugar in three months from discharge if she's on atypical antipsychotics.    Patient will benefit from monitoring of recurrent suicidal ideation since patient is on antidepressant medication.   The patient should abstain from all illicit substances and alcohol.   If the patient's symptoms worsen or do not continue to improve or if the patient becomes actively suicidal or homicidal then it is recommended that the patient return to the closest hospital emergency room  or call 911 for further evaluation and treatment. National Suicide Prevention Lifeline 1800-SUICIDE or (402)106-5042.   Please follow up with your primary medical doctor for all other medical needs.    The patient has been educated on the possible side effects to medications and she/her guardian is to contact a medical professional and inform outpatient provider of any new side effects of medication.   She is to take regular diet and activity as tolerated. ?Will benefit from moderate daily exercise.   Patient was educated about removing/locking any firearms, medications or dangerous products from the home.   Activity:  As tolerated   Diet:  Regular Diet    Signed:  Ellouise JAYSON Azure, FNP 03/28/2024, 5:23 PM

## 2024-03-28 NOTE — Group Note (Signed)
 LCSW Group Therapy Note   Group Date: 03/28/2024 Start Time: 1100 End Time: 1200   Participation:  did not attend  Type of Therapy:  Group Therapy  Topic:  Healing Hearts: A Safe Space for Grief  Objective:  The objective of this group, Healing Hearts: A Safe Space for Grief, is to create a compassionate environment where participants can process their grief, explore different stages of grief, and discover ways to honor their loved ones through personal rituals.  3 Goals: Provide a safe and supportive space where participants feel comfortable sharing their feelings and experiences of grief without judgment. Educate participants about the stages of grief and emphasize that there is no right way to grieve or a fixed timeline for healing. Introduce the concept of rituals as a means to process grief, allowing individuals to honor their loved ones in a personal and meaningful way.  Summary:  In Healing Hearts: A Safe Space for Grief, we explored the unique and personal journey of grief, emphasizing that everyone experiences it differently. We discussed the five stages of grief (denial, anger, bargaining, depression, and acceptance), with the understanding that grief is not linear. Rituals were introduced as a way to help cope with loss, offering comfort and connection through meaningful actions such as lighting candles or taking memory walks. Participants were encouraged to express their emotions, focus on self-care, and reflect on moments of gratitude for their loved ones, recognizing that healing is a process and there is no timeline for grief.   Keerthi Hazell O Loyed Wilmes, LCSWA 03/28/2024  5:27 PM

## 2024-03-29 DIAGNOSIS — R45851 Suicidal ideations: Secondary | ICD-10-CM

## 2024-03-29 MED ORDER — BENZOCAINE 10 % MT GEL: Freq: Three times a day (TID) | OROMUCOSAL | 0 refills | Status: DC | PRN

## 2024-03-29 MED ORDER — BUPROPION HCL ER (XL) 300 MG PO TB24: 300.0000 mg | ORAL_TABLET | Freq: Every day | ORAL | 0 refills | Status: DC

## 2024-03-29 MED ORDER — AMOXICILLIN-POT CLAVULANATE 875-125 MG PO TABS
1.0000 | ORAL_TABLET | Freq: Two times a day (BID) | ORAL | Status: DC
  Administered 2024-03-29: 1 via ORAL
  Filled 2024-03-29: qty 1

## 2024-03-29 MED ORDER — IBUPROFEN 600 MG PO TABS: 600.0000 mg | ORAL_TABLET | Freq: Four times a day (QID) | ORAL | 0 refills | Status: AC | PRN

## 2024-03-29 MED ORDER — ARIPIPRAZOLE 2 MG PO TABS: 2.0000 mg | ORAL_TABLET | Freq: Every day | ORAL | 0 refills | Status: DC

## 2024-03-29 MED ORDER — HYDROXYZINE HCL 25 MG PO TABS: 25.0000 mg | ORAL_TABLET | Freq: Three times a day (TID) | ORAL | 0 refills | Status: DC | PRN

## 2024-03-29 MED ORDER — LEVOTHYROXINE SODIUM 100 MCG PO TABS: 100.0000 ug | ORAL_TABLET | Freq: Every day | ORAL | 0 refills | Status: DC

## 2024-03-29 NOTE — Progress Notes (Signed)
   03/29/24 0900  Psych Admission Type (Psych Patients Only)  Admission Status Voluntary  Psychosocial Assessment  Patient Complaints None;Other (Comment) (toothache)  Eye Contact Fair  Facial Expression Flat  Affect Appropriate to circumstance  Speech Logical/coherent  Interaction Assertive  Motor Activity Slow  Appearance/Hygiene Unremarkable  Behavior Characteristics Appropriate to situation  Mood Pleasant  Thought Process  Coherency WDL  Content WDL  Delusions None reported or observed  Perception WDL  Hallucination None reported or observed  Judgment Impaired  Confusion None  Danger to Self  Current suicidal ideation? Denies  Agreement Not to Harm Self Yes  Description of Agreement verbal  Danger to Others  Danger to Others None reported or observed

## 2024-03-29 NOTE — BHH Suicide Risk Assessment (Signed)
 [facility] Discharge Suicide Risk Assessment   Principal Problem: MDD (major depressive disorder), recurrent severe, without psychosis (HCC) Discharge Diagnoses: Principal Problem:   MDD (major depressive disorder), recurrent severe, without psychosis (HCC) Active Problems:   Suicidal ideation   Total Time spent with patient: 30 minutes  Musculoskeletal: Strength & Muscle Tone: within normal limits Gait & Station: normal Patient leans: N/A  Psychiatric Specialty Exam  Presentation  General Appearance:  Casually dressed, not in acute distress, appropriate behavior, engaged politely.  No EPS.  Eye Contact: Good.  Speech: Spontaneous.  Normal rate, tone and volume.  Normal prosody of speech.  Mood and Affect  Mood: Euthymic.  Affect: Full range and appropriate.  Thought Process  Thought Processes: Linear and goal directed.  Descriptions of Associations:Intact  Orientation:Full (Time, Place and Person)  Thought Content: Future oriented.  No current suicidal thoughts.  No homicidal thoughts.  No thoughts of violence.  No negative ruminative flooding.  No guilty ruminations.  No delusional theme.  No obsessions.  Hallucinations: No hallucination in any modality.  Sensorium  Memory: Good.  Judgment: Good.  Insight: Good  Executive Functions  Concentration: Good.  Attention Span: Good.  Recall: Good.  Fund of Knowledge: Good.  Language: Good   Psychomotor Activity  Normal psychomotor activity   Physical Exam: Physical Exam ROS Blood pressure 103/68, pulse 67, temperature 98.2 F (36.8 C), temperature source Oral, resp. rate 20, height 5' 9 (1.753 m), weight 101.6 kg, SpO2 100%. Body mass index is 33.08 kg/m.  Mental Status Per Nursing Assessment::   On Admission:  Suicidal ideation indicated by others  Demographic Factors:  NA  Loss Factors: Decline in physical health  Historical Factors: Prior suicide attempts, Family history of  mental illness or substance abuse, and Impulsivity  Risk Reduction Factors:   Positive social support, Positive therapeutic relationship, and Positive coping skills or problem solving skills  Continued Clinical Symptoms:  Mood is stabilizing appropriately.  Patient is no longer feeling depressed.  No overwhelming anxiety.  No evidence of mania.  No evidence of psychosis. Patient reports toothache.  We initiated antibiotic treatment today.  Patient will follow up with her dentist.  Cognitive Features That Contribute To Risk:  None    Suicide Risk:  Minimal: No current suicidal thoughts.  No homicidal thoughts.  No thoughts of violence.  Modifiable risk factor targeted during this admission as mood symptoms.  Patient has responded well to medication adjustments.  Toothache as her new stressor. Patient is stable for care at the lower setting.  Follow-up Information     [facility]. Go on 04/10/2024.   Specialty: Behavioral Health Why: You have an appointment for medication management services on 04/10/24 at 10:00 am.  Please schedule an appointment for therapy services with this provider as well, as soon as possible. Contact information: [address], [address] [place]  [address] [phone]        [facility] Follow up on 04/04/2024.   Why: Please call this provider personally on 04/04/24 at 9:00 am to schedule an appointment for therapy services. Contact information: [address] [place] [address] [phone]                 Plan Of Care/Follow-up recommendations:  See discharge summary  Jerrell DELENA Forehand, MD 03/29/2024, 9:48 AM

## 2024-03-29 NOTE — Progress Notes (Signed)
 Discharge Note:  Patient denies SI/HI/AVH at this time. Discharge instructions, AVS, prescriptions, and transition record gone over with patient. Patient agrees to adhere with medication management, follow-up visit, and outpatient therapy. Patient belongings returned to patient. Patient questions and concerns addressed and answered. Patient ambulatory off unit. Patient discharged to home with parents.

## 2024-03-29 NOTE — Progress Notes (Signed)
  [facility] Adult Case Management Discharge Plan :  Will you be returning to the same living situation after discharge:  Yes,  Pt will be returning home after discharge.  At discharge, do you have transportation home?: Yes,  pt will be discharging home; mother/father to provide transportation. Do you have the ability to pay for your medications: Yes,  pt is insured - [employer] of information consent forms completed and in the chart;  Patient's signature needed at discharge.  Patient to Follow up at:  Follow-up Information     [facility]. Go on 04/10/2024.   Specialty: Behavioral Health Why: You have an appointment for medication management services on 04/10/24 at 10:00 am.  Please schedule an appointment for therapy services with this provider as well, as soon as possible. Contact information: [address] [place]  [address] [phone]        [facility] Follow up on 04/04/2024.   Why: Please call this provider personally on 04/04/24 at 9:00 am to schedule an appointment for therapy services. Contact information: [address] [place] [address] [phone]                 Next level of care provider has access to [facility] Link:no  Safety Planning and Suicide Prevention discussed: Yes,  completed with  [name] [phone])       Has patient been referred to the Quitline?: Patient does not use tobacco/nicotine products  Patient has been referred for addiction treatment: No known substance use disorder.  Joyce [name], LCSWA 03/29/2024, 9:41 AM

## 2024-03-29 NOTE — BHH Group Notes (Signed)
 The focus of this group is to help patients establish daily goals to achieve during treatment and discuss how the patient can incorporate goal setting into their daily lives to aide in recovery.        Scale 1-10:  10      Goals: Discharge today

## 2024-04-10 ENCOUNTER — Ambulatory Visit (INDEPENDENT_AMBULATORY_CARE_PROVIDER_SITE_OTHER): Admitting: Behavioral Health

## 2024-04-10 ENCOUNTER — Encounter: Payer: Self-pay | Admitting: Behavioral Health

## 2024-04-10 DIAGNOSIS — F411 Generalized anxiety disorder: Secondary | ICD-10-CM | POA: Diagnosis not present

## 2024-04-10 DIAGNOSIS — F331 Major depressive disorder, recurrent, moderate: Secondary | ICD-10-CM

## 2024-04-10 DIAGNOSIS — F3130 Bipolar disorder, current episode depressed, mild or moderate severity, unspecified: Secondary | ICD-10-CM

## 2024-04-10 MED ORDER — HYDROXYZINE HCL 25 MG PO TABS: 25.0000 mg | ORAL_TABLET | Freq: Three times a day (TID) | ORAL | 3 refills | Status: DC | PRN

## 2024-04-10 MED ORDER — ARIPIPRAZOLE 5 MG PO TABS: 5.0000 mg | ORAL_TABLET | Freq: Every day | ORAL | 2 refills | Status: DC

## 2024-04-10 MED ORDER — AUVELITY 45-105 MG PO TBCR: EXTENDED_RELEASE_TABLET | ORAL | 3 refills | Status: DC

## 2024-04-10 NOTE — Progress Notes (Signed)
 [facility]  Patient ID: VALBONA SLABACH,  MRN: [id number]  PCP: [name], MD  Date of Evaluation: 04/10/2024 Time spent:30 minutes  Chief Complaint:  Chief Complaint   Anxiety; Depression; Follow-up; Patient Education; Stress; Medication Refill; Family Problem     HISTORY/CURRENT STATUS: HPI  73, 23 year old female presents to this office  for follow up and medication management. Says she was hospitalized again on 7/9. She reports doing better. Still has increased psychomotor agitation in legs bilat.  Improved eye contact and more talkative today. She is stable when taking medication correctly. Still having family problems living at home. Switched her to Wellbutrin  from Auvelity  in the hospital but her father complained and says she has restarted Auvelity  again.  Report anxiety at 4/10 and depression at 4/10.   She says that she is not currently suicidal and contracts for safety. Denies Mania, No psychosis, No  current SI/HI.   No previous psychiatric medication failures.     Individual Medical History/ Review of Systems: Changes? :No   Allergies: Patient has no known allergies.  Current Medications:  Current Outpatient Medications:    ARIPiprazole  (ABILIFY ) 5 MG tablet, Take 1 tablet (5 mg total) by mouth daily., Disp: 30 tablet, Rfl: 2   Dextromethorphan -buPROPion  ER (AUVELITY ) 45-105 MG TBCR, Take one tablet by mouth twice daily 8 hours between doses, Disp: 60 tablet, Rfl: 3   ARIPiprazole  (ABILIFY ) 2 MG tablet, Take 1 tablet (2 mg total) by mouth daily., Disp: 30 tablet, Rfl: 0   benzocaine  (ORAJEL) 10 % mucosal gel, Use as directed in the mouth or throat 3 (three) times daily as needed for mouth pain. Mouth pain, Disp: 5.3 g, Rfl: 0   buPROPion  (WELLBUTRIN  XL) 300 MG 24 hr tablet, Take 1 tablet (300 mg total) by mouth daily., Disp: 30 tablet, Rfl: 0   hydrOXYzine  (ATARAX ) 25 MG tablet, Take 1 tablet (25 mg total) by mouth 3 (three) times daily as  needed for anxiety., Disp: 90 tablet, Rfl: 3   ibuprofen  (ADVIL ) 600 MG tablet, Take 1 tablet (600 mg total) by mouth every 6 (six) hours as needed for moderate pain (pain score 4-6)., Disp: 30 tablet, Rfl: 0   levothyroxine  (SYNTHROID ) 100 MCG tablet, Take 1 tablet (100 mcg total) by mouth daily at 6 (six) AM., Disp: 30 tablet, Rfl: 0 Medication Side Effects: none  Family Medical/ Social History: Changes? No  MENTAL HEALTH EXAM:  There were no vitals taken for this visit.There is no height or weight on file to calculate BMI.  General Appearance: Casual and Neat  Eye Contact:  Good  Speech:  Pressured  Volume:  Decreased  Mood:  Anxious, Depressed, and Dysphoric  Affect:  Depressed, Flat, and Anxious  Thought Process:  Coherent  Orientation:  Full (Time, Place, and Person)  Thought Content: Logical   Suicidal Thoughts:  No  Homicidal Thoughts:  No  Memory:  WNL  Judgement:  Fair  Insight:  Fair  Psychomotor Activity:  Normal  Concentration:  Concentration: Good  Recall:  Good  Fund of Knowledge: Fair  Language: Fair  Assets:  Desire for Improvement Resilience  ADL's:  Intact  Cognition: WNL  Prognosis:  Good    DIAGNOSES:    ICD-10-CM   1. Bipolar I disorder, most recent episode depressed (HCC)  F31.30 ARIPiprazole  (ABILIFY ) 5 MG tablet    2. Generalized anxiety disorder  F41.1 ARIPiprazole  (ABILIFY ) 5 MG tablet    hydrOXYzine  (ATARAX ) 25 MG tablet  Dextromethorphan -buPROPion  ER (AUVELITY ) 45-105 MG TBCR    3. Moderate episode of recurrent major depressive disorder (HCC)  F33.1 Dextromethorphan -buPROPion  ER (AUVELITY ) 45-105 MG TBCR      Receiving Psychotherapy: No    RECOMMENDATIONS:   Greater than 50% of 30 min video visit time with patient was spent on counseling and coordination of care. Reinforced again the importance of taking medication exactly as prescribed. We discussed her improved stability this visit. Requesting no medication adjustments today.  I  encouraged her to continue to follow up with her PCP regularly due to hx of thyroid  problems.    Continue Auvelity   90-210 daily Increase  Abilify  to 5  mg by mouth daily until next visit.  To follow up in 8  weeks to reassess.  Will report any worsening symptoms or side effects promptly Provided emergency contact information to include 911, [facility], [facility], after hours number, Suicide Hotline. Discussed potential metabolic side effects associated with atypical antipsychotics, as well as potential risk for movement side effects. Advised pt to contact office if movement side effects occur.   Reviewed PDMP  [name], NP

## 2024-05-11 ENCOUNTER — Other Ambulatory Visit: Payer: Self-pay | Admitting: Behavioral Health

## 2024-05-11 DIAGNOSIS — F411 Generalized anxiety disorder: Secondary | ICD-10-CM

## 2024-05-17 ENCOUNTER — Telehealth: Payer: Self-pay | Admitting: Behavioral Health

## 2024-05-17 DIAGNOSIS — F411 Generalized anxiety disorder: Secondary | ICD-10-CM

## 2024-05-17 DIAGNOSIS — F331 Major depressive disorder, recurrent, moderate: Secondary | ICD-10-CM

## 2024-05-17 NOTE — Telephone Encounter (Signed)
 Next visit is 06/26/24. Requesting a refill on her Auvility called to:  [id number] - [place], [place] - [address]   Phone: [phone]  Fax: [phone]

## 2024-05-18 MED ORDER — AUVELITY 45-105 MG PO TBCR: EXTENDED_RELEASE_TABLET | ORAL | 0 refills | Status: DC

## 2024-05-18 NOTE — Telephone Encounter (Signed)
 Sent!

## 2024-05-19 ENCOUNTER — Other Ambulatory Visit: Payer: Self-pay | Admitting: Behavioral Health

## 2024-05-19 DIAGNOSIS — F411 Generalized anxiety disorder: Secondary | ICD-10-CM

## 2024-05-19 DIAGNOSIS — F331 Major depressive disorder, recurrent, moderate: Secondary | ICD-10-CM

## 2024-06-05 ENCOUNTER — Encounter: Payer: Self-pay | Admitting: Behavioral Health

## 2024-06-05 ENCOUNTER — Ambulatory Visit (INDEPENDENT_AMBULATORY_CARE_PROVIDER_SITE_OTHER): Admitting: Behavioral Health

## 2024-06-05 DIAGNOSIS — F3130 Bipolar disorder, current episode depressed, mild or moderate severity, unspecified: Secondary | ICD-10-CM | POA: Diagnosis not present

## 2024-06-05 DIAGNOSIS — F411 Generalized anxiety disorder: Secondary | ICD-10-CM | POA: Diagnosis not present

## 2024-06-05 DIAGNOSIS — F331 Major depressive disorder, recurrent, moderate: Secondary | ICD-10-CM

## 2024-06-05 MED ORDER — AUVELITY 45-105 MG PO TBCR: EXTENDED_RELEASE_TABLET | ORAL | 0 refills | Status: DC

## 2024-06-05 MED ORDER — ARIPIPRAZOLE 10 MG PO TABS: 10.0000 mg | ORAL_TABLET | Freq: Every day | ORAL | 3 refills | Status: DC

## 2024-06-05 NOTE — Progress Notes (Signed)
 [facility]  Patient ID: Joyce [name],  MRN: [id number]  PCP: [name], MD  Date of Evaluation: 06/05/2024 Time spent:40 minutes  Chief Complaint:   HISTORY/CURRENT STATUS: HPI  Joyce [name], 23 year old female presents to this office  for follow up and medication management.  Collateral information should be considered reliable.  Both of her parents are present today with her verbal consent.  They are concerned that her depression and anxiety levels have been paralyzing.  Says that she locked herself away in her room and does not have much interaction.  They do acknowledge stressors and friction between her and them.  Says that they try to set ground rules as long as she is living in the home and she struggles with that.  They said that they would like to get her better so she can be more independent and eventually leave the house.  [name] says that she is not financially able and cannot make enough money to leave.  That she has nowhere to go that she can afford.  Her parents agree that [name] has had periods which she has not taken medication as prescribed.  Alejandrina acknowledges that she will be diligent and take medication daily.  Agrees that we will be increasing her Abilify  today and she will switch to nighttime administration.  Report anxiety at 5/10 and depression at 5/10.   She says that she is not currently suicidal and contracts for safety. Denies Mania, No psychosis, No  current SI/HI.   No previous psychiatric medication failures.     Individual Medical History/ Review of Systems: Changes? :No   Allergies: Patient has no known allergies.  Current Medications:  Current Outpatient Medications:    ARIPiprazole  (ABILIFY ) 10 MG tablet, Take 1 tablet (10 mg total) by mouth daily., Disp: 30 tablet, Rfl: 3   ARIPiprazole  (ABILIFY ) 2 MG tablet, Take 1 tablet (2 mg total) by mouth daily., Disp: 30 tablet, Rfl: 0   ARIPiprazole  (ABILIFY ) 5 MG tablet, Take 1  tablet (5 mg total) by mouth daily., Disp: 30 tablet, Rfl: 2   benzocaine  (ORAJEL) 10 % mucosal gel, Use as directed in the mouth or throat 3 (three) times daily as needed for mouth pain. Mouth pain, Disp: 5.3 g, Rfl: 0   buPROPion  (WELLBUTRIN  XL) 300 MG 24 hr tablet, Take 1 tablet (300 mg total) by mouth daily., Disp: 30 tablet, Rfl: 0   Dextromethorphan -buPROPion  ER (AUVELITY ) 45-105 MG TBCR, Take one tablet by mouth twice daily 8 hours between doses, Disp: 180 tablet, Rfl: 0   hydrOXYzine  (ATARAX ) 25 MG tablet, TAKE 1 TABLET BY MOUTH 3 TIMES DAILY AS NEEDED FOR ANXIETY., Disp: 270 tablet, Rfl: 0   ibuprofen  (ADVIL ) 600 MG tablet, Take 1 tablet (600 mg total) by mouth every 6 (six) hours as needed for moderate pain (pain score 4-6)., Disp: 30 tablet, Rfl: 0   levothyroxine  (SYNTHROID ) 100 MCG tablet, Take 1 tablet (100 mcg total) by mouth daily at 6 (six) AM., Disp: 30 tablet, Rfl: 0 Medication Side Effects: none  Family Medical/ Social History: Changes? No  MENTAL HEALTH EXAM:  There were no vitals taken for this visit.There is no height or weight on file to calculate BMI.  General Appearance: Casual  Eye Contact:  Good  Speech:  Clear and Coherent  Volume:  Normal  Mood:  Anxious, Depressed, and Dysphoric  Affect:  Depressed, Flat, and Anxious  Thought Process:  Coherent  Orientation:  Full (Time, Place, and Person)  Thought  Content: Logical   Suicidal Thoughts:  No  Homicidal Thoughts:  No  Memory:  WNL  Judgement:  Fair  Insight:  Fair  Psychomotor Activity:  Normal  Concentration:  Concentration: Good  Recall:  Good  Fund of Knowledge: Fair  Language: Fair  Assets:  Desire for Improvement  ADL's:  Intact  Cognition: WNL  Prognosis:  Good    DIAGNOSES:    ICD-10-CM   1. Bipolar I disorder, most recent episode depressed (HCC)  F31.30 ARIPiprazole  (ABILIFY ) 10 MG tablet    2. Generalized anxiety disorder  F41.1 Dextromethorphan -buPROPion  ER (AUVELITY ) 45-105 MG TBCR     ARIPiprazole  (ABILIFY ) 10 MG tablet    3. Moderate episode of recurrent major depressive disorder (HCC)  F33.1 Dextromethorphan -buPROPion  ER (AUVELITY ) 45-105 MG TBCR      Receiving Psychotherapy: No    RECOMMENDATIONS:   Greater than 50% of 40  min video visit time with patient was spent on counseling and coordination of care.  Reinforced today with Myiesha that it was very important for her to take medications as prescribed.  Explained that we can try switching to evening administration if the medication is making her feel fatigued.  Continue Auvelity   90-210 daily Increase  Abilify  to 10  mg by mouth daily until next visit.  Would like to consider a new antidepressant next visit, possibly Pristiq. To follow up in 4 weeks to reassess.  Will report any worsening symptoms or side effects promptly Provided emergency contact information to include 911, [facility], [facility], after hours number, Suicide Hotline. Discussed potential metabolic side effects associated with atypical antipsychotics, as well as potential risk for movement side effects. Advised pt to contact office if movement side effects occur.   Reviewed PDMP   [name], NP             [name], NP

## 2024-06-06 ENCOUNTER — Telehealth: Payer: Self-pay | Admitting: Behavioral Health

## 2024-06-06 NOTE — Telephone Encounter (Signed)
 Addendum to attached message. Next visit is 07/03/24. Joyce [name] mom called stating that her Abilify  was increased from 2 mg to 10 mg. Mom doesn't think it is helping her. Mom and dad are on her DPR. She does need to complete an updated one for 2025.

## 2024-06-06 NOTE — Telephone Encounter (Signed)
 Ok, this was not what was relayed to me yesterday at all. Please cancel the 10 mg please and pend Abilify  5 mg. She can start on 1/2 tablet 2.5 mg for 3 days, then take one whole tablet 5 mg daily.

## 2024-06-06 NOTE — Telephone Encounter (Signed)
 Pt called to report that she had been previously prescribed Abilify  2 mg and she wasn't taking it regularly. She reports that is why she relapsed. She was seen yesterday and prescribed 10 mg. She is asking to start back on 2 mg and go up as needed. Will send in Rx is appropriate.

## 2024-06-06 NOTE — Telephone Encounter (Signed)
 Pt lvm @ 4:50 pm yesterday. Stated need to talk to provider about meds. RTC @ [phone]. Apt yesterday. Next apt 10/20

## 2024-06-07 ENCOUNTER — Other Ambulatory Visit: Payer: Self-pay

## 2024-06-07 MED ORDER — ARIPIPRAZOLE 5 MG PO TABS: ORAL_TABLET | ORAL | 0 refills | Status: DC

## 2024-06-07 NOTE — Telephone Encounter (Signed)
 Rx sent as requested. Tried to call patient and phone keeps ringing, no way to LM.

## 2024-06-24 ENCOUNTER — Encounter (HOSPITAL_COMMUNITY): Payer: Self-pay | Admitting: Pharmacy Technician

## 2024-06-24 ENCOUNTER — Emergency Department (HOSPITAL_COMMUNITY)
Admission: EM | Admit: 2024-06-24 | Discharge: 2024-06-24 | Discharge status: Against Medical Advice | Attending: Emergency Medicine | Admitting: Emergency Medicine

## 2024-06-24 ENCOUNTER — Other Ambulatory Visit: Payer: Self-pay

## 2024-06-24 DIAGNOSIS — Z5321 Procedure and treatment not carried out due to patient leaving prior to being seen by health care provider: Secondary | ICD-10-CM | POA: Insufficient documentation

## 2024-06-24 DIAGNOSIS — M79602 Pain in left arm: Secondary | ICD-10-CM | POA: Insufficient documentation

## 2024-06-24 NOTE — ED Triage Notes (Signed)
 Pt bib ems with reports of L arm pain. Seen by PCP 2 days ago and dx with tendonitis. Reports the pain is sharp and radiates up to her shoulder.

## 2024-06-24 NOTE — ED Notes (Signed)
 Mom is here to pick up pt. This tech walked pt to car and pt left with mom.

## 2024-06-24 NOTE — ED Notes (Signed)
 Pt states that she is leaving and going to the urgent care. Pt ambulated out of ED and mother picked her up

## 2024-07-03 ENCOUNTER — Ambulatory Visit: Admitting: Behavioral Health

## 2024-07-10 ENCOUNTER — Encounter: Payer: Self-pay | Admitting: Behavioral Health

## 2024-07-10 ENCOUNTER — Ambulatory Visit (INDEPENDENT_AMBULATORY_CARE_PROVIDER_SITE_OTHER): Payer: Self-pay | Admitting: Behavioral Health

## 2024-07-10 DIAGNOSIS — Z91199 Patient's noncompliance with other medical treatment and regimen due to unspecified reason: Secondary | ICD-10-CM

## 2024-07-10 NOTE — Progress Notes (Signed)
 Pt did not show for scheduled appt and did not provide 24 hour notice as required. Additional fees to be assessed.

## 2024-07-11 ENCOUNTER — Other Ambulatory Visit: Payer: Self-pay | Admitting: Behavioral Health

## 2024-07-11 DIAGNOSIS — F411 Generalized anxiety disorder: Secondary | ICD-10-CM

## 2024-07-11 DIAGNOSIS — F3130 Bipolar disorder, current episode depressed, mild or moderate severity, unspecified: Secondary | ICD-10-CM

## 2024-07-31 ENCOUNTER — Telehealth: Payer: Self-pay | Admitting: Behavioral Health

## 2024-07-31 NOTE — Telephone Encounter (Signed)
 Pt called requesting letter stating treated here and diagnosis. Advise $15 charge and collect prior to receiving. Reported she will come by office end of week to pay.

## 2024-08-01 NOTE — Telephone Encounter (Signed)
 Please call to schedule FU, was a no show last appt.

## 2024-08-01 NOTE — Telephone Encounter (Signed)
 Voicemail full.

## 2024-08-02 NOTE — Telephone Encounter (Signed)
 Waiting for her to schedule FU to do letter.

## 2024-08-04 NOTE — Telephone Encounter (Signed)
 Patient called into office regarding prior messages. Has appt 12/5. She also mentioned that her anxiety is getting worse. PH: [phone]

## 2024-08-07 NOTE — Telephone Encounter (Signed)
 Called, just kept ringing, no way to leave a message.

## 2024-08-08 ENCOUNTER — Other Ambulatory Visit: Payer: Self-pay | Admitting: Behavioral Health

## 2024-08-08 DIAGNOSIS — F411 Generalized anxiety disorder: Secondary | ICD-10-CM

## 2024-08-08 NOTE — Telephone Encounter (Signed)
 Would send a 90day refill #[phone].

## 2024-08-14 NOTE — Telephone Encounter (Signed)
 Pt said she can't make 12/5 appt and wants to reschedule. Please call.

## 2024-08-14 NOTE — Telephone Encounter (Signed)
 I changed appt to 12/15

## 2024-08-14 NOTE — Telephone Encounter (Signed)
 Called again and same as 11/24. That is her parent's #. Called her mobile #. Affect is very flat, speech is slow. Rates anxiety as 8/10 and depression as 7/10. Cannot tell me if any new stressors when asked, says it is sudden. No SI. She has not called out of work recently. Not able to do ADLs.  Can get to sleep but not stay asleep. Reviewed sleep hygiene and she has caffeine some in the afternoon and also screen time. No SI. She has FU scheduled for 12/5, said she is going to have to reschedule. She was a no show 10/27. Stressed the importance of keeping appts. There have been issues in the past with being compliant with medication.   Sent admin a message to call to reschedule.

## 2024-08-14 NOTE — Telephone Encounter (Signed)
 Yes, she has high tendency to stop taking medication. Important to assess her compliance.

## 2024-08-18 ENCOUNTER — Ambulatory Visit: Admitting: Behavioral Health

## 2024-08-22 NOTE — Telephone Encounter (Signed)
 Pt reports being compliant with all of her medications. Affect is very flat.

## 2024-08-28 ENCOUNTER — Encounter: Payer: Self-pay | Admitting: Behavioral Health

## 2024-08-28 ENCOUNTER — Ambulatory Visit: Admitting: Behavioral Health

## 2024-08-28 DIAGNOSIS — F411 Generalized anxiety disorder: Secondary | ICD-10-CM

## 2024-08-28 DIAGNOSIS — F3130 Bipolar disorder, current episode depressed, mild or moderate severity, unspecified: Secondary | ICD-10-CM

## 2024-08-28 MED ORDER — AUVELITY 45-105 MG PO TBCR: EXTENDED_RELEASE_TABLET | ORAL | 0 refills | Status: AC

## 2024-08-28 MED ORDER — BREXPIPRAZOLE 2 MG PO TABS: 2.0000 mg | ORAL_TABLET | Freq: Every day | ORAL | 1 refills | Status: AC

## 2024-08-28 NOTE — Progress Notes (Signed)
 [facility]  Patient ID: Joyce [name],  MRN: [id number]  PCP: [name], MD  Date of Evaluation: 08/29/2024 Time spent:30 minutes  Chief Complaint:  Chief Complaint   Depression; Anxiety; Follow-up; Medication Refill; Patient Education; Stress     HISTORY/CURRENT STATUS: HPI:  Joyce [name], 23 year old female presents to this office  for follow up and medication management.  Collateral information should be considered reliable.  Her mother is present during interview with her verbal consent.  She says, I am not feeling well.  She believes that her depression and anxiety have worsened since last visit.  She has experienced significant weight gain and would like to consider trying a different medication that is more weight neutral.  [name] says that she has taken medication as prescribed the last few weeks. She agrees to reduce her Abilify  over the next couple of weeks and will start trial of Rexulti  which has been shown to cause less weight gain over time.  Report anxiety at 7/10 and depression at 5/10.   She says that she is not currently suicidal and contracts for safety. Denies Mania, No psychosis, No  current SI/HI.   Past psychiatric medication trials: Abilify  Auvelity           Individual Medical History/ Review of Systems: Changes? :No      Allergies: Patient has no known allergies.  Current Medications: Current Medications[1] Medication Side Effects: none  Family Medical/ Social History: Changes? No  MENTAL HEALTH EXAM:  There were no vitals taken for this visit.There is no height or weight on file to calculate BMI.  General Appearance: Casual, Neat, and Well Groomed  Eye Contact:  Good  Speech:  Clear and Coherent  Volume:  Normal  Mood:  Depressed, Dysphoric, and Euphoric  Affect:  Congruent, Depressed, Flat, and Anxious  Thought Process:  Coherent  Orientation:  Full (Time, Place, and Person)  Thought Content: Logical   Suicidal  Thoughts:  No  Homicidal Thoughts:  No  Memory:  WNL  Judgement:  Good  Insight:  Good  Psychomotor Activity:  Normal  Concentration:  Concentration: Good  Recall:  Good  Fund of Knowledge: Good  Language: Good  Assets:  Desire for Improvement  ADL's:  Intact  Cognition: WNL  Prognosis:  Good    DIAGNOSES:    ICD-10-CM   1. Bipolar I disorder, most recent episode depressed (HCC)  F31.30 brexpiprazole  (REXULTI ) 2 MG TABS tablet    2. Generalized anxiety disorder  F41.1 Dextromethorphan -buPROPion  ER (AUVELITY ) 45-105 MG TBCR    brexpiprazole  (REXULTI ) 2 MG TABS tablet      Receiving Psychotherapy: No    RECOMMENDATIONS:   Greater than 50% of 30  min video visit time with patient was spent on counseling and coordination of care.  Reinforced today with Media that it was very important for her to take medications as prescribed.  We reviewed her medication and felt that due to the extensive weight gain over the last few months that it would be better for her to try a new atypical medication at this time. We agreed to: Will start Rexulti  0.5 mg for 7 days, then 1 mg for 7 days, then 2 mg daily until next visit. Continue Auvelity   90-210 daily I will decrease Abilify  to 2.5 mg for 1 week, then every other day for 1 week, then stop medication  To follow up in 4 weeks to reassess.  Will report any worsening symptoms or side effects promptly Provided emergency contact  information to include 911, [facility], [facility], after hours number, Suicide Hotline. Discussed potential metabolic side effects associated with atypical antipsychotics, as well as potential risk for movement side effects. Advised pt to contact office if movement side effects occur.   Reviewed PDMP   [name], NP                            [1]  Current Outpatient Medications:    brexpiprazole  (REXULTI ) 2 MG TABS tablet, Take 1 tablet (2 mg total) by mouth daily., Disp: 30 tablet, Rfl: 1    ARIPiprazole  (ABILIFY ) 10 MG tablet, Take 1 tablet (10 mg total) by mouth daily., Disp: 30 tablet, Rfl: 3   ARIPiprazole  (ABILIFY ) 5 MG tablet, Take 1 tablet (5 mg total) by mouth daily., Disp: 30 tablet, Rfl: 2   ARIPiprazole  (ABILIFY ) 5 MG tablet, Take 1/2 tablet for 3 days and then increase to a full tablet., Disp: 30 tablet, Rfl: 0   benzocaine  (ORAJEL) 10 % mucosal gel, Use as directed in the mouth or throat 3 (three) times daily as needed for mouth pain. Mouth pain, Disp: 5.3 g, Rfl: 0   buPROPion  (WELLBUTRIN  XL) 300 MG 24 hr tablet, Take 1 tablet (300 mg total) by mouth daily., Disp: 30 tablet, Rfl: 0   Dextromethorphan -buPROPion  ER (AUVELITY ) 45-105 MG TBCR, Take one tablet by mouth twice daily 8 hours between doses, Disp: 180 tablet, Rfl: 0   hydrOXYzine  (ATARAX ) 25 MG tablet, TAKE 1 TABLET BY MOUTH THREE TIMES A DAY AS NEEDED FOR ANXIETY, Disp: 90 tablet, Rfl: 0   ibuprofen  (ADVIL ) 600 MG tablet, Take 1 tablet (600 mg total) by mouth every 6 (six) hours as needed for moderate pain (pain score 4-6)., Disp: 30 tablet, Rfl: 0   levothyroxine  (SYNTHROID ) 100 MCG tablet, Take 1 tablet (100 mcg total) by mouth daily at 6 (six) AM., Disp: 30 tablet, Rfl: 0

## 2024-08-29 ENCOUNTER — Telehealth: Payer: Self-pay | Admitting: Behavioral Health

## 2024-08-29 NOTE — Telephone Encounter (Signed)
 Next visit is 10/02/24. Joyce [name] is on a step down program for Abilify . She is asking if she should take Rexulti  while on the step down program or after she completes step down program for Abilify ? [name]'s phone number is [phone].

## 2024-08-29 NOTE — Telephone Encounter (Signed)
 Told patient to titrate up on Rexulti  as instructed while also tapering down the Abilify .

## 2024-09-02 ENCOUNTER — Other Ambulatory Visit: Payer: Self-pay | Admitting: Behavioral Health

## 2024-09-02 DIAGNOSIS — F411 Generalized anxiety disorder: Secondary | ICD-10-CM

## 2024-09-11 ENCOUNTER — Ambulatory Visit (HOSPITAL_COMMUNITY)
Admission: EM | Admit: 2024-09-11 | Discharge: 2024-09-12 | Disposition: A | Discharge status: Home / Self Care | Attending: Psychiatry | Admitting: Psychiatry

## 2024-09-11 DIAGNOSIS — F411 Generalized anxiety disorder: Secondary | ICD-10-CM | POA: Diagnosis present

## 2024-09-11 DIAGNOSIS — F314 Bipolar disorder, current episode depressed, severe, without psychotic features: Secondary | ICD-10-CM | POA: Insufficient documentation

## 2024-09-11 DIAGNOSIS — R4689 Other symptoms and signs involving appearance and behavior: Secondary | ICD-10-CM

## 2024-09-11 DIAGNOSIS — F603 Borderline personality disorder: Secondary | ICD-10-CM | POA: Diagnosis not present

## 2024-09-11 DIAGNOSIS — Z560 Unemployment, unspecified: Secondary | ICD-10-CM | POA: Diagnosis not present

## 2024-09-11 DIAGNOSIS — R456 Violent behavior: Secondary | ICD-10-CM | POA: Diagnosis not present

## 2024-09-11 DIAGNOSIS — F319 Bipolar disorder, unspecified: Secondary | ICD-10-CM | POA: Diagnosis present

## 2024-09-11 LAB — CBC WITH DIFFERENTIAL/PLATELET
Abs Immature Granulocytes: 0.03 K/uL (ref 0.00–0.07)
Basophils Absolute: 0 K/uL (ref 0.0–0.1)
Basophils Relative: 0 %
Eosinophils Absolute: 0.1 K/uL (ref 0.0–0.5)
Eosinophils Relative: 1 %
HCT: 44.6 % (ref 36.0–46.0)
Hemoglobin: 14.2 g/dL (ref 12.0–15.0)
Immature Granulocytes: 0 %
Lymphocytes Relative: 33 %
Lymphs Abs: 3.9 K/uL (ref 0.7–4.0)
MCH: 28 pg (ref 26.0–34.0)
MCHC: 31.8 g/dL (ref 30.0–36.0)
MCV: 87.8 fL (ref 80.0–100.0)
Monocytes Absolute: 0.6 K/uL (ref 0.1–1.0)
Monocytes Relative: 5 %
Neutro Abs: 7.1 K/uL (ref 1.7–7.7)
Neutrophils Relative %: 61 %
Platelets: 284 K/uL (ref 150–400)
RBC: 5.08 MIL/uL (ref 3.87–5.11)
RDW: 15.4 % (ref 11.5–15.5)
WBC: 11.8 K/uL — ABNORMAL HIGH (ref 4.0–10.5)
nRBC: 0 % (ref 0.0–0.2)

## 2024-09-11 LAB — URINALYSIS, ROUTINE W REFLEX MICROSCOPIC
Bilirubin Urine: NEGATIVE
Glucose, UA: NEGATIVE mg/dL
Ketones, ur: NEGATIVE mg/dL
Leukocytes,Ua: NEGATIVE
Nitrite: NEGATIVE
Protein, ur: 100 mg/dL — AB
RBC / HPF: >50 RBC/hpf (ref 0–5)
Specific Gravity, Urine: 1.021 (ref 1.005–1.030)
pH: 5 (ref 5.0–8.0)

## 2024-09-11 LAB — POCT URINE DRUG SCREEN - MANUAL ENTRY (I-SCREEN)
POC Amphetamine UR: NOT DETECTED
POC Buprenorphine (BUP): NOT DETECTED
POC Cocaine UR: NOT DETECTED
POC Marijuana UR: NOT DETECTED
POC Methadone UR: NOT DETECTED
POC Methamphetamine UR: NOT DETECTED
POC Morphine: NOT DETECTED
POC Oxazepam (BZO): NOT DETECTED
POC Oxycodone UR: NOT DETECTED
POC Secobarbital (BAR): NOT DETECTED

## 2024-09-11 LAB — COMPREHENSIVE METABOLIC PANEL WITH GFR
ALT: 19 U/L (ref 0–44)
AST: 21 U/L (ref 15–41)
Albumin: 4.3 g/dL (ref 3.5–5.0)
Alkaline Phosphatase: 122 U/L (ref 38–126)
Anion gap: 12 (ref 5–15)
BUN: 8 mg/dL (ref 6–20)
CO2: 25 mmol/L (ref 22–32)
Calcium: 9.4 mg/dL (ref 8.9–10.3)
Chloride: 102 mmol/L (ref 98–111)
Creatinine, Ser: 0.89 mg/dL (ref 0.44–1.00)
GFR, Estimated: >60 mL/min
Glucose, Bld: 64 mg/dL — ABNORMAL LOW (ref 70–99)
Potassium: 3.9 mmol/L (ref 3.5–5.1)
Sodium: 139 mmol/L (ref 135–145)
Total Bilirubin: 0.4 mg/dL (ref 0.0–1.2)
Total Protein: 8.1 g/dL (ref 6.5–8.1)

## 2024-09-11 LAB — LIPID PANEL
Cholesterol: 105 mg/dL (ref 0–200)
HDL: 50 mg/dL
LDL Cholesterol: 39 mg/dL (ref 0–99)
Total CHOL/HDL Ratio: 2.1 ratio
Triglycerides: 81 mg/dL
VLDL: 16 mg/dL (ref 0–40)

## 2024-09-11 LAB — TSH: TSH: 1.42 u[IU]/mL (ref 0.350–4.500)

## 2024-09-11 LAB — ETHANOL: Alcohol, Ethyl (B): <15 mg/dL

## 2024-09-11 LAB — POC URINE PREG, ED: Preg Test, Ur: NEGATIVE

## 2024-09-11 LAB — MAGNESIUM: Magnesium: 2 mg/dL (ref 1.7–2.4)

## 2024-09-11 MED ORDER — HALOPERIDOL LACTATE 5 MG/ML IJ SOLN: 5.0000 mg | Freq: Three times a day (TID) | INTRAMUSCULAR | Status: DC | PRN

## 2024-09-11 MED ORDER — ALUM & MAG HYDROXIDE-SIMETH 200-200-20 MG/5ML PO SUSP: 30.0000 mL | ORAL | Status: DC | PRN

## 2024-09-11 MED ORDER — HYDROXYZINE HCL 25 MG PO TABS
25.0000 mg | ORAL_TABLET | Freq: Three times a day (TID) | ORAL | Status: DC | PRN
  Administered 2024-09-12: 25 mg via ORAL
  Filled 2024-09-11: qty 1

## 2024-09-11 MED ORDER — HALOPERIDOL LACTATE 5 MG/ML IJ SOLN: 10.0000 mg | Freq: Three times a day (TID) | INTRAMUSCULAR | Status: DC | PRN

## 2024-09-11 MED ORDER — ACETAMINOPHEN 325 MG PO TABS
650.0000 mg | ORAL_TABLET | Freq: Four times a day (QID) | ORAL | Status: DC | PRN
  Administered 2024-09-12: 650 mg via ORAL
  Filled 2024-09-11: qty 2

## 2024-09-11 MED ORDER — DIPHENHYDRAMINE HCL 50 MG/ML IJ SOLN: 50.0000 mg | Freq: Three times a day (TID) | INTRAMUSCULAR | Status: DC | PRN

## 2024-09-11 MED ORDER — LORAZEPAM 2 MG/ML IJ SOLN: 2.0000 mg | Freq: Three times a day (TID) | INTRAMUSCULAR | Status: DC | PRN

## 2024-09-11 MED ORDER — MAGNESIUM HYDROXIDE 400 MG/5ML PO SUSP: 30.0000 mL | Freq: Every day | ORAL | Status: DC | PRN

## 2024-09-11 MED ORDER — DIPHENHYDRAMINE HCL 50 MG PO CAPS: 50.0000 mg | ORAL_CAPSULE | Freq: Three times a day (TID) | ORAL | Status: DC | PRN

## 2024-09-11 MED ORDER — HALOPERIDOL 5 MG PO TABS: 5.0000 mg | ORAL_TABLET | Freq: Three times a day (TID) | ORAL | Status: DC | PRN
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
## 2024-09-11 NOTE — ED Notes (Signed)
 RN spoke with patient A&Ox4. Denies intent to harm self/others when asked but demeanor is very flat. Denies A/VH or any physical complaints when asked. No acute distress noted. Active listening, support and encouragement provided. Routine safety checks conducted according to facility protocol. Encouraged patient to notify staff if thoughts of harm toward self or others arise. Patient verbalize understanding and agreement.
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
## 2024-09-12 DIAGNOSIS — F603 Borderline personality disorder: Secondary | ICD-10-CM

## 2024-09-12 LAB — HEMOGLOBIN A1C
Hgb A1c MFr Bld: 5.6 % (ref 4.8–5.6)
Mean Plasma Glucose: 114.02 mg/dL

## 2024-09-12 MED ORDER — IBUPROFEN 600 MG PO TABS: 600.0000 mg | ORAL_TABLET | Freq: Four times a day (QID) | ORAL | Status: DC | PRN

## 2024-09-12 MED ORDER — BREXPIPRAZOLE 2 MG PO TABS
2.0000 mg | ORAL_TABLET | Freq: Every day | ORAL | Status: DC
  Administered 2024-09-12: 2 mg via ORAL
  Filled 2024-09-12: qty 1

## 2024-09-12 MED ORDER — LEVOTHYROXINE SODIUM 112 MCG PO TABS
112.0000 ug | ORAL_TABLET | Freq: Every morning | ORAL | Status: DC
  Administered 2024-09-12: 112 ug via ORAL
  Filled 2024-09-12: qty 1

## 2024-09-12 MED ORDER — MENTHOL 3 MG MT LOZG
1.0000 | LOZENGE | OROMUCOSAL | Status: DC | PRN
  Administered 2024-09-12: 3 mg via ORAL
  Filled 2024-09-12: qty 9

## 2024-09-12 MED ORDER — BUPROPION HCL ER (XL) 300 MG PO TB24
450.0000 mg | ORAL_TABLET | Freq: Every day | ORAL | Status: DC
  Administered 2024-09-12: 450 mg via ORAL
  Filled 2024-09-12: qty 1

## 2024-09-12 MED ORDER — HYDROXYZINE HCL 25 MG PO TABS: 25.0000 mg | ORAL_TABLET | Freq: Three times a day (TID) | ORAL | 0 refills | Status: AC | PRN

## 2024-09-12 NOTE — ED Notes (Signed)
 Pt remains quiet, depressed and anxious.  She is journaling.  Accepts meds as prescribed.

## 2024-09-12 NOTE — Discharge Instructions (Addendum)
 I think it might benefit you to work with your primary psychiatric provider to look at adding additional or alternative medications to assist with your mental health. It is possible that you might benefit from a mood-stabilizing medication such as lithium or lamotrigine. These can augment your exiting regimen and assist with depressive symptoms while reducing tendencies to harm yourself or others, but would require careful balancing.  Another option would be switching your primary antipsychotic to another one, including one like paliperidone that might help with some other symptoms.  I agree with your assessment that you need to be re-engaged with therapy, and I have some recommendations for follow-up options below. I would recommend reaching out to the partial hospitalization programs at the bottom of this list which would allow you to have a more structured program to assist with your depressive symptoms and difficulty managing impulsive behaviors. Additionally, Dialectical Behavioral Therapy (DBT) might also be very beneficial for you. I have some information about that along with a few providers who accept your insurance below.  __ For follow-up, I would recommend a therapist specializing in dialectical behavioral therapy.  I want to be clear that I am not diagnosing you with a personality disorder, however you do have some traits of emotional instability along with a deep sense of self-loathing that would benefit from dialectical behavioral therapy, which is commonly prescribed for borderline personality disorder.  The therapy has benefits outside of just that condition.  DBT for Strong Emotions Dialectical Behavioral Therapy (DBT) is a type of talk therapy that teaches practical skills to help people manage strong emotions and stressful situations. DBT was first developed to help people with borderline personality disorder, but research shows it can help anyone who struggles with intense feelings,  mood swings, or trouble coping with stress.[1][2][3][4]  How DBT Helps with Strong Reactions DBT is based on the idea that some people are more sensitive to emotions and may react more strongly to stress. These strong reactions can sometimes lead to actions that are not helpful, like saying things in anger, withdrawing from others, or even self-harm. DBT teaches skills to help notice, understand, and manage these emotions before they lead to actions that might be regretted later.[1][3][4][5][6]  What Skills Are Taught in DBT? DBT teaches four main sets of skills, each designed to help with different parts of handling emotions and stress:[1][3][4][5][6][7]  - Mindfulness: Learning to pay attention to the present moment without judgment. This helps people notice their feelings and thoughts without being overwhelmed by them. - Distress Tolerance: Techniques for getting through tough situations without making things worse. These skills include ways to calm down, distract from pain, and accept things that cannot be changed right now.[7][8] - Emotion Regulation: Strategies to better understand emotions, reduce how often strong emotions happen, and make them feel less intense. This includes learning to identify emotions, change unhelpful thoughts, and do things that make positive feelings more likely.[3][4][5][6] - Interpersonal Effectiveness: Tools for communicating needs, setting boundaries, and maintaining healthy relationships, even when emotions are running high.[1][3][5][6]  How Does DBT Work in Real Life? - DBT is usually taught in both group and individual sessions, often over several months. In group sessions, people learn and practice skills together. In individual sessions, they work on applying these skills to their own lives.[1][2][9] - Many people find that DBT skills help them feel more in control, less overwhelmed, and more confident in handling stress.[6][8][10] - For example, distress  tolerance skills can help someone ride out a  wave of anger or sadness without acting impulsively. Mindfulness can help someone pause and notice what they are feeling before reacting. Emotion regulation skills can help someone change their mood or prevent emotions from becoming too intense.[3][4][5][6][7][8][10][11]  What Do People Say About DBT? People who have used DBT often report that the skills help them feel safer, more stable, and better able to cope with difficult situations. Some skills, like opposite action (doing the opposite of what a strong emotion urges), can be challenging at first but become easier with practice and support.[6][8][10][11]  Is DBT Right for Everyone? DBT has been shown to help people with a range of mental health concerns, especially those who have trouble managing strong emotions or who react quickly to stress. It is considered a first-line treatment for borderline personality disorder, but it is also helpful for depression, anxiety, PTSD, and other conditions where emotion regulation is a challenge.[2][3][4][7]  Summary DBT teaches practical skills to help people notice, understand, and manage strong emotions and stressful events. With practice, these skills can help reduce impulsive reactions, improve relationships, and make life feel more manageable.[1][2][3][4][5][6][7][8][9][10][11]  Here is a link from PsychologyToday.com filtered to the [place] area of therapists who do DBT.  https://www.psychologytoday.com/us /therapists/27401?category=dialectical-dbt  References Practice Parameter for the Assessment and Treatment of Children and Adolescents With Suicidal Behavior. American Academy of Child and Adolescent Psychiatry. Journal of the American Academy of Child and Adolescent Psychiatry. 2001;40(7 Suppl):24S-51S. doi:10.1097/[id number]. Borderline Personality Disorder: A Review. Leichsenring F, Heim N, Leweke F, et al. JAMA. 2023;329(8):670-679.  doi:10.1001/jama.2023.0589. Group-Based DBT Skills Training Modules Are Linked to Independent and Additive Improvements in Emotion Regulation in a Heterogeneous Outpatient Sample. Wilkie SAILOR, Midkiff MF, Gerhart J, Turow RG. Psychotherapy Research : Journal of the Society for Psychotherapy Research. 2021;31(8):1001-1011. doi:10.1080/[id number]. Dialectical Behavior Therapy Skills for Transdiagnostic Emotion Dysregulation: A Pilot Randomized Controlled Trial. Neacsiu AD, Duwayne CHANG, Tess JONELLE Franklin ONEIDA Denton MM. Behaviour Research and Therapy. 2014;59:40-51. doi:10.1016/j.brat.2014.05.005. Emotion Regulation in Schema Therapy and Dialectical Behavior Therapy. Fassbinder E, Schweiger U, Martius D, Brand-de Donette MALVA Schlatter A. Frontiers in Psychology. 7983;2:8626. doi:10.3389/fpsyg.2016.01373. The Contribution of Skills to the Effectiveness of Dialectical Behavioral Therapy. Jama SHAWNEE Gale CA, Juliane LEVEE Journal of Clinical Psychology. 2022;78(12):2396-2409. doi:10.1002/jclp.76650. Online Dialectical Behavioral Therapy for Emotion Dysregulation in People With Chronic Pain: A Randomized Clinical Trial. Norman-Nott N, Briggs NE, Hesam-Shariati N, et al. JAMA Network Open. 2025;8(5):e256908. doi:10.1001/jamanetworkopen.7974.3091. How Do Patients With Borderline Personality Disorder Experience Distress Tolerance Skills in the Context of Dialectical Behavioral Therapy?-a Qualitative Study. Schaich A, Braakmann D, Rogg M, et al. PloS One. 2021;16(6):e0252403. doi:10.1371/journal.pone.9747596. Dialectical Behavior Therapy for High Suicide Risk in Individuals With Borderline Personality Disorder: A Randomized Clinical Trial and Component Analysis. Denton MM, Vivianne FAVRE, Harned MS, et al. JAMA Psychiatry. 2015;72(5):475-82. doi:10.1001/jamapsychiatry.7985.6960. How Patients With Borderline Personality Disorder Experience the Skill Opposite Action in the Context of Dialectical Behavior Therapy-a Qualitative  Study. Rogg CHRISTELLA Finas BIRCH, Schaich A, et al. Psychotherapy [place] 2021;58(4):544-556. doi:10.1037/pst0000392. Does Acceptance Lead to Change? Training in Radical Acceptance Improves Implementation of Cognitive Reappraisal. Segal O, Sher H, Aderka IM, Oswald BROCKS Behaviour Research and Therapy. 7976;835:895696. doi:10.1016/j.brat.2023.104303.  Here are several that work with your insurance: DBT Groups - Medicaid Insurance   BethCKincaid,MEd, NCC, [facility] [address], [address] [place], [place] [address] [phone] I take a humanistic view in therapy and use a variety of approaches: Cognitive-Behavioral Therapy, Psychodynamic Therapy, Dialectical Behavior Therapy, Play therapy for children, and Positive Psychology, for example . .. assuming that all  people can change, grow and take responsibility for their decisions and actions despite difficulties they have faced My goal is to help my clients learn to become confident and happy. I believe that therapy can ultimately offer peace within oneself and faith in one's ability to cope with the stresses in life if the client is willing to work for it and be open to it. Due to the pandemic I will also be providing Telehealth and Telephone visits as needed.   [facility] [place], [place] [address] [phone] Pro LGBTQQIAP/ BLM/ sex/ body positive/ally. I specialize in healing from healing the neurological response to trauma. My training, both academic and visceral experiences, have all contributed in creating an eclectic therapeutic approach that values empowerment, self-direction, confidentiality, a non-judgmental atmosphere, emotional safety, validation, dignity, and respect above as else. These components are necessary to create an environment conducive for healing. When both the client and the therapist are willing to work in collaboration towards achieving their therapeutic goals, great strides can be made in the healing process. I am  a Radio Broadcast Assistant. I have specialized training in Dialectical Behavioral Therapy/Advanced DBT, Cognitive Behavioral Therapy, Ethics When Working with Minors, Empowerment Model, Strength Based Perspective, Recovery from Addictive Illnesses, Exposure/Impact of Domestic Violence on Children, Early Onset Sexual Trauma, and Forensic Screening. Our purpose is to treat our clients in the same way in which we want to be treated. We highly value our client's ethnic/cultural/gender/sexual diversity and appreciate the fact that gender and sexuality exists on a spectrum. Self direction is sacred. We should ALL feel appreciated for exactly who we are.   Corean Bunker [facility], LCAS [address], [address] [place], [place] [address] [phone] My counseling approach includes cognitive-behavioral therapy (CBT) and dialectical behavior therapy (DBT). CBT challenges your thinking patterns and how these can influence your emotions and behaviors. DBT guides you to develop mindfulness skills, distress tolerance skills, interpersonal effectiveness skills and emotional regulation skills My goal is to create a safe and comfortable environment, which may help foster growth in self-awareness and help you feel empowered in the therapeutic process. I believe for you to make lasting progress in therapy, it is important to establish a strong therapeutic relationship through genuine understanding, empathy and acceptance.    ..  Based on the information that you have provided and the presenting issues outpatient services and resources for have been recommended.  It is imperative that you follow through with treatment recommendations within 5-7 days from the of discharge to mitigate further risk to your safety and mental well-being. A list of referrals has been provided below to get you started.  You are not limited to the list provided.  In case of an urgent crisis, you may contact the Mobile Crisis Unit with  [facility] at 1.4580958276.  PHP - Partial Hospitalization Program in [place]  Partial Hospitalization Programs  Partial hospitalization is intended for people suffering from mental illness as an alternative or step down from  inpatient hospitalization. Our program focuses on patients who may be experiencing psychosis, depression or  other aggravated symptoms of their mental illness. PHP attempts to provide interactive therapeutic treatment that  seeks to improve social functioning, recovery, and overall health and wellness.   FEATURES OF THE PROGRAM INCLUDE:   Individualized patient-centered planning to help you achieve your personal goals   Group psychotherapy (Art therapy & Subtle yoga techniques)  Psycho-educational classes   Dealer, Registered Nurse, Recreational Therapist, Licensed Therapist  Occupational therapy focused on behavioral health   Family sessions   Linkage to community programs to help your recovery  [facility] [address] [place], [place] [address]  [phone]  [facility] [address] [place], [place] [address]  [phone]  [facility]  [address] [place], [place] [address]  [phone]  [facility] [address] [place], [place] [address]  [phone]
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
## 2024-09-12 NOTE — ED Notes (Signed)
Patient sleeping with no s/s of distress.

## 2024-10-02 ENCOUNTER — Ambulatory Visit: Admitting: Behavioral Health

## 2024-10-06 ENCOUNTER — Encounter: Payer: Self-pay | Admitting: Psychology

## 2024-10-06 ENCOUNTER — Ambulatory Visit: Admitting: Psychology

## 2024-10-06 DIAGNOSIS — F3181 Bipolar II disorder: Secondary | ICD-10-CM

## 2024-10-06 NOTE — Progress Notes (Signed)
 SABRA

## 2024-10-06 NOTE — Progress Notes (Signed)
 [facility] Counselor Initial Adult Exam  Name: Joyce [name] Date: 10/06/2024 MRN: [id number] DOB: 2000-11-08 PCP: [name], MD  Time spent: 3:00 - 3:55 pm  Guardian/Informant:   [name] - Patient    Paperwork requested: No  Met with patient for initial interview.  Patient was at home and session was conducted from therapist's office via video conferencing.  Patient expressed understanding of the limitations of video sessions and verbally consented to telehealth.    Reason for Visit /Presenting Problem: Referred for neurodevelopmental evaluation.  Patient reported that she was seeing a therapist about her lacking of certain emotions or feelings toward people.  She recently went on a date where she felt happy about going on the date but felt neutral toward him including lacking and sexual feelings and feeling like his attention was overwhelming to her.  Also reported having recent mood swings and not knowing how to handle them.  The mood swings are triggered by stress (told to do something that she doesn't want to do) or when mother has been short with her.    Mental Status Exam: Appearance:   Casual and Fairly Groomed     Behavior:  Appropriate and Sharing  Motor:  Normal  Speech/Language:   Clear and Coherent and Normal Rate  Affect:  Constricted and Flat  Mood:  euthymic  Thought process:  normal  Thought content:    WNL  Sensory/Perceptual disturbances:    WNL  Orientation:  oriented to person, place, time/date, and situation  Attention:  Good  Concentration:  Good  Memory:  WNL  Fund of knowledge:   Good  Insight:    Fair  Judgment:   Good  Impulse Control:  Good   Developmental History: Early delays - during childhood, had difficulty understanding people's emotions, including others' receptiveness to her.  It took extra explaining for her to understand other people's feelings and intentions.   Motor - Good movement and coordination.  No sports  or physical activity.  Fine motor reported to be typical.  Likes to draw and paint.     Speech - Can speak clearly most times, but tends to speak quickly and stumble on words when upset.  Participated in speech therapy during childhood.   Self Care - Inconsistent keeping up with hygiene.  May be depression or attention related.   Independent - Typical - can support self. Social - More social than during childhood (would not speak to unfamiliar people).  Now tries to speak to unfamiliar but it can still be difficult.  Previously had close friends during high school but does not talk to them as much as she used to.  Family relations are strained.  Speaks less with brother and father.      Reported Symptoms:  Sleep is inconsistent.  Sometimes has trouble falling asleep.  May be anxiety related.  Less appetite recently due to taking Zepbound for weight management.  Typical energy during the day.  Occasional sluggishness.  Periods of prolonged sadness/depression.  Last time a few days ago after an argument with her parents.  Didn't want to be in the same house with them but couldn't leave.  No mania.  Has desire to go out and do activity but doesn't know what to do with self.  Has sudden anxiety with some panic attacks within the past few weeks.  Triggered when patient alone and is aware of the excessive worries in her mind.  Feels like letting people down  and feeling like a failure.  Fears ending up alone and by self forever.  Has general worry and some social anxiety.  Some intrusive thought.  Compulsive collecting, buying random items.  Some trouble paying attention, easily distracted, frequent losing and forgetting.  Poor organization.  Some impulsive behavior or emotional reactivity.  Gets along adequately with peers, but mother believes that patient gets along best with those who are slightly older than her.  Some difficulty understanding social-emotional cues.  Inconsistent with jokes, understand sarcasm.   No current close friendships.  Not since high school.  Shakes leg repeatedly, even when not stressed.  Some overly intense interests (artwork/cartoons).  People don't typically understand her passion for this.  Adaptation to change is inconsistent.  Upset that doesn't see family as much as she used to.  Overly sensitive to touch (rough and slimy textures).  Wouldn't wear certain clothes if scratchy.          Risk Assessment: Danger to Self:  No Self-injurious Behavior: No Danger to Others: No Duty to Warn:no Physical Aggression / Violence:No  Access to Firearms a concern: No  Gang Involvement:No  Patient / guardian was educated about steps to take if suicide or homicide risk level increases between visits: n/a While future psychiatric events cannot be accurately predicted, the patient does not currently require acute inpatient psychiatric care and does not currently meet [place]  involuntary commitment criteria.  Substance Abuse History: Current substance abuse: No     Past Psychiatric History:   Previous psychological history is significant for Bipolar and depression Outpatient Providers:No currently seeing anyone for psychotherapy.  Last time was one year ago.  Had trouble finding time to attend regularly.  Sees Dr. Teresa for Psychiatry.      History of Psych Hospitalization: Yes  last time was 2-3 weeks ago, following an argument wither her father and grandmother. Patient wanted to leave the house but grandmother tried to block her so she pushed her aside.  Father tired to intervene and the ensuing conflict lead to her hospitalization.  Stayed there for two days.  No thoughts of suicide.   Psychological Testing: None   Abuse History:  Victim of: No., Intense emotional distress related to verbal harrassment from family.     Report needed: No. Victim of Neglect:No. Perpetrator of None  Witness / Exposure to Domestic Violence: No   Protective Services Involvement: No  Witness to  [employer] Violence:  No   Family History:  Family History  Problem Relation Age of Onset   Anxiety disorder Mother    Depression Mother    Hypertension Mother    Depression Father    Healthy Father    Drug abuse Brother    Hypertension Paternal Grandmother    Diabetes Paternal Grandmother   Parents both bipolar.   Living situation: the patient  lives with their family (parents).  Has an older brother (35's) who lives independently.  Strained relations with family.  Conflicts revolve around religion (mother Kalvin witness).  Mother and her side of the family shut patient out from their group.    Sexual Orientation: Straight  Relationship Status: single  Name of spouse / other:None - Recently had her first experience dating one year ago and it felt very uncomfortable to her.  Patient researched this and she feels like she is asexual (parents don't understand this).   If a parent, number of children / ages:None  Support Systems: Mother   Financial Stress:  Yes  - most  of the money she makes goes to a joint bank account with her father so he has control over how much money she has.  He leaves her with little money.  Father and grandmother do not want her to have her own bank account until she can learn to control her spending.    Income/Employment/Disability: Employment Advertising Copywriter at [employer].  Been working there two months.  Job going well overall.  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  No difficulty with previous jobs.  Patient took this job to get more hours/pay.         Military Service: No   Educational History: Education: some college [employer] and [employer] college for [employer].  Had wanted to go back to school for art (not child care) but couldn't do so for financial reasons.    Religion/Sprituality/World View: Sherlean - mother is Kalvin witness which causes much conflict.    Any cultural differences that  may affect / interfere with treatment:  Black/AA  Recreation/Hobbies: Watch You tube videos, draw, look at her art.    Stressors: Financial difficulties   Marital or family conflict    Strengths: Art, works well with children.     Barriers:  Family - When patient wants to do something different with her life family intervenes and tells her to focus on more practical activities.      Legal History: Pending legal issue / charges: The patient has no significant history of legal issues.  Medical History/Surgical History: reviewed Past Medical History:  Diagnosis Date   Constipation    Hypopituitarism    Left foot drop    Pre-diabetes    Thyroid  disease   No current medical conditions other than hypothyroid.  Past Surgical History:  Procedure Laterality Date   CHOLECYSTECTOMY N/A 01/14/2022   Procedure: LAPAROSCOPIC CHOLECYSTECTOMY;  Surgeon: Lyndel Deward PARAS, MD;  Location: [facility] OR;  Service: General;  Laterality: N/A;    Medications: Current Outpatient Medications  Medication Sig Dispense Refill   brexpiprazole  (REXULTI ) 2 MG TABS tablet Take 1 tablet (2 mg total) by mouth daily. 30 tablet 1   Dextromethorphan -buPROPion  ER (AUVELITY ) 45-105 MG TBCR Take one tablet by mouth twice daily 8 hours between doses 180 tablet 0   hydrOXYzine  (ATARAX ) 25 MG tablet Take 1 tablet (25 mg total) by mouth 3 (three) times daily as needed for anxiety. 30 tablet 0   ibuprofen  (ADVIL ) 600 MG tablet Take 1 tablet (600 mg total) by mouth every 6 (six) hours as needed for moderate pain (pain score 4-6). 30 tablet 0   levothyroxine  (SYNTHROID ) 112 MCG tablet Take 112 mcg by mouth every morning.     ZEPBOUND 2.5 MG/0.5ML Pen Inject 2.5 mg into the skin every Sunday.     No current facility-administered medications for this visit.  Current Zepbound, levothyroxine , Auvelity , Rexulti , and hydroxyzine .   Allergies[1], no digestive problems, no concussions seizures or head injuries.     Diagnoses:   Bipolar II disorder (HCC)  Plan of Care: Patient presents with a history of social interaction difficulty and emotional volatility.  She has been previously diagnosed with bipolar disorder, which her parents have been diagnosed with as well.  Her volatility and poor impulse control have kept from leading an independent life and she currently lives at home with her parents, who control her money due to compulsive and impulsive spending.  Patient was referred for evaluation by a former therapist due to difficulty  with social interaction and emotional connectivity along with overly intense interests, trouble coping with change, and sensory hypersensitivity. Testing recommended to evaluate for Autism spectrum disorder along with other conditions that may be affecting social interaction, independence and behavior.    Test Battery  K-BIT-2R, CNSVS, BRIEF-2, CAARS-2 (S & O), PAI, ADOS 2 Module 4, SRS-2 (S & O).   Jarvis Sawa, PhD        [1] No Known Allergies  "

## 2024-10-11 ENCOUNTER — Encounter: Payer: Self-pay | Admitting: Psychology

## 2024-10-11 ENCOUNTER — Ambulatory Visit: Admitting: Psychology

## 2024-10-11 DIAGNOSIS — F411 Generalized anxiety disorder: Secondary | ICD-10-CM

## 2024-10-11 DIAGNOSIS — F3181 Bipolar II disorder: Secondary | ICD-10-CM

## 2024-10-11 NOTE — Progress Notes (Signed)
"    [facility] Counselor/Therapist Progress Note  Patient ID: Joyce [name], MRN: [id number],    Date: 10/11/2024  Time Spent: 12:30 - 3:00pm   Treatment Type: Testing  Met with patient for testing session.  Patient was at the clinic and session was conducted from therapist's office in person.    Reported Symptoms/Reason for Referral: Patient presents with a history of social interaction difficulty and emotional volatility. She has been previously diagnosed with bipolar disorder, which her parents have been diagnosed with as well. Her volatility and poor impulse control have kept from leading an independent life and she currently lives at home with her parents, who control her money due to compulsive and impulsive spending. Patient was referred for evaluation by a former therapist due to difficulty with social interaction and emotional connectivity along with overly intense interests, trouble coping with change, and sensory hypersensitivity. Testing recommended to evaluate for Autism spectrum disorder along with other conditions that may be affecting social interaction, independence and behavior.   Mental Status Exam: Appearance:  Casual and Fairly Groomed     Behavior: Appropriate  Motor: Normal  Speech/Language:  Clear and Coherent and Normal Rate  Affect: Constricted/flat  Mood: anxious  Thought process: normal  Thought content:   WNL  Sensory/Perceptual disturbances:   WNL  Orientation: oriented to person, place, time/date, and situation  Attention: Good  Concentration: fair  Memory: WNL  Fund of knowledge:  Fair  Insight:   Fair  Judgment:  Good  Impulse Control: Good   Risk Assessment: Danger to Self:  No Self-injurious Behavior: No Danger to Others: No Duty to Warn:no Physical Aggression / Violence:No   Subjective: Testing included the K-BIT 2R (0.75 hrs. for testing and scoring) along with the CNS Vital signs (0.75 hrs.), PAI (0.5 hrs.), BRIEF-2 (0.25  hrs.) and CAARS-2 (0.25 hrs.).  Parents were sent rating forms to complete for next session.     Patient was cooperative and displayed good effort. Attention and concentration were adequate overall, although patient exhibited several instances of asking questions to be repeated, and missing relatively easy problems.  She appeared to misunderstand the instructions at times during computerized testing and her resposnes on rating scales were deemed overly negative by validity scales.  Mood was euthymic with appropriate affect.  The results appear representative of current functioning.    Total time for testing: 2.5 hrs.  Diagnosis:Bipolar II disorder (HCC)  Generalized anxiety disorder  Plan: Testing to continue next session with the ADOS 2 Module 4 and SRS-2 followed by report writing and interactive feedback.     Jacoya Bauman, PhD    "

## 2024-10-11 NOTE — Progress Notes (Signed)
   Joyce Dames, PhD

## 2024-10-12 ENCOUNTER — Ambulatory Visit: Admitting: Psychology

## 2024-10-12 ENCOUNTER — Encounter: Payer: Self-pay | Admitting: Psychology

## 2024-10-12 DIAGNOSIS — F3181 Bipolar II disorder: Secondary | ICD-10-CM

## 2024-10-12 DIAGNOSIS — F411 Generalized anxiety disorder: Secondary | ICD-10-CM

## 2024-10-12 NOTE — Progress Notes (Signed)
"    [facility] Counselor/Therapist Progress Note  Patient ID: Joyce [name], MRN: [id number],    Date: 10/12/2024  Time Spent: 8:30 - 10:30 am   Treatment Type: Testing  Met with patient for testing session.  Patient was at the clinic and session was conducted from therapist's office in person.    Reported Symptoms/Reason for Referral: Patient presents with a history of social interaction difficulty and emotional volatility. She has been previously diagnosed with bipolar disorder, which her parents have been diagnosed with as well. Her volatility and poor impulse control have kept from leading an independent life and she currently lives at home with her parents, who control her money due to compulsive and impulsive spending. Patient was referred for evaluation by a former therapist due to difficulty with social interaction and emotional connectivity along with overly intense interests, trouble coping with change, and sensory hypersensitivity. Testing recommended to evaluate for Autism spectrum disorder along with other conditions that may be affecting social interaction, independence and behavior.   Mental Status Exam: Appearance:  Casual and Fairly Groomed     Behavior: Appropriate  Motor: Normal  Speech/Language:  Clear and Coherent and Normal Rate  Affect: Constricted/flat  Mood: anxious  Thought process: normal  Thought content:   WNL  Sensory/Perceptual disturbances:   WNL  Orientation: oriented to person, place, time/date, and situation  Attention: Good  Concentration: fair  Memory: WNL  Fund of knowledge:  Fair  Insight:   Fair  Judgment:  Good  Impulse Control: Good   Risk Assessment: Danger to Self:  No Self-injurious Behavior: No Danger to Others: No Duty to Warn:no Physical Aggression / Violence:No   Subjective: Testing included the ADOS 2 Module 4 (1.75 hrs. for testing and scoring) along with the SRS-2 Vital signs (0.25 hrs.).  Parents completed the  rating forms online prior to the session.     Patient was cooperative and displayed good effort. Attention and concentration were adequate overall, although patient exhibited several instances of asking questions to be repeated, and missing relatively easy problems.  She appeared to misunderstand the instructions at times during computerized testing and her resposnes on rating scales were deemed overly negative by validity scales.  Patient did not initiate social interaction and responded briefly to social initiations from the examiner.  Mood was mildly anxious with restricted affect.  The results appear representative of current functioning.    Total time for testing: 2.0 hrs.  Diagnosis:Bipolar II disorder (HCC)  Generalized anxiety disorder  Plan: Testing complete. Report writing to be conducted followed by interactive feedback next session.   [name], PhD                  "

## 2024-10-19 ENCOUNTER — Encounter: Payer: Self-pay | Admitting: Psychology

## 2024-10-19 NOTE — Progress Notes (Signed)
 Joyce [name] is a 24 y.o. female patient Report writing competed ( 3 hrs.).  Interactive feedback to be conducted next session. Report to be attached to the feedback progress note.  Patient/Guardian was advised Release of Information must be obtained prior to any record release in order to collaborate their care with an outside provider. Patient/Guardian was advised if they have not already done so to contact the registration department to sign all necessary forms in order for us  to release information regarding their care.   Consent: Patient/Guardian gives verbal consent for treatment and assignment of benefits for services provided during this visit. Patient/Guardian expressed understanding and agreed to proceed.    Tyheim Vanalstyne, PhD

## 2024-10-24 ENCOUNTER — Ambulatory Visit: Admitting: Behavioral Health

## 2024-11-01 ENCOUNTER — Ambulatory Visit: Admitting: Psychology
